# Patient Record
Sex: Male | Born: 1960
Health system: Southern US, Community
[De-identification: ages and names within clinical notes are randomized; demographics above are authoritative.]

## PROBLEM LIST (undated history)

## (undated) DIAGNOSIS — B9562 Methicillin resistant Staphylococcus aureus infection as the cause of diseases classified elsewhere: Secondary | ICD-10-CM

## (undated) DIAGNOSIS — I10 Essential (primary) hypertension: Secondary | ICD-10-CM

## (undated) DIAGNOSIS — L0390 Cellulitis, unspecified: Secondary | ICD-10-CM

## (undated) HISTORY — DX: Essential (primary) hypertension: I10

## (undated) HISTORY — DX: Methicillin resistant Staphylococcus aureus infection as the cause of diseases classified elsewhere: B95.62

## (undated) HISTORY — DX: Methicillin resistant Staphylococcus aureus infection as the cause of diseases classified elsewhere: L03.90

## (undated) HISTORY — PX: ROTATOR CUFF REPAIR: SHX139

---

## 2001-08-10 ENCOUNTER — Ambulatory Visit (HOSPITAL_COMMUNITY): Admission: RE | Admit: 2001-08-10 | Discharge: 2001-08-10 | Payer: Self-pay

## 2002-10-17 ENCOUNTER — Encounter: Admission: RE | Admit: 2002-10-17 | Discharge: 2002-10-30 | Payer: Self-pay | Admitting: Family Medicine

## 2010-05-15 ENCOUNTER — Other Ambulatory Visit: Payer: Self-pay | Admitting: Family Medicine

## 2010-05-15 DIAGNOSIS — R7401 Elevation of levels of liver transaminase levels: Secondary | ICD-10-CM

## 2010-05-26 ENCOUNTER — Ambulatory Visit
Admission: RE | Admit: 2010-05-26 | Discharge: 2010-05-26 | Disposition: A | Discharge status: Home / Self Care | Payer: BC Managed Care – PPO | Source: Ambulatory Visit | Attending: Family Medicine | Admitting: Family Medicine

## 2010-05-26 DIAGNOSIS — R7401 Elevation of levels of liver transaminase levels: Secondary | ICD-10-CM

## 2011-07-22 LAB — LIPID PANEL
CHOLESTEROL: 187 mg/dL (ref 0–200)
HDL: 55 mg/dL (ref 35–70)
LDL Cholesterol: 107 mg/dL
TRIGLYCERIDES: 92 mg/dL (ref 40–160)

## 2011-07-22 LAB — CBC AND DIFFERENTIAL
HEMATOCRIT: 44 % (ref 41–53)
HEMOGLOBIN: 14.8 g/dL (ref 13.5–17.5)
WBC: 5 10^3/mL

## 2011-07-22 LAB — BASIC METABOLIC PANEL
BUN: 18 mg/dL (ref 4–21)
Creatinine: 1 mg/dL (ref 0.6–1.3)
Glucose: 104 mg/dL
Potassium: 4.5 mmol/L (ref 3.4–5.3)
Sodium: 137 mmol/L (ref 137–147)

## 2011-07-22 LAB — TSH: TSH: 3.17 u[IU]/mL (ref 0.41–5.90)

## 2011-07-22 LAB — HEPATIC FUNCTION PANEL: ALT: 100 U/L — AB (ref 10–40)

## 2012-02-29 LAB — HEPATIC FUNCTION PANEL
ALK PHOS: 48 U/L (ref 25–125)
ALT: 100 U/L — AB (ref 10–40)
AST: 65 U/L — AB (ref 14–40)
BILIRUBIN, TOTAL: 0.6 mg/dL

## 2012-02-29 LAB — BASIC METABOLIC PANEL: GLUCOSE: 84 mg/dL

## 2012-11-02 LAB — TSH: TSH: 3.88 u[IU]/mL (ref 0.41–5.90)

## 2012-11-02 LAB — CBC AND DIFFERENTIAL
HCT: 43 % (ref 41–53)
HEMOGLOBIN: 14.9 g/dL (ref 13.5–17.5)
Platelets: 318 10*3/uL (ref 150–399)
WBC: 6.1 10^3/mL

## 2012-11-02 LAB — BASIC METABOLIC PANEL
BUN: 16 mg/dL (ref 4–21)
CREATININE: 1.1 mg/dL (ref 0.6–1.3)
GLUCOSE: 118 mg/dL
POTASSIUM: 4.1 mmol/L (ref 3.4–5.3)
SODIUM: 131 mmol/L — AB (ref 137–147)

## 2012-11-02 LAB — HEPATIC FUNCTION PANEL
ALT: 100 U/L — AB (ref 10–40)
AST: 100 U/L — AB (ref 14–40)
Alkaline Phosphatase: 63 U/L (ref 25–125)
Bilirubin, Total: 1.5 mg/dL

## 2012-11-02 LAB — LIPID PANEL
CHOLESTEROL: 209 mg/dL — AB (ref 0–200)
HDL: 76 mg/dL — AB (ref 35–70)
LDL Cholesterol: 109 mg/dL
Triglycerides: 122 mg/dL (ref 40–160)

## 2014-04-30 LAB — BASIC METABOLIC PANEL
BUN: 17 mg/dL (ref 4–21)
CREATININE: 1.2 mg/dL (ref 0.6–1.3)
GLUCOSE: 106 mg/dL
POTASSIUM: 4.7 mmol/L (ref 3.4–5.3)
SODIUM: 137 mmol/L (ref 137–147)

## 2014-04-30 LAB — LIPID PANEL
CHOLESTEROL: 177 mg/dL (ref 0–200)
HDL: 39 mg/dL (ref 35–70)
LDL Cholesterol: 118 mg/dL
Triglycerides: 98 mg/dL (ref 40–160)

## 2014-04-30 LAB — HEPATIC FUNCTION PANEL
ALT: 100 U/L — AB (ref 10–40)
AST: 80 U/L — AB (ref 14–40)
Alkaline Phosphatase: 81 U/L (ref 25–125)
Bilirubin, Total: 0.6 mg/dL

## 2014-04-30 LAB — HEMOGLOBIN A1C: HEMOGLOBIN A1C: 5.5

## 2015-04-23 DIAGNOSIS — L728 Other follicular cysts of the skin and subcutaneous tissue: Secondary | ICD-10-CM | POA: Diagnosis not present

## 2015-04-23 DIAGNOSIS — L080 Pyoderma: Secondary | ICD-10-CM | POA: Diagnosis not present

## 2015-06-11 DIAGNOSIS — L080 Pyoderma: Secondary | ICD-10-CM | POA: Diagnosis not present

## 2015-07-16 DIAGNOSIS — A4902 Methicillin resistant Staphylococcus aureus infection, unspecified site: Secondary | ICD-10-CM | POA: Diagnosis not present

## 2015-08-26 ENCOUNTER — Encounter: Payer: Self-pay | Admitting: Infectious Diseases

## 2015-08-26 ENCOUNTER — Ambulatory Visit (INDEPENDENT_AMBULATORY_CARE_PROVIDER_SITE_OTHER): Payer: BLUE CROSS/BLUE SHIELD | Admitting: Infectious Diseases

## 2015-08-26 DIAGNOSIS — A4902 Methicillin resistant Staphylococcus aureus infection, unspecified site: Secondary | ICD-10-CM

## 2015-08-26 DIAGNOSIS — I10 Essential (primary) hypertension: Secondary | ICD-10-CM

## 2015-08-26 MED ORDER — MUPIROCIN 2 % EX OINT: TOPICAL_OINTMENT | Freq: Two times a day (BID) | CUTANEOUS | Status: DC

## 2015-08-26 MED ORDER — DOXYCYCLINE HYCLATE 100 MG PO TABS: 100.0000 mg | ORAL_TABLET | Freq: Two times a day (BID) | ORAL | Status: AC

## 2015-08-26 NOTE — Assessment & Plan Note (Signed)
He appears to be doing well.  He has changes consistent with acne on his back- we discussed this.  With regards to his MRSA colonization, he will take mupirocin for the first 5 days of every month for the next 6 months, intranasal.  Will give him 2 weeks of doxy (he is aware of sunburn precautions).  I advised him to use a good antibacterial soap (safegaurd, lever 2000, ect). We could consider hibiclens however it may be a bit too strong. Could reconsider if he does not improve.  We spoke about not sharing towels, linens.  We spoke of good hygeine.  We spoke of being careful re: hand hygeine, sharing linens in the gym.  We spoke of washing his clothes in hot water.  We spoke about possible systemic involvement- he has no evidence of this (no fever or chills, his shoulder is working normally and there is no issues on exam- no crepitance, pain or limitation of motion). I offered BCx but let him know the likelihood of this was extremely low, he defered.  Will see him back as needed.

## 2015-08-26 NOTE — Progress Notes (Signed)
   Subjective:    Patient ID: Keith Bullock, male    DOB: 08/26/1960, 55 y.o.   MRN: [id number]  HPI 55 yo M with hx of L shoulder surgery 2.5 years ago.  Developed acne after this (forehead and back).  He had a MRSA abscess lanced around his beltline march/april 04-25-15. He took doxy for 2 weeks. Afte3r stopping this the acne returned. He then took doxy/rifampin for 2 weeks and had return of acne. He then took clinda/doxy for 2 weeks which gave him further improvement. He has been off this since June 11.  Soap- uses shampoo.  Was given mupirocin, used nightly. Has been off for several weeks.  No recent med changes. . Has 2 cats and a dog for many years. No new lotions, cream, sunscreen.   PMHx- denies (note HTN) Allergies- none Soc- 2-3 drinks/day. No tobacco.  FHx-Father died of colon Ca at 42 yo Apr 25, 1995), Mother died from ovarian caner 04/24/00).  Has had colonoscopy, 5 yrs.   The past medical history, family history and social history were reviewed/updated in EPIC  Review of Systems  Constitutional: Negative for appetite change, chills, fever and unexpected weight change.  Respiratory: Negative for cough and shortness of breath.   Gastrointestinal: Negative for constipation and diarrhea.  Genitourinary: Negative for difficulty urinating.  Musculoskeletal: Negative for joint swelling.   Shoulder is fine, no problems. Normal ROM.     Objective:   Physical Exam  Constitutional: He appears well-developed and well-nourished.  HENT:  Mouth/Throat: No oropharyngeal exudate.  Eyes: EOM are normal. Pupils are equal, round, and reactive to light.  Neck: Neck supple.  Cardiovascular: Normal rate, regular rhythm and normal heart sounds.   Pulmonary/Chest: Effort normal and breath sounds normal.  Abdominal: Soft. Bowel sounds are normal. There is no tenderness. There is no rebound.  Musculoskeletal: He exhibits no edema.  Lymphadenopathy:    He has no cervical adenopathy.  Skin: Skin is  warm and dry.             Assessment & Plan:

## 2015-08-27 ENCOUNTER — Other Ambulatory Visit: Payer: Self-pay | Admitting: *Deleted

## 2015-10-09 DIAGNOSIS — K635 Polyp of colon: Secondary | ICD-10-CM | POA: Diagnosis not present

## 2015-10-09 DIAGNOSIS — K5900 Constipation, unspecified: Secondary | ICD-10-CM | POA: Diagnosis not present

## 2015-10-22 DIAGNOSIS — L080 Pyoderma: Secondary | ICD-10-CM | POA: Diagnosis not present

## 2015-10-22 DIAGNOSIS — L089 Local infection of the skin and subcutaneous tissue, unspecified: Secondary | ICD-10-CM | POA: Diagnosis not present

## 2015-10-22 DIAGNOSIS — L739 Follicular disorder, unspecified: Secondary | ICD-10-CM | POA: Diagnosis not present

## 2015-11-13 DIAGNOSIS — K640 First degree hemorrhoids: Secondary | ICD-10-CM | POA: Diagnosis not present

## 2015-11-13 DIAGNOSIS — R194 Change in bowel habit: Secondary | ICD-10-CM | POA: Diagnosis not present

## 2015-11-13 DIAGNOSIS — Z86010 Personal history of colonic polyps: Secondary | ICD-10-CM | POA: Diagnosis not present

## 2015-11-13 DIAGNOSIS — Z800 Family history of malignant neoplasm of digestive organs: Secondary | ICD-10-CM | POA: Diagnosis not present

## 2015-11-13 LAB — HM COLONOSCOPY

## 2016-01-07 ENCOUNTER — Encounter: Payer: Self-pay | Admitting: Family Medicine

## 2016-01-07 ENCOUNTER — Ambulatory Visit (INDEPENDENT_AMBULATORY_CARE_PROVIDER_SITE_OTHER): Payer: BLUE CROSS/BLUE SHIELD | Admitting: Family Medicine

## 2016-01-07 VITALS — BP 139/88 | HR 95 | Ht 70.0 in | Wt 214.2 lb

## 2016-01-07 DIAGNOSIS — N521 Erectile dysfunction due to diseases classified elsewhere: Secondary | ICD-10-CM

## 2016-01-07 DIAGNOSIS — E785 Hyperlipidemia, unspecified: Secondary | ICD-10-CM | POA: Insufficient documentation

## 2016-01-07 DIAGNOSIS — E669 Obesity, unspecified: Secondary | ICD-10-CM

## 2016-01-07 DIAGNOSIS — R972 Elevated prostate specific antigen [PSA]: Secondary | ICD-10-CM | POA: Diagnosis not present

## 2016-01-07 DIAGNOSIS — E781 Pure hyperglyceridemia: Secondary | ICD-10-CM | POA: Diagnosis not present

## 2016-01-07 DIAGNOSIS — A4902 Methicillin resistant Staphylococcus aureus infection, unspecified site: Secondary | ICD-10-CM

## 2016-01-07 DIAGNOSIS — R6882 Decreased libido: Secondary | ICD-10-CM | POA: Insufficient documentation

## 2016-01-07 DIAGNOSIS — Z8739 Personal history of other diseases of the musculoskeletal system and connective tissue: Secondary | ICD-10-CM

## 2016-01-07 DIAGNOSIS — F1010 Alcohol abuse, uncomplicated: Secondary | ICD-10-CM | POA: Insufficient documentation

## 2016-01-07 DIAGNOSIS — Z87828 Personal history of other (healed) physical injury and trauma: Secondary | ICD-10-CM | POA: Insufficient documentation

## 2016-01-07 DIAGNOSIS — M791 Myalgia, unspecified site: Secondary | ICD-10-CM

## 2016-01-07 DIAGNOSIS — E782 Mixed hyperlipidemia: Secondary | ICD-10-CM | POA: Diagnosis not present

## 2016-01-07 DIAGNOSIS — T466X5A Adverse effect of antihyperlipidemic and antiarteriosclerotic drugs, initial encounter: Secondary | ICD-10-CM | POA: Insufficient documentation

## 2016-01-07 DIAGNOSIS — N529 Male erectile dysfunction, unspecified: Secondary | ICD-10-CM | POA: Insufficient documentation

## 2016-01-07 DIAGNOSIS — I10 Essential (primary) hypertension: Secondary | ICD-10-CM

## 2016-01-07 MED ORDER — TRAMADOL HCL 50 MG PO TABS: 50.0000 mg | ORAL_TABLET | Freq: Four times a day (QID) | ORAL | 0 refills | Status: DC | PRN

## 2016-01-07 MED ORDER — CYCLOBENZAPRINE HCL 10 MG PO TABS: 10.0000 mg | ORAL_TABLET | Freq: Three times a day (TID) | ORAL | 0 refills | Status: DC | PRN

## 2016-01-07 NOTE — Assessment & Plan Note (Signed)
Prostate BX doen earlier this year- was negative.  2-3 times at night he goes--> Urologist txing him.  Didn't think he needed meds for this.

## 2016-01-07 NOTE — Assessment & Plan Note (Addendum)
Played golf yesterday - 2-3 hrs after end of game--> muscles tight in R back -radiating around to front- anterior R chest wall pain.  Worst this am when got out of bed.  Hurts to lift grandson.  No L sided and not worse with walking/ exertion- just use of muscles make it W.  - In past prior PCP gave him tramadol and never used m. Relaxors.  Worked well---> only needed #30 of tramadol lasted him all yr per pt.

## 2016-01-07 NOTE — Assessment & Plan Note (Signed)
Sildenafil by Urology - pt has not gone back for f/up

## 2016-01-07 NOTE — Progress Notes (Signed)
New patient office visit note:  Impression and Recommendations:    1. H/o back strain - M-SK and rad to sides/ front   2. Elevated PSA measurement   3. Hypertriglyceridemia   4. Mixed hyperlipidemia   5. Decreased libido   6. Erectile dysfunction due to diseases classified elsewhere   7. Essential hypertension   8. MRSA infection   9. Myalgia due to statin   10. Excessive drinking of alcohol   11. Obesity, Class I, BMI 30-34.9     Elevated PSA measurement Prostate BX doen earlier this year- was negative.  2-3 times at night he goes--> Urologist txing him.  Didn't think he needed meds for this.   Hypertriglyceridemia Zetia 3 yrs ago-->  Was on that and lipitor in past--> went off lipitor b/c jt pains/ muscles.  Went off zetia about 1-2 yrs ago b/c was told TG were WNL:'s.    ED (erectile dysfunction) Sildenafil by Urology - pt has not gone back for f/up  h/o recent MRSA infection Office Visit (08/26/2015)  - Keith Riches, MD - of Infectious Disease   He appears to be doing well.  He has changes consistent with acne on his back- we discussed this.  With regards to his MRSA colonization, he will take mupirocin for the first 5 days of every month for the next 6 months, intranasal.  Will give him 2 weeks of doxy (he is aware of sunburn precautions).  I advised him to use a good antibacterial soap (safegaurd, lever 2000, ect). We could consider hibiclens however it may be a bit too strong. Could reconsider if he does not improve.  We spoke about not sharing towels, linens.  We spoke of good hygeine.  We spoke of being careful re: hand hygeine, sharing linens in the gym.  We spoke of washing his clothes in hot water.  We spoke about possible systemic involvement- he has no evidence of this (no fever or chills, his shoulder is working normally and there is no issues on exam- no crepitance, pain or limitation of motion). I offered BCx but let him know the likelihood of  this was extremely low, he defered.  Will see him back as needed.       H/o back strain - M-SK and rad to sides/ front Played golf yesterday - 2-3 hrs after end of game--> muscles tight in R back -radiating around to front- anterior R chest wall pain.  Worst this am when got out of bed.  Hurts to lift grandson.  No L sided and not worse with walking/ exertion- just use of muscles make it W.  - In past prior PCP gave him tramadol and never used m. Relaxors.  Worked well---> only needed #30 of tramadol lasted him all yr per pt.   Excessive drinking of alcohol 4-8 drinks per day, maybe more weekends or whatever.  Wife wants him to cut back  Advised pt to cut back   LFT's were elevated in past - thought to be due to his ETOH abuse/ overuse  Obesity, Class I, BMI 30-34.9 Counseling done- advised wt loss.  Discussed with patient importance of weight loss to help achieve health goals and how increasing weight, correlates to increasing risk of disease. (or increasing risk of not controlling existing diseases.)  Essential hypertension Goal BP of 130/80 or less r/w pt.    Lifestyle changes such as dash diet and engaging in a regular exercise program discussed  with patient.  Educational handouts provided  Ambulatory BP monitoring encouraged. Keep log and bring in next OV  Continue current medication(s).   Also, risks and benefits of medications discussed with patient, including alternative treatments.   Encouraged patient to read drug information handouts to further educate self about the medicine prior to starting it.   Contact us prior with any Q's/ concerns.  Myalgia due to statin R/b meds d/c pt and counseling done re: disease process  All questions answered  Handouts given if patient desired them     Orders Placed This Encounter  Procedures  . Lipid Panel w/reflex Direct LDL  . Hemoglobin A1c  . COMPLETE METABOLIC PANEL WITH GFR  . CBC with Differential/Platelet  . TSH    . VITAMIN D 25 Hydroxy (Vit-D Deficiency, Fractures)  . Magnesium  . Phosphorus     New Prescriptions   CYCLOBENZAPRINE (FLEXERIL) 10 MG TABLET    Take 1 tablet (10 mg total) by mouth 3 (three) times daily as needed for muscle spasms.    Modified Medications   Modified Medication Previous Medication   TRAMADOL (ULTRAM) 50 MG TABLET traMADol (ULTRAM) 50 MG tablet      Take 1 tablet (50 mg total) by mouth every 6 (six) hours as needed.    Take 50 mg by mouth every 6 (six) hours as needed.    Discontinued Medications   AMLODIPINE-VALSARTAN-HCTZ 10-320-25 MG TABS    Take 1 tablet by mouth daily.     The patient was counseled, risk factors were discussed, anticipatory guidance given.  Gross side effects, risk and benefits, and alternatives of medications discussed with patient.  Patient is aware that all medications have potential side effects and we are unable to predict every side effect or drug-drug interaction that may occur.  Expresses verbal understanding and consents to current therapy plan and treatment regimen.  Return for RTC depending on bldwrk results may need OV sooner, but plan 29mo f/up .   Pt was not Completely fasting today- had a little cream in his coffee this am only- no sugar.  Please see AVS handed out to patient at the end of our visit for further patient instructions/ counseling done pertaining to today's office visit.    Note: This document was prepared using Dragon voice recognition software and may include unintentional dictation errors.  --------------------------------------------------------------------------------------------------------------------    Subjective:    Chief Complaint  Patient presents with  . Establish Care    HPI: Keith Bullock is a pleasant 55 y.o. male who presents to [facility] today to review their medical history with me and establish care.   I asked the patient to review their chronic  problem list with me to ensure everything was updated and accurate.    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 -It was a "life changing event" when he lost Dad to colon ca 1997 and Mom- ovarian Ca 2002- was difficult and depressed for many yrs.  Couple wks ago- saw a counselor/ life coach which helped him gain perspective.   5 grandsons- all in [place]  Enjoys golf.  Enjoys granchildren  Please see problem list for specifics about each condition and history of those disease processes   Wt Readings from Last 3 Encounters:  01/07/16 214 lb 3.2 oz (97.2 kg)  08/26/15 206 lb 8 oz (93.7 kg)   BP Readings from Last 3 Encounters:  01/07/16 139/88  08/26/15 122/79   Pulse Readings from Last 3 Encounters:  01/07/16 95  08/26/15 91   BMI Readings from Last 3 Encounters:  01/07/16 30.73 kg/m  08/26/15 29.63 kg/m    Patient Care Team    Relationship Specialty Notifications Start End  Mellody Dance, DO PCP - General Family Medicine  01/07/16   Wilford Corner, MD Consulting Physician Gastroenterology  01/07/16   Warren Danes, PA-C Physician Assistant Dermatology  01/07/16   Carolan Clines, MD Consulting Physician Urology  01/07/16   Keith Riches, MD Consulting Physician Infectious Diseases  01/07/16     Patient Active Problem List   Diagnosis Date Noted  . Hypertriglyceridemia 01/07/2016    Priority: High  . HLD (hyperlipidemia) 01/07/2016    Priority: High  . Essential hypertension 08/26/2015    Priority: High  . H/o back strain - M-SK and rad to sides/ front 01/07/2016    Priority: Medium  . Excessive drinking of alcohol 01/07/2016    Priority: Medium  . Obesity, Class I, BMI 30-34.9 01/07/2016    Priority: Medium  . h/o recent MRSA infection 08/26/2015    Priority: Medium  . Elevated PSA measurement 01/07/2016    Priority: Low  . Decreased libido 01/07/2016    Priority: Low  . ED (erectile dysfunction) 01/07/2016     Priority: Low  . Myalgia due to statin 01/07/2016     Past Medical History:  Diagnosis Date  . Hypertension   . MRSA cellulitis      Past Medical History:  Diagnosis Date  . Hypertension   . MRSA cellulitis      Past Surgical History:  Procedure Laterality Date  . ROTATOR CUFF REPAIR Left      Family History  Problem Relation Age of Onset  . Cancer Mother     Ovarian  . Depression Mother   . Hypertension Mother   . Cancer Father     Colon  . Diabetes Paternal Grandfather      History  Drug Use No    History  Alcohol Use  . Yes    Comment: 2-3 a day    History  Smoking Status  . Never Smoker  Smokeless Tobacco  . Never Used     Patient's Medications  New Prescriptions   CYCLOBENZAPRINE (FLEXERIL) 10 MG TABLET    Take 1 tablet (10 mg total) by mouth 3 (three) times daily as needed for muscle spasms.  Previous Medications   AMLODIPINE (NORVASC) 10 MG TABLET    Take 1 tablet by mouth daily.   DOXYCYCLINE (VIBRAMYCIN) 50 MG CAPSULE    Take 50 mg by mouth 2 (two) times daily.   NAPROXEN SODIUM (ANAPROX) 220 MG TABLET    Take 220 mg by mouth as needed.   VALSARTAN-HYDROCHLOROTHIAZIDE (DIOVAN-HCT) 320-25 MG TABLET    Take 1 tablet by mouth daily.  Modified Medications   Modified Medication Previous Medication   TRAMADOL (ULTRAM) 50 MG TABLET traMADol (ULTRAM) 50 MG tablet      Take 1 tablet (50 mg total) by mouth every 6 (six) hours as needed.    Take 50 mg by mouth every 6 (six) hours as needed.  Discontinued Medications   AMLODIPINE-VALSARTAN-HCTZ 10-320-25 MG TABS    Take 1 tablet by mouth daily.    Allergies: Patient has no known allergies.  Review of Systems  Constitutional: Negative for chills and fever.  Respiratory: Negative for shortness of breath.   Cardiovascular: Negative for chest pain and palpitations.  Gastrointestinal: Negative for nausea and vomiting.  Genitourinary: Positive for frequency. Negative for dysuria.  Neurological:  Negative for dizziness and headaches.     Objective:    Blood pressure 139/88, pulse 95, height 5\' 10"  (1.778 m), weight 214 lb 3.2 oz (97.2 kg), SpO2 97 %. Body mass index is 30.73 kg/m. General: Well Developed, well nourished, and in no acute distress.  Neuro: Alert and oriented x3, extra-ocular muscles intact, sensation grossly intact.  HEENT: Normocephalic, atraumatic, pupils equal round reactive to light, neck supple. No Bruits Skin: no gross rashes  Cardiac: Regular rate and rhythm Respiratory: Essentially clear to auscultation bilaterally. Not using accessory muscles, speaking in full sentences.  Abdominal: not grossly distended Musculoskeletal: Ambulates w/o diff, FROM * 4 ext. No palpable muscle contusion/ spasms apprec R chest wall- b/l t4-8 paravert muscle tightness b/l back Vasc: less 2 sec cap RF, warm and pink  Psych:  No HI/SI, judgement and insight good, Euthymic mood. Full Affect.

## 2016-01-07 NOTE — Assessment & Plan Note (Signed)
Counseling done- advised wt loss.  Discussed with patient importance of weight loss to help achieve health goals and how increasing weight, correlates to increasing risk of disease. (or increasing risk of not controlling existing diseases.) 

## 2016-01-07 NOTE — Assessment & Plan Note (Addendum)
Office Visit (08/26/2015)  - [name], MD - of Infectious Disease   He appears to be doing well.  He has changes consistent with acne on his back- we discussed this.  With regards to his MRSA colonization, he will take mupirocin for the first 5 days of every month for the next 6 months, intranasal.  Will give him 2 weeks of doxy (he is aware of sunburn precautions).  I advised him to use a good antibacterial soap (safegaurd, lever 2000, ect). We could consider hibiclens however it may be a bit too strong. Could reconsider if he does not improve.  We spoke about not sharing towels, linens.  We spoke of good hygeine.  We spoke of being careful re: hand hygeine, sharing linens in the gym.  We spoke of washing his clothes in hot water.  We spoke about possible systemic involvement- he has no evidence of this (no fever or chills, his shoulder is working normally and there is no issues on exam- no crepitance, pain or limitation of motion). I offered BCx but let him know the likelihood of this was extremely low, he defered.  Will see him back as needed.

## 2016-01-07 NOTE — Assessment & Plan Note (Signed)
R/b meds d/c pt and counseling done re: disease process  All questions answered  Handouts given if patient desired them

## 2016-01-07 NOTE — Assessment & Plan Note (Addendum)
4-8 drinks per day, maybe more weekends or whatever.  Wife wants him to cut back  Advised pt to cut back   LFT's were elevated in past - thought to be due to his ETOH abuse/ overuse

## 2016-01-07 NOTE — Assessment & Plan Note (Signed)
Goal BP of 130/80 or less r/w pt.    Lifestyle changes such as dash diet and engaging in a regular exercise program discussed with patient.  Educational handouts provided  Ambulatory BP monitoring encouraged. Keep log and bring in next OV  Continue current medication(s).   Also, risks and benefits of medications discussed with patient, including alternative treatments.   Encouraged patient to read drug information handouts to further educate self about the medicine prior to starting it.   Contact us prior with any Q's/ concerns.

## 2016-01-07 NOTE — Patient Instructions (Addendum)
Lifestyle changes such as dash diet-for HTN and engaging in a regular exercise program discussed with patient.     Educational handouts provided  Ambulatory BP monitoring encouraged. Keep log and bring in next OV!! Goal is Bp less than 130/80 each time. If remains above that, then will need  Continue current medication(s).     Contact us prior with any Q's/ concerns.   Muscle Pain, Adult Muscle pain (myalgia) may be mild or severe. In most cases, the pain lasts only a short time and it goes away without treatment. It is normal to feel some muscle pain after starting a workout program. Muscles that have not been used often will be sore at first. Muscle pain may also be caused by many other things, including:  Overuse or muscle strain, especially if you are not in shape. This is the most common cause of muscle pain.  Injury.  Bruises.  Viruses, such as the flu.  Infectious diseases.  A chronic condition that causes muscle tenderness, fatigue, and headache (fibromyalgia).  A condition, such as lupus, in which the body's disease-fighting system attacks other organs in the body (autoimmune or rheumatologic diseases).  Certain drugs, including ACE inhibitors and statins. To diagnose the cause of your muscle pain, your health care provider will do a physical exam and ask questions about the pain and when it began. If you have not had muscle pain for very long, your health care provider may want to wait before doing much testing. If your muscle pain has lasted a long time, your health care provider may want to run tests right away. In some cases, this may include tests to rule out certain conditions or illnesses. Treatment for muscle pain depends on the cause. Home care is often enough to relieve muscle pain. Your health care provider may also prescribe anti-inflammatory medicine. Follow these instructions at home: Activity  If overuse is causing your muscle pain:  Slow down your  activities until the pain goes away.  Do regular, gentle exercises if you are not usually active.  Warm up before exercising. Stretch before and after exercising. This can help lower the risk of muscle pain.  Do not continue working out if the pain is very bad. Bad pain could mean that you have injured a muscle. Managing pain and discomfort  If directed, apply ice to the sore muscle:  Put ice in a plastic bag.  Place a towel between your skin and the bag.  Leave the ice on for 20 minutes, 2-3 times a day.  You may also alternate between applying ice and applying heat as told by your health care provider. To apply heat, use the heat source that your health care provider recommends, such as a moist heat pack or a heating pad.  Place a towel between your skin and the heat source.  Leave the heat on for 20-30 minutes.  Remove the heat if your skin turns bright red. This is especially important if you are unable to feel pain, heat, or cold. You may have a greater risk of getting burned. Medicines  Take over-the-counter and prescription medicines only as told by your health care provider.  Do not drive or use heavy machinery while taking prescription pain medicine. Contact a health care provider if:  Your muscle pain gets worse and medicines do not help.  You have muscle pain that lasts longer than 3 days.  You have a rash or fever along with muscle pain.  You have muscle pain  after a tick bite.  You have muscle pain while working out, even though you are in good physical condition.  You have redness, soreness, or swelling along with muscle pain.  You have muscle pain after starting a new medicine or changing the dose of a medicine. Get help right away if:  You have trouble breathing.  You have trouble swallowing.  You have muscle pain along with a stiff neck, fever, and vomiting.  You have severe muscle weakness or cannot move part of your body. This information is not  intended to replace advice given to you by your health care provider. Make sure you discuss any questions you have with your health care provider. Document Released: 11/27/2005 Document Revised: 07/26/2015 Document Reviewed: 05/28/2015 Elsevier Interactive Patient Education  2017 [employer].

## 2016-01-07 NOTE — Assessment & Plan Note (Signed)
Zetia 3 yrs ago-->  Was on that and lipitor in past--> went off lipitor b/c jt pains/ muscles.  Went off zetia about 1-2 yrs ago b/c was told TG were WNL:'s.

## 2016-01-08 LAB — LIPID PANEL W/REFLEX DIRECT LDL
Cholesterol: 207 mg/dL — ABNORMAL HIGH (ref <200)
HDL: 53 mg/dL (ref >40)
LDL-Cholesterol: 123 mg/dL — ABNORMAL HIGH
Non-HDL Cholesterol (Calc): 154 mg/dL — ABNORMAL HIGH (ref <130)
Total CHOL/HDL Ratio: 3.9 Ratio (ref <5.0)
Triglycerides: 191 mg/dL — ABNORMAL HIGH (ref <150)

## 2016-01-08 LAB — CBC WITH DIFFERENTIAL/PLATELET
BASOS ABS: 98 {cells}/uL (ref 0–200)
Basophils Relative: 2 %
EOS PCT: 2 %
Eosinophils Absolute: 98 cells/uL (ref 15–500)
HEMATOCRIT: 44.5 % (ref 38.5–50.0)
HEMOGLOBIN: 15.1 g/dL (ref 13.2–17.1)
LYMPHS ABS: 1813 {cells}/uL (ref 850–3900)
LYMPHS PCT: 37 %
MCH: 32.3 pg (ref 27.0–33.0)
MCHC: 33.9 g/dL (ref 32.0–36.0)
MCV: 95.1 fL (ref 80.0–100.0)
MPV: 9.3 fL (ref 7.5–12.5)
Monocytes Absolute: 588 cells/uL (ref 200–950)
Monocytes Relative: 12 %
NEUTROS PCT: 47 %
Neutro Abs: 2303 cells/uL (ref 1500–7800)
Platelets: 263 10*3/uL (ref 140–400)
RBC: 4.68 MIL/uL (ref 4.20–5.80)
RDW: 13.5 % (ref 11.0–15.0)
WBC: 4.9 10*3/uL (ref 3.8–10.8)

## 2016-01-08 LAB — COMPLETE METABOLIC PANEL WITH GFR
ALK PHOS: 64 U/L (ref 40–115)
ALT: 82 U/L — AB (ref 9–46)
AST: 70 U/L — ABNORMAL HIGH (ref 10–35)
Albumin: 4.3 g/dL (ref 3.6–5.1)
BILIRUBIN TOTAL: 0.8 mg/dL (ref 0.2–1.2)
BUN: 17 mg/dL (ref 7–25)
CALCIUM: 9.1 mg/dL (ref 8.6–10.3)
CO2: 24 mmol/L (ref 20–31)
CREATININE: 1.05 mg/dL (ref 0.70–1.33)
Chloride: 102 mmol/L (ref 98–110)
GFR, EST NON AFRICAN AMERICAN: 80 mL/min (ref >=60)
Glucose, Bld: 90 mg/dL (ref 65–99)
Potassium: 4.4 mmol/L (ref 3.5–5.3)
Sodium: 137 mmol/L (ref 135–146)
TOTAL PROTEIN: 7.2 g/dL (ref 6.1–8.1)

## 2016-01-08 LAB — HEMOGLOBIN A1C
Hgb A1c MFr Bld: 5.6 % (ref <5.7)
Mean Plasma Glucose: 114 mg/dL

## 2016-01-08 LAB — VITAMIN D 25 HYDROXY (VIT D DEFICIENCY, FRACTURES): Vit D, 25-Hydroxy: 39 ng/mL (ref 30–100)

## 2016-01-08 LAB — PHOSPHORUS: Phosphorus: 2.7 mg/dL (ref 2.5–4.5)

## 2016-01-08 LAB — TSH: TSH: 4.73 mIU/L — ABNORMAL HIGH (ref 0.40–4.50)

## 2016-01-08 LAB — MAGNESIUM: Magnesium: 1.9 mg/dL (ref 1.5–2.5)

## 2016-02-14 ENCOUNTER — Telehealth: Payer: Self-pay

## 2016-02-14 MED ORDER — ATORVASTATIN CALCIUM 40 MG PO TABS: ORAL_TABLET | ORAL | 0 refills | Status: DC

## 2016-02-14 NOTE — Telephone Encounter (Signed)
Pt called stating that he is back in town and would now like to start Lipitor.  RX sent to pharmacy.  Advised pt that he needs blood work next work for repeat TSH.  Pt expressed understanding and is agreeable.  Charyl Bigger, CMA

## 2016-02-19 ENCOUNTER — Other Ambulatory Visit: Payer: Self-pay | Admitting: Family Medicine

## 2016-02-20 ENCOUNTER — Other Ambulatory Visit (INDEPENDENT_AMBULATORY_CARE_PROVIDER_SITE_OTHER): Payer: BLUE CROSS/BLUE SHIELD

## 2016-02-20 DIAGNOSIS — R7989 Other specified abnormal findings of blood chemistry: Secondary | ICD-10-CM

## 2016-02-20 DIAGNOSIS — Z1159 Encounter for screening for other viral diseases: Secondary | ICD-10-CM

## 2016-02-20 DIAGNOSIS — I10 Essential (primary) hypertension: Secondary | ICD-10-CM | POA: Diagnosis not present

## 2016-02-20 DIAGNOSIS — R748 Abnormal levels of other serum enzymes: Secondary | ICD-10-CM

## 2016-02-21 LAB — COMPREHENSIVE METABOLIC PANEL
ALBUMIN: 4.3 g/dL (ref 3.5–5.5)
ALT: 155 IU/L — ABNORMAL HIGH (ref 0–44)
AST: 131 IU/L — ABNORMAL HIGH (ref 0–40)
Albumin/Globulin Ratio: 1.6 (ref 1.2–2.2)
Alkaline Phosphatase: 78 IU/L (ref 39–117)
BUN/Creatinine Ratio: 15 (ref 9–20)
BUN: 17 mg/dL (ref 6–24)
Bilirubin Total: 0.9 mg/dL (ref 0.0–1.2)
CO2: 27 mmol/L (ref 18–29)
CREATININE: 1.13 mg/dL (ref 0.76–1.27)
Calcium: 9.2 mg/dL (ref 8.7–10.2)
Chloride: 95 mmol/L — ABNORMAL LOW (ref 96–106)
GFR calc Af Amer: 84 mL/min/{1.73_m2} (ref >59)
GFR calc non Af Amer: 73 mL/min/{1.73_m2} (ref >59)
GLOBULIN, TOTAL: 2.7 g/dL (ref 1.5–4.5)
Glucose: 99 mg/dL (ref 65–99)
Potassium: 4.8 mmol/L (ref 3.5–5.2)
SODIUM: 135 mmol/L (ref 134–144)
TOTAL PROTEIN: 7 g/dL (ref 6.0–8.5)

## 2016-02-21 LAB — HEPATITIS C ANTIBODY: Hep C Virus Ab: <0.1 s/co ratio (ref 0.0–0.9)

## 2016-02-21 LAB — T4, FREE: FREE T4: 1.26 ng/dL (ref 0.82–1.77)

## 2016-02-21 LAB — TSH: TSH: 3.1 u[IU]/mL (ref 0.450–4.500)

## 2016-02-26 ENCOUNTER — Other Ambulatory Visit: Payer: Self-pay

## 2016-02-26 ENCOUNTER — Other Ambulatory Visit: Payer: Self-pay | Admitting: Adult Health

## 2016-02-26 DIAGNOSIS — E782 Mixed hyperlipidemia: Secondary | ICD-10-CM

## 2016-02-26 DIAGNOSIS — R748 Abnormal levels of other serum enzymes: Secondary | ICD-10-CM

## 2016-02-26 DIAGNOSIS — E7800 Pure hypercholesterolemia, unspecified: Secondary | ICD-10-CM

## 2016-02-26 MED ORDER — ATORVASTATIN CALCIUM 10 MG PO TABS: 10.0000 mg | ORAL_TABLET | Freq: Every day | ORAL | 0 refills | Status: DC

## 2016-03-04 ENCOUNTER — Other Ambulatory Visit: Payer: Self-pay

## 2016-03-04 MED ORDER — VALSARTAN-HYDROCHLOROTHIAZIDE 320-25 MG PO TABS: 1.0000 | ORAL_TABLET | Freq: Every day | ORAL | 0 refills | Status: DC

## 2016-03-04 MED ORDER — AMLODIPINE BESYLATE 10 MG PO TABS: 10.0000 mg | ORAL_TABLET | Freq: Every day | ORAL | 0 refills | Status: DC

## 2016-03-26 ENCOUNTER — Telehealth: Payer: Self-pay

## 2016-03-26 ENCOUNTER — Other Ambulatory Visit (INDEPENDENT_AMBULATORY_CARE_PROVIDER_SITE_OTHER): Payer: BLUE CROSS/BLUE SHIELD

## 2016-03-26 DIAGNOSIS — E7800 Pure hypercholesterolemia, unspecified: Secondary | ICD-10-CM

## 2016-03-26 DIAGNOSIS — R748 Abnormal levels of other serum enzymes: Secondary | ICD-10-CM | POA: Diagnosis not present

## 2016-03-26 NOTE — Telephone Encounter (Signed)
Pt stated that the atorvastatin caused muscle pains so he stopped it after 20 days of therapy.  Pt stated that he pains resolved within 4 days of [place] med.  Charyl Bigger, CMA

## 2016-03-26 NOTE — Telephone Encounter (Signed)
Noted.  Pt had labs drawn this morning.  Will await results and response from Lillard Anes, NP.  Charyl Bigger, CMA

## 2016-03-26 NOTE — Telephone Encounter (Signed)
Ok to d/c med. We will see what his lipid looks like and move forward from there. Thanks! Valetta Fuller

## 2016-03-27 LAB — LIPID PANEL
CHOLESTEROL TOTAL: 199 mg/dL (ref 100–199)
Chol/HDL Ratio: 4.5 ratio units (ref 0.0–5.0)
HDL: 44 mg/dL (ref >39)
LDL CALC: 127 mg/dL — AB (ref 0–99)
Triglycerides: 142 mg/dL (ref 0–149)
VLDL Cholesterol Cal: 28 mg/dL (ref 5–40)

## 2016-03-27 LAB — HEPATIC FUNCTION PANEL
ALK PHOS: 76 IU/L (ref 39–117)
ALT: 73 IU/L — ABNORMAL HIGH (ref 0–44)
AST: 60 IU/L — AB (ref 0–40)
Albumin: 4.2 g/dL (ref 3.5–5.5)
Bilirubin Total: 0.5 mg/dL (ref 0.0–1.2)
Bilirubin, Direct: 0.18 mg/dL (ref 0.00–0.40)
TOTAL PROTEIN: 6.4 g/dL (ref 6.0–8.5)

## 2016-03-27 LAB — PLEASE NOTE

## 2016-06-20 ENCOUNTER — Other Ambulatory Visit: Payer: Self-pay | Admitting: Adult Health

## 2016-07-30 DIAGNOSIS — L709 Acne, unspecified: Secondary | ICD-10-CM | POA: Diagnosis not present

## 2016-07-30 DIAGNOSIS — Z79899 Other long term (current) drug therapy: Secondary | ICD-10-CM | POA: Diagnosis not present

## 2016-08-10 ENCOUNTER — Other Ambulatory Visit: Payer: Self-pay | Admitting: Adult Health

## 2016-08-12 ENCOUNTER — Encounter: Payer: Self-pay | Admitting: Family Medicine

## 2016-08-12 ENCOUNTER — Ambulatory Visit: Payer: BLUE CROSS/BLUE SHIELD | Admitting: Family Medicine

## 2016-08-12 VITALS — BP 139/91 | HR 73 | Ht 70.0 in | Wt 211.8 lb

## 2016-08-12 DIAGNOSIS — E782 Mixed hyperlipidemia: Secondary | ICD-10-CM

## 2016-08-12 DIAGNOSIS — N521 Erectile dysfunction due to diseases classified elsewhere: Secondary | ICD-10-CM

## 2016-08-12 DIAGNOSIS — R7989 Other specified abnormal findings of blood chemistry: Secondary | ICD-10-CM

## 2016-08-12 DIAGNOSIS — T466X5A Adverse effect of antihyperlipidemic and antiarteriosclerotic drugs, initial encounter: Secondary | ICD-10-CM

## 2016-08-12 DIAGNOSIS — Z8739 Personal history of other diseases of the musculoskeletal system and connective tissue: Secondary | ICD-10-CM

## 2016-08-12 DIAGNOSIS — M791 Myalgia, unspecified site: Secondary | ICD-10-CM

## 2016-08-12 DIAGNOSIS — E669 Obesity, unspecified: Secondary | ICD-10-CM

## 2016-08-12 DIAGNOSIS — I10 Essential (primary) hypertension: Secondary | ICD-10-CM

## 2016-08-12 DIAGNOSIS — R945 Abnormal results of liver function studies: Secondary | ICD-10-CM

## 2016-08-12 DIAGNOSIS — E781 Pure hyperglyceridemia: Secondary | ICD-10-CM

## 2016-08-12 DIAGNOSIS — R972 Elevated prostate specific antigen [PSA]: Secondary | ICD-10-CM

## 2016-08-12 DIAGNOSIS — E66811 Obesity, class 1: Secondary | ICD-10-CM

## 2016-08-12 DIAGNOSIS — Z87828 Personal history of other (healed) physical injury and trauma: Secondary | ICD-10-CM

## 2016-08-12 DIAGNOSIS — F1010 Alcohol abuse, uncomplicated: Secondary | ICD-10-CM

## 2016-08-12 MED ORDER — CYCLOBENZAPRINE HCL 10 MG PO TABS: 10.0000 mg | ORAL_TABLET | Freq: Three times a day (TID) | ORAL | 0 refills | Status: DC | PRN

## 2016-08-12 MED ORDER — VALSARTAN-HYDROCHLOROTHIAZIDE 320-25 MG PO TABS: 1.0000 | ORAL_TABLET | Freq: Every day | ORAL | 0 refills | Status: DC

## 2016-08-12 MED ORDER — AMLODIPINE BESYLATE 10 MG PO TABS: 10.0000 mg | ORAL_TABLET | Freq: Every day | ORAL | 0 refills | Status: DC

## 2016-08-12 NOTE — Patient Instructions (Addendum)
Please talk to [facility] about obtaining a fasting lipid profile and a CMP in the near future when she obtains labs.  Once I can see those results and you bring them to me we can then discuss putting you on the Zetia as long as her liver functions are okay.  They should be much improved since you  quit drinking..   Please call Dr. Era Bumpers your urologist office about getting in with a new provider.  Dr. Alinda Money is a younger physician there and may have openings or anyone that will take you.  They can follow your PSA and biopsies etc as well as give sildenafil etc.   Please realize, EXERCISE IS MEDICINE!  -  American Heart Association [facility]) guidelines for exercise : If you are in good health, without any medical conditions, you should engage in 150 minutes of moderate intensity aerobic activity per week.  This means you should be huffing and puffing throughout your workout.   Engaging in regular exercise will improve brain function and memory, as well as improve mood, boost immune system and help with weight management.  As well as the other, more well-known effects of exercise such as decreasing blood sugar levels, decreasing blood pressure,  and decreasing bad cholesterol levels/ increasing good cholesterol levels.     -  The AHA strongly endorses consumption of a diet that contains a variety of foods from all the food categories with an emphasis on fruits and vegetables; fat-free and low-fat dairy products; cereal and grain products; legumes and nuts; and fish, poultry, and/or extra lean meats.    Excessive food intake, especially of foods high in saturated and trans fats, sugar, and salt, should be avoided.    Adequate water intake of roughly 1/2 of your weight in pounds, should equal the ounces of water per day you should drink.  So for instance, if you're 200 pounds, that would be 100 ounces of water per day.         Mediterranean Diet  Why follow it? Research shows. . Those who follow  the Mediterranean diet have a reduced risk of heart disease  . The diet is associated with a reduced incidence of Parkinson's and Alzheimer's diseases . People following the diet may have longer life expectancies and lower rates of chronic diseases  . The Dietary Guidelines for Americans recommends the Mediterranean diet as an eating plan to promote health and prevent disease  What Is the Mediterranean Diet?  . Healthy eating plan based on typical foods and recipes of Mediterranean-style cooking . The diet is primarily a plant based diet; these foods should make up a majority of meals   Starches - Plant based foods should make up a majority of meals - They are an important sources of vitamins, minerals, energy, antioxidants, and fiber - Choose whole grains, foods high in fiber and minimally processed items  - Typical grain sources include wheat, oats, barley, corn, brown rice, bulgar, farro, millet, polenta, couscous  - Various types of beans include chickpeas, lentils, fava beans, black beans, white beans   Fruits  Veggies - Large quantities of antioxidant rich fruits & veggies; 6 or more servings  - Vegetables can be eaten raw or lightly drizzled with oil and cooked  - Vegetables common to the traditional Mediterranean Diet include: artichokes, arugula, beets, broccoli, brussel sprouts, cabbage, carrots, celery, collard greens, cucumbers, eggplant, kale, leeks, lemons, lettuce, mushrooms, okra, onions, peas, peppers, potatoes, pumpkin, radishes, rutabaga, shallots, spinach, sweet potatoes, turnips,  zucchini - Fruits common to the Mediterranean Diet include: apples, apricots, avocados, cherries, clementines, dates, figs, grapefruits, grapes, melons, nectarines, oranges, peaches, pears, pomegranates, strawberries, tangerines  Fats - Replace butter and margarine with healthy oils, such as olive oil, canola oil, and tahini  - Limit nuts to no more than a handful a day  - Nuts include walnuts,  almonds, pecans, pistachios, pine nuts  - Limit or avoid candied, honey roasted or heavily salted nuts - Olives are central to the [employer] - can be eaten whole or used in a variety of dishes   Meats Protein - Limiting red meat: no more than a few times a month - When eating red meat: choose lean cuts and keep the portion to the size of deck of cards - Eggs: approx. 0 to 4 times a week  - Fish and lean poultry: at least 2 a week  - Healthy protein sources include, chicken, [place], lean beef, lamb - Increase intake of seafood such as tuna, salmon, trout, mackerel, shrimp, scallops - Avoid or limit high fat processed meats such as sausage and bacon  Dairy - Include moderate amounts of low fat dairy products  - Focus on healthy dairy such as fat free yogurt, skim milk, low or reduced fat cheese - Limit dairy products higher in fat such as whole or 2% milk, cheese, ice cream  Alcohol - Moderate amounts of red wine is ok  - No more than 5 oz daily for women (all ages) and men older than age 65  - No more than 10 oz of wine daily for men younger than 23  Other - Limit sweets and other desserts  - Use herbs and spices instead of salt to flavor foods  - Herbs and spices common to the traditional Mediterranean Diet include: basil, bay leaves, chives, cloves, cumin, fennel, garlic, lavender, marjoram, mint, oregano, parsley, pepper, rosemary, sage, savory, sumac, tarragon, thyme   It's not just a diet, it's a lifestyle:  . The Mediterranean diet includes lifestyle factors typical of those in the region  . Foods, drinks and meals are best eaten with others and savored . Daily physical activity is important for overall good health . This could be strenuous exercise like running and aerobics . This could also be more leisurely activities such as walking, housework, yard-work, or taking the stairs . Moderation is the key; a balanced and healthy diet accommodates most foods and  drinks . Consider portion sizes and frequency of consumption of certain foods   Meal Ideas & Options:  . Breakfast:  o Whole wheat toast or whole wheat English muffins with peanut butter & hard boiled egg o Steel cut oats topped with apples & cinnamon and skim milk  o Fresh fruit: banana, strawberries, melon, berries, peaches  o Smoothies: strawberries, bananas, greek yogurt, peanut butter o Low fat greek yogurt with blueberries and granola  o Egg white omelet with spinach and mushrooms o Breakfast couscous: whole wheat couscous, apricots, skim milk, cranberries  . Sandwiches:  o Hummus and grilled vegetables (peppers, zucchini, squash) on whole wheat bread   o Grilled chicken on whole wheat pita with lettuce, tomatoes, cucumbers or tzatziki  o Tuna salad on whole wheat bread: tuna salad made with greek yogurt, olives, red peppers, capers, green onions o Garlic rosemary lamb pita: lamb sauted with garlic, rosemary, salt & pepper; add lettuce, cucumber, greek yogurt to pita - flavor with lemon juice and black pepper  . Seafood:  o  Mediterranean grilled salmon, seasoned with garlic, basil, parsley, lemon juice and black pepper o Shrimp, lemon, and spinach whole-grain pasta salad made with low fat greek yogurt  o Seared scallops with lemon orzo  o Seared tuna steaks seasoned salt, pepper, coriander topped with tomato mixture of olives, tomatoes, olive oil, minced garlic, parsley, green onions and cappers  . Meats:  o Herbed greek chicken salad with kalamata olives, cucumber, feta  o Red bell peppers stuffed with spinach, bulgur, lean ground beef (or lentils) & topped with feta   o Kebabs: skewers of chicken, tomatoes, onions, zucchini, squash  o [place] burgers: made with red onions, mint, dill, lemon juice, feta cheese topped with roasted red peppers . Vegetarian o Cucumber salad: cucumbers, artichoke hearts, celery, red onion, feta cheese, tossed in olive oil & lemon juice  o Hummus and  whole grain pita points with a greek salad (lettuce, tomato, feta, olives, cucumbers, red onion) o Lentil soup with celery, carrots made with vegetable broth, garlic, salt and pepper  o Tabouli salad: parsley, bulgur, mint, scallions, cucumbers, tomato, radishes, lemon juice, olive oil, salt and pepper.

## 2016-08-12 NOTE — Progress Notes (Signed)
Impression and Recommendations:    1. Essential hypertension   2. H/o back strain - M-SK and rad to sides/ front   3. Myalgia due to statin   4. Mixed hyperlipidemia   5. Hypertriglyceridemia   6. Excessive drinking of alcohol   7. Erectile dysfunction due to diseases classified elsewhere   8. Elevated PSA measurement   9. Obesity, Class I, BMI 30-34.9   10. Elevated LFTs      No problem-specific Assessment & Plan notes found for this encounter.   The patient was counseled, risk factors were discussed, anticipatory guidance given.   New Prescriptions   No medications on file     Discontinued Medications   DOXYCYCLINE (VIBRAMYCIN) 50 MG CAPSULE    Take 50 mg by mouth 2 (two) times daily.      No orders of the defined types were placed in this encounter.    Gross side effects, risk and benefits, and alternatives of medications and treatment plan in general discussed with patient.  Patient is aware that all medications have potential side effects and we are unable to predict every side effect or drug-drug interaction that may occur.   Patient will call with any questions prior to using medication if they have concerns.  Expresses verbal understanding and consents to current therapy and treatment regimen.  No barriers to understanding were identified.  Red flag symptoms and signs discussed in detail.  Patient expressed understanding regarding what to do in case of emergency\urgent symptoms  Please see AVS handed out to patient at the end of our visit for further patient instructions/ counseling done pertaining to today's office visit.   Return in about 4 months (around 12/13/2016) for [facility] to come in for complete yearly physical as well in near future.     Note: This document was prepared using Dragon voice recognition software and may include unintentional dictation errors.  Datron Brakebill 11:10  AM --------------------------------------------------------------------------------------------------------------------------------------------------------------------------------------------------------------------------------------------    Subjective:    CC:  Chief Complaint  Patient presents with  . Hypertension    HPI: Keith Bullock is a 56 y.o. male who presents to [place] at [facility] today for issues as discussed below.  HTN;  ran out of medicine 2-3 d ago.  BP up today and   ETOH overuse:  2-4 beers per day now for about 4-6wks now.  --> Before was drinking 12 drinks per day which included occasionally vodka and/or beer  Working out 5-6 d/wk for the past 8 wks or so.   Pulled a muscle in his right buttocks region (through personal trainer) and lower back.  He'd use muscle relaxants in the past which worked well.  Requesting refill.  Feels a lot better now- wants to be around for the grandchildren.   Seeing [facility] for acne txmnt.  Accutane daily now.  Getting bldwrk monthly tr[facility] D[facility]  Was on zetia in past for chol--> couldn't tol statins due to liver enzymes and and caused terrible muscle aches.  Patient will not take it.  Wondering if we can change him to that   Wt Readings from Last 3 Encounters:  08/12/16 211 lb 12.8 oz (96.1 kg)  01/07/16 214 lb 3.2 oz (97.2 kg)  08/26/15 206 lb 8 oz (93.7 kg)   BP Readings from Last 3 Encounters:  08/12/16 (!) 149/90  01/07/16 139/88  08/26/15 122/79   Pulse Readings from Last 3 Encounters:  08/12/16 78  01/07/16  95  08/26/15 91   BMI Readings from Last 3 Encounters:  08/12/16 30.39 kg/m  01/07/16 30.73 kg/m  08/26/15 29.63 kg/m     Patient Care Team    Relationship Specialty Notifications Start End  Mellody Dance, DO PCP - General Family Medicine  01/07/16   Wilford Corner, MD Consulting Physician Gastroenterology  01/07/16   Warren Danes, PA-C Physician Assistant  Dermatology  01/07/16   Carolan Clines, MD Consulting Physician Urology  01/07/16   [name], MD Consulting Physician Infectious Diseases  01/07/16      Patient Active Problem List   Diagnosis Date Noted  . Hypertriglyceridemia 01/07/2016    Priority: High  . HLD (hyperlipidemia) 01/07/2016    Priority: High  . Essential hypertension 08/26/2015    Priority: High  . H/o back strain - M-SK and rad to sides/ front 01/07/2016    Priority: Medium  . Excessive drinking of alcohol 01/07/2016    Priority: Medium  . Obesity, Class I, BMI 30-34.9 01/07/2016    Priority: Medium  . h/o recent MRSA infection 08/26/2015    Priority: Medium  . Elevated PSA measurement 01/07/2016    Priority: Low  . Decreased libido 01/07/2016    Priority: Low  . ED (erectile dysfunction) 01/07/2016    Priority: Low  . Elevated LFTs 08/12/2016  . Myalgia due to statin 01/07/2016    Past Medical history, Surgical history, Family history, Social history, Allergies and Medications have been entered into the medical record, reviewed and changed as needed.    Current Meds  Medication Sig  . amLODipine (NORVASC) 10 MG tablet Take 1 tablet (10 mg total) by mouth daily.  . AMNESTEEM 40 MG capsule Take 1 tablet by mouth daily.  . cyclobenzaprine (FLEXERIL) 10 MG tablet Take 1 tablet (10 mg total) by mouth 3 (three) times daily as needed for muscle spasms.  Marland Kitchen FLUARIX QUADRIVALENT 0.5 ML injection   . naproxen sodium (ANAPROX) 220 MG tablet Take 220 mg by mouth as needed.  . traMADol (ULTRAM) 50 MG tablet Take 1 tablet (50 mg total) by mouth every 6 (six) hours as needed.  . valsartan-hydrochlorothiazide (DIOVAN-HCT) 320-25 MG tablet Take 1 tablet by mouth daily.  . [DISCONTINUED] amLODipine (NORVASC) 10 MG tablet TAKE 1 TABLET BY MOUTH DAILY.  . [DISCONTINUED] cyclobenzaprine (FLEXERIL) 10 MG tablet TAKE 1 TABLET BY MOUTH 3 TIMES DAILY AS NEEDED FOR MUSCLE SPASMS.  . [DISCONTINUED] doxycycline  (VIBRAMYCIN) 50 MG capsule Take 50 mg by mouth 2 (two) times daily.  . [DISCONTINUED] valsartan-hydrochlorothiazide (DIOVAN-HCT) 320-25 MG tablet TAKE 1 TABLET BY MOUTH DAILY.    Allergies:  No Known Allergies   Review of Systems: General:   Denies fever, chills, unexplained weight loss.  Optho/Auditory:   Denies visual changes, blurred vision/LOV Respiratory:   Denies wheeze, DOE more than baseline levels.  Cardiovascular:   Denies chest pain, palpitations, new onset peripheral edema  Gastrointestinal:   Denies nausea, vomiting, diarrhea, abd pain.  Genitourinary: Denies dysuria, freq/ urgency, flank pain or discharge from genitals.  Endocrine:     Denies hot or cold intolerance, polyuria, polydipsia. Musculoskeletal:   Denies unexplained myalgias, joint swelling, unexplained arthralgias, gait problems.  Skin:  Denies new onset rash, suspicious lesions Neurological:     Denies dizziness, unexplained weakness, numbness  Psychiatric/Behavioral:   Denies mood changes, suicidal or homicidal ideations, hallucinations    Objective:   Blood pressure (!) 149/90, pulse 78, height 5\' 10"  (1.778 m), weight 211 lb 12.8 oz (  96.1 kg). Body mass index is 30.39 kg/m. General:  Well Developed, well nourished, appropriate for stated age.  Neuro:  Alert and oriented,  extra-ocular muscles intact  HEENT:  Normocephalic, atraumatic, neck supple, no carotid bruits appreciated  Skin:  no gross rash, warm, pink. Cardiac:  RRR, S1 S2 Respiratory:  ECTA B/L and A/P, Not using accessory muscles, speaking in full sentences- unlabored. Vascular:  Ext warm, no cyanosis apprec.; cap RF less 2 sec. Psych:  No HI/SI, judgement and insight good, Euthymic mood. Full Affect.

## 2016-08-18 DIAGNOSIS — L700 Acne vulgaris: Secondary | ICD-10-CM | POA: Diagnosis not present

## 2016-08-18 DIAGNOSIS — L089 Local infection of the skin and subcutaneous tissue, unspecified: Secondary | ICD-10-CM | POA: Diagnosis not present

## 2016-08-18 DIAGNOSIS — L080 Pyoderma: Secondary | ICD-10-CM | POA: Diagnosis not present

## 2016-09-01 DIAGNOSIS — L709 Acne, unspecified: Secondary | ICD-10-CM | POA: Diagnosis not present

## 2016-09-01 DIAGNOSIS — Z79899 Other long term (current) drug therapy: Secondary | ICD-10-CM | POA: Diagnosis not present

## 2016-09-01 LAB — LIPID PANEL
Cholesterol: 228 — AB (ref 0–200)
HDL: 79 — AB (ref 35–70)
LDL Cholesterol: 29
Triglycerides: 147 (ref 40–160)

## 2016-09-01 LAB — BASIC METABOLIC PANEL
BUN: 21 (ref 4–21)
Creatinine: 1 (ref 0.6–1.3)
GLUCOSE: 113
POTASSIUM: 4.2 (ref 3.4–5.3)
Sodium: 133 — AB (ref 137–147)

## 2016-09-01 LAB — HEPATIC FUNCTION PANEL
ALT: 76 — AB (ref 10–40)
AST: 87 — AB (ref 14–40)
Alkaline Phosphatase: 63 (ref 25–125)
Bilirubin, Total: 0.8

## 2016-09-01 LAB — CBC AND DIFFERENTIAL
HCT: 45 (ref 41–53)
HEMOGLOBIN: 15 (ref 13.5–17.5)
Platelets: 238 (ref 150–399)
WBC: 4.7

## 2016-10-02 DIAGNOSIS — Z79899 Other long term (current) drug therapy: Secondary | ICD-10-CM | POA: Diagnosis not present

## 2016-10-02 DIAGNOSIS — L700 Acne vulgaris: Secondary | ICD-10-CM | POA: Diagnosis not present

## 2016-10-06 DIAGNOSIS — N400 Enlarged prostate without lower urinary tract symptoms: Secondary | ICD-10-CM | POA: Diagnosis not present

## 2016-10-06 DIAGNOSIS — N5201 Erectile dysfunction due to arterial insufficiency: Secondary | ICD-10-CM | POA: Diagnosis not present

## 2016-10-06 DIAGNOSIS — R972 Elevated prostate specific antigen [PSA]: Secondary | ICD-10-CM | POA: Diagnosis not present

## 2016-11-03 DIAGNOSIS — Z79899 Other long term (current) drug therapy: Secondary | ICD-10-CM | POA: Diagnosis not present

## 2016-11-03 DIAGNOSIS — Z5181 Encounter for therapeutic drug level monitoring: Secondary | ICD-10-CM | POA: Diagnosis not present

## 2016-11-03 DIAGNOSIS — L700 Acne vulgaris: Secondary | ICD-10-CM | POA: Diagnosis not present

## 2016-12-08 DIAGNOSIS — L700 Acne vulgaris: Secondary | ICD-10-CM | POA: Diagnosis not present

## 2016-12-08 DIAGNOSIS — L0293 Carbuncle, unspecified: Secondary | ICD-10-CM | POA: Diagnosis not present

## 2016-12-08 DIAGNOSIS — L089 Local infection of the skin and subcutaneous tissue, unspecified: Secondary | ICD-10-CM | POA: Diagnosis not present

## 2016-12-14 ENCOUNTER — Ambulatory Visit (INDEPENDENT_AMBULATORY_CARE_PROVIDER_SITE_OTHER): Payer: BLUE CROSS/BLUE SHIELD | Admitting: Family Medicine

## 2016-12-14 ENCOUNTER — Encounter: Payer: Self-pay | Admitting: Family Medicine

## 2016-12-14 VITALS — BP 135/88 | HR 87 | Ht 70.0 in | Wt 220.2 lb

## 2016-12-14 DIAGNOSIS — R945 Abnormal results of liver function studies: Secondary | ICD-10-CM

## 2016-12-14 DIAGNOSIS — Z87828 Personal history of other (healed) physical injury and trauma: Secondary | ICD-10-CM

## 2016-12-14 DIAGNOSIS — R7989 Other specified abnormal findings of blood chemistry: Secondary | ICD-10-CM

## 2016-12-14 DIAGNOSIS — E781 Pure hyperglyceridemia: Secondary | ICD-10-CM

## 2016-12-14 DIAGNOSIS — I10 Essential (primary) hypertension: Secondary | ICD-10-CM

## 2016-12-14 DIAGNOSIS — R1033 Periumbilical pain: Secondary | ICD-10-CM

## 2016-12-14 DIAGNOSIS — A4902 Methicillin resistant Staphylococcus aureus infection, unspecified site: Secondary | ICD-10-CM | POA: Diagnosis not present

## 2016-12-14 DIAGNOSIS — L84 Corns and callosities: Secondary | ICD-10-CM

## 2016-12-14 DIAGNOSIS — R7301 Impaired fasting glucose: Secondary | ICD-10-CM

## 2016-12-14 DIAGNOSIS — F1010 Alcohol abuse, uncomplicated: Secondary | ICD-10-CM

## 2016-12-14 DIAGNOSIS — Z8739 Personal history of other diseases of the musculoskeletal system and connective tissue: Secondary | ICD-10-CM

## 2016-12-14 DIAGNOSIS — E782 Mixed hyperlipidemia: Secondary | ICD-10-CM

## 2016-12-14 DIAGNOSIS — E669 Obesity, unspecified: Secondary | ICD-10-CM

## 2016-12-14 DIAGNOSIS — E66811 Obesity, class 1: Secondary | ICD-10-CM

## 2016-12-14 DIAGNOSIS — G8929 Other chronic pain: Secondary | ICD-10-CM

## 2016-12-14 DIAGNOSIS — K420 Umbilical hernia with obstruction, without gangrene: Secondary | ICD-10-CM | POA: Diagnosis not present

## 2016-12-14 MED ORDER — TRAMADOL HCL 50 MG PO TABS: 50.0000 mg | ORAL_TABLET | Freq: Four times a day (QID) | ORAL | 0 refills | Status: DC | PRN

## 2016-12-14 MED ORDER — EZETIMIBE 10 MG PO TABS: 10.0000 mg | ORAL_TABLET | Freq: Every day | ORAL | 1 refills | Status: DC

## 2016-12-14 MED ORDER — VALSARTAN-HYDROCHLOROTHIAZIDE 320-25 MG PO TABS: 1.0000 | ORAL_TABLET | Freq: Every day | ORAL | 1 refills | Status: DC

## 2016-12-14 MED ORDER — AMLODIPINE BESYLATE 10 MG PO TABS: 10.0000 mg | ORAL_TABLET | Freq: Every day | ORAL | 1 refills | Status: DC

## 2016-12-14 NOTE — Assessment & Plan Note (Signed)
-  Start Zetia  -Review of recent LFTs from end of August showed still elevated.  -Importance of cutting back on alcohol usage and rechecking LFTs in 6 weeks discussed with patient.  Asked patient to make a lab only visit prior to leaving office today.

## 2016-12-14 NOTE — Assessment & Plan Note (Signed)
Lifestyle changes such as dash diet and engaging in a regular exercise program discussed with patient.  Educational handouts provided  Ambulatory BP monitoring encouraged. Keep log and bring in next OV  Continue current medication(s).   Also, risks and benefits of medications discussed with patient, including alternative treatments.   Encouraged patient to read drug information handouts to further educate self about the medicine prior to starting it.   Contact us prior with any Q's/ concerns.

## 2016-12-14 NOTE — Assessment & Plan Note (Signed)
-  We will check A1c in the near future when he comes in for his complete physical in a couple weeks

## 2016-12-14 NOTE — Assessment & Plan Note (Signed)
-  Highly encouraged to cut back on alcohol intake

## 2016-12-14 NOTE — Assessment & Plan Note (Signed)
Surgical consult placed.

## 2016-12-14 NOTE — Assessment & Plan Note (Signed)

## 2016-12-14 NOTE — Assessment & Plan Note (Signed)
Referral to podiatry  

## 2016-12-14 NOTE — Assessment & Plan Note (Signed)
Refill provided to patient.  Number 30 pills. -Patient agrees not to get these pain meds from anybody else except for me. -He understands if he increase his usage he will need to obtain a pain management doctor

## 2016-12-14 NOTE — Assessment & Plan Note (Signed)
-  Cut back on alcohol intake. -Importance of this in addition to him starting his cholesterol meds discussed.

## 2016-12-14 NOTE — Patient Instructions (Addendum)
Please come in in approximately 6 weeks for a lab only visit to check your ALT and AST.    (Keep your appointment for your physical on December 11 and please just make an additional appointment for lab only visit)  this is important since we started you on Zetia and you do have a history of elevated liver enzymes.   -Please try to cut back on alcohol as well.      Guidelines for a Low Cholesterol, Low Saturated Fat Diet   Fats - Limit total intake of fats and oils. - Avoid butter, stick margarine, shortening, lard, palm and coconut oils. - Limit mayonnaise, salad dressings, gravies and sauces, unless they are homemade with low-fat ingredients. - Limit chocolate. - Choose low-fat and nonfat products, such as low-fat mayonnaise, low-fat or non-hydrogenated peanut butter, low-fat or fat-free salad dressings and nonfat gravy. - Use vegetable oil, such as canola or olive oil. - Look for margarine that does not contain trans fatty acids. - Use nuts in moderate amounts. - Read ingredient labels carefully to determine both amount and type of fat present in foods. Limit saturated and trans fats! - Avoid high-fat processed and convenience foods.  Meats and Meat Alternatives - Choose fish, chicken, [place] and lean meats. - Use dried beans, peas, lentils and tofu. - Limit egg yolks to three to four per week. - If you eat red meat, limit to no more than three servings per week and choose loin or round cuts. - Avoid fatty meats, such as bacon, sausage, franks, luncheon meats and ribs. - Avoid all organ meats, including liver.  Dairy - Choose nonfat or low-fat milk, yogurt and cottage cheese. - Most cheeses are high in fat. Choose cheeses made from non-fat milk, such as mozzarella and ricotta cheese. - Choose light or fat-free cream cheese and sour cream. - Avoid cream and sauces made with cream.  Fruits and Vegetables - Eat a wide variety of fruits and vegetables. - Use lemon juice, vinegar  or "mist" olive oil on vegetables. - Avoid adding sauces, fat or oil to vegetables.  Breads, Cereals and Grains - Choose whole-grain breads, cereals, pastas and rice. - Avoid high-fat snack foods, such as granola, cookies, pies, pastries, doughnuts and croissants.  Cooking Tips - Avoid deep fried foods. - Trim visible fat off meats and remove skin from poultry before cooking. - Bake, broil, boil, poach or roast poultry, fish and lean meats. - Drain and discard fat that drains out of meat as you cook it. - Add little or no fat to foods. - Use vegetable oil sprays to grease pans for cooking or baking. - Steam vegetables. - Use herbs or no-oil marinades to flavor foods.

## 2016-12-14 NOTE — Assessment & Plan Note (Addendum)
-  He understands how this affects his liver enzymes as well and will attempt to cut back.

## 2016-12-14 NOTE — Progress Notes (Signed)
Impression and Recommendations:    1. Irreducible umbilical hernia   2. Chronic periumbilical pain with hernia   3. Elevated LFTs   4. Obesity, Class I, BMI 30-34.9   5. Mixed hyperlipidemia   6. Hypertriglyceridemia   7. Essential hypertension   8. h/o recent MRSA infection   9. Excessive drinking of alcohol   10. H/o back strain - M-SK and rad to sides/ front   11. Callus of foot   12. Elevated fasting blood sugar- 08/2016 w derm labs     Excessive drinking of alcohol -He understands how this affects his liver enzymes as well and will attempt to cut back.  H/o back strain - M-SK and rad to sides/ front Refill provided to patient.  Number 30 pills. -Patient agrees not to get these pain meds from anybody else except for me. -He understands if he increase his usage he will need to obtain a pain management doctor  Callus of foot Referral to podiatry  HLD (hyperlipidemia) -Start Zetia  -Review of recent LFTs from end of August showed still elevated.  -Importance of cutting back on alcohol usage and rechecking LFTs in 6 weeks discussed with patient.  Asked patient to make a lab only visit prior to leaving office today.    Hypertriglyceridemia -Cut back on alcohol intake. -Importance of this in addition to him starting his cholesterol meds discussed.  Essential hypertension Lifestyle changes such as dash diet and engaging in a regular exercise program discussed with patient.  Educational handouts provided  Ambulatory BP monitoring encouraged. Keep log and bring in next OV  Continue current medication(s).   Also, risks and benefits of medications discussed with patient, including alternative treatments.   Encouraged patient to read drug information handouts to further educate self about the medicine prior to starting it.   Contact us prior with any Q's/ concerns.  Obesity, Class I, BMI 30-34.9 Explained to patient what BMI refers to, and what it means medically.     Told patient to think about it as a "medical risk stratification measurement" and how increasing BMI is associated with increasing risk/ or worsening state of various diseases such as hypertension, hyperlipidemia, diabetes, premature OA, depression etc.  American Heart Association guidelines for healthy diet, basically Mediterranean diet, and exercise guidelines of 30 minutes 5 days per week or more discussed in detail.  Health counseling performed.  All questions answered.  Elevated fasting blood sugar- 08/2016 w derm labs -We will check A1c in the near future when he comes in for his complete physical in a couple weeks  Elevated LFTs -Highly encouraged to cut back on alcohol intake  Chronic periumbilical pain with hernia Surgical consult placed.    Education and routine counseling performed. Handouts provided.  Orders Placed This Encounter  Procedures  . Ambulatory referral to General Surgery  . Ambulatory referral to Podiatry     Return in about 4 months (around 04/13/2017).  The patient was counseled, risk factors were discussed, anticipatory guidance given.  Gross side effects, risk and benefits, and alternatives of medications discussed with patient.  Patient is aware that all medications have potential side effects and we are unable to predict every side effect or drug-drug interaction that may occur.  Expresses verbal understanding and consents to current therapy plan and treatment regimen.  Please see AVS handed out to patient at the end of our visit for further patient instructions/ counseling done pertaining to today's office visit.    Note: This  document was prepared using Systems analyst and may include unintentional dictation errors.     Subjective:    Chief Complaint  Patient presents with  . Follow-up    HPI: Keith Bullock is a 56 y.o. male who presents to [place] [facility] today for follow up for HTN.      Problem  Hypertriglyceridemia  Hld (Hyperlipidemia)   Recently had labs at his dermatologist to include LFTs and lipids in August 2018.  Extracted labs-see lab tab. Total cholesterol 228 triglyceride 147 HDL 49 LDL 150.  Patient's 10-year risk is over 9% but he is not a candidate for lipids treatment   -  Was on lipitor in past---> effected liver enzymes and jt pains.   -   however he was tried on Zetia in the past which worked well and did not affect his liver enzymes or joint and muscle pains.  His 10-year calculated risk with the labs in August 2018 shows a 9.2% 10-year atherosclerotic cardiovascular risk    Essential Hypertension  H/o back strain - M-SK and rad to sides/ front   Occ back pain after workingout too hard in gym or working out in yrad to much.   Last prescription was given from me to him for 30 tablets on 01/07/2016.  He only occasionally has to take them when his pain really acts up in his back. -His bilateral lower back and into his butt cheeks a little and upper thighs.  Tramadol works very well to stop the pain cycle and take him out of it.  -Patient is not getting any other pain medicines from anybody else except for me.    Excessive Drinking of Alcohol   Patient still drinking about 5 drinks per day or so may be a little more on the weekends.    Per pt- in his pours- probably 10-12 shots of etoh /day.    Obesity, Class I, Bmi 30-34.9   Still exercising-  Intense cardio- 80 min/wk with trainer but active lifestyle.    Chronic periumbilical pain with hernia   Patient states hernia present for over about a year.  Used to be able to push it back in and now he cannot.  Occasionally has some pain in the area-sharp and stabbing at times.  But it is fleeing and goes away precipitously.  No GI sx. Would like Gen Sx c/s placed to discuss possible repair.    Irreducible Umbilical Hernia  Callus of Foot   Patient has an enlargement of the skin just proximal to the  left first digit of his toe.  He has noticed this overgrowth for approximately 2 months now.  When he first noticed it was 2 months ago has not noticed it getting bigger.  There is no pain.  He just would like to know how to get rid of it.   Elevated fasting blood sugar- 08/2016 w derm labs  Elevated Lfts   Abd [place]- 2012- has shown fatty liver infilitrate. LFT's been elevated many, many yrs now.       HTN:  -  His blood pressure has been controlled at home.  130's/80's- checks q 3 days or so.   - Patient reports good compliance with blood pressure medications  - Denies medication S-E   - Smoking Status noted   - He denies new onset of: chest pain, exercise intolerance, shortness of breath, dizziness, visual changes, headache, lower extremity swelling or claudication.   Today  their BP is BP: 135/88   Last 3 blood pressure readings in our office are as follows: BP Readings from Last 3 Encounters:  12/14/16 135/88  08/12/16 (!) 139/91  01/07/16 139/88    Pulse Readings from Last 3 Encounters:  12/14/16 87  08/12/16 73  01/07/16 95    Filed Weights   12/14/16 0810  Weight: 220 lb 3.2 oz (99.9 kg)      Patient Care Team    Relationship Specialty Notifications Start End  Mellody Dance, DO PCP - General Family Medicine  01/07/16   Wilford Corner, MD Consulting Physician Gastroenterology  01/07/16   Warren Danes, PA-C Physician Assistant Dermatology  01/07/16   Carolan Clines, MD Consulting Physician Urology  01/07/16   [name], MD Consulting Physician Infectious Diseases  01/07/16      Lab Results  Component Value Date   CREATININE 1.0 09/01/2016   BUN 21 09/01/2016   NA 133 (A) 09/01/2016   K 4.2 09/01/2016   CL 95 (L) 02/20/2016   CO2 27 02/20/2016    Lab Results  Component Value Date   CHOL 228 (A) 09/01/2016   CHOL 199 03/26/2016   CHOL 207 (H) 01/07/2016    Lab Results  Component Value Date   HDL 79 (A) 09/01/2016   HDL  44 03/26/2016   HDL 53 01/07/2016    Lab Results  Component Value Date   LDLCALC 29 09/01/2016   LDLCALC 127 (H) 03/26/2016   LDLCALC 118 04/30/2014    Lab Results  Component Value Date   TRIG 147 09/01/2016   TRIG 142 03/26/2016   TRIG 191 (H) 01/07/2016    Lab Results  Component Value Date   CHOLHDL 4.5 03/26/2016   CHOLHDL 3.9 01/07/2016    No results found for: LDLDIRECT ===================================================================   Patient Active Problem List   Diagnosis Date Noted  . Hypertriglyceridemia 01/07/2016    Priority: High  . HLD (hyperlipidemia) 01/07/2016    Priority: High  . Essential hypertension 08/26/2015    Priority: High  . H/o back strain - M-SK and rad to sides/ front 01/07/2016    Priority: Medium  . Excessive drinking of alcohol 01/07/2016    Priority: Medium  . Obesity, Class I, BMI 30-34.9 01/07/2016    Priority: Medium  . h/o recent MRSA infection 08/26/2015    Priority: Medium  . Elevated PSA measurement 01/07/2016    Priority: Low  . Decreased libido 01/07/2016    Priority: Low  . ED (erectile dysfunction) 01/07/2016    Priority: Low  . Chronic periumbilical pain with hernia 12/14/2016  . Irreducible umbilical hernia 67/67/2094  . Callus of foot 12/14/2016  . Elevated fasting blood sugar- 08/2016 w derm labs 12/14/2016  . Elevated LFTs 08/12/2016  . Myalgia due to statin 01/07/2016     Past Medical History:  Diagnosis Date  . Hypertension   . MRSA cellulitis      Past Surgical History:  Procedure Laterality Date  . ROTATOR CUFF REPAIR Left      Family History  Problem Relation Age of Onset  . Cancer Mother        Ovarian  . Depression Mother   . Hypertension Mother   . Cancer Father        Colon  . Diabetes Paternal Grandfather      Social History   Substance and Sexual Activity  Drug Use No  ,  Social History   Substance and Sexual Activity  Alcohol  Use Yes   Comment: 2-3 a day  ,   Social History   Tobacco Use  Smoking Status Never Smoker  Smokeless Tobacco Never Used  ,    Current Outpatient Medications on File Prior to Visit  Medication Sig Dispense Refill  . AMNESTEEM 40 MG capsule Take 1 tablet by mouth daily.  0  . cyclobenzaprine (FLEXERIL) 10 MG tablet Take 1 tablet (10 mg total) by mouth 3 (three) times daily as needed for muscle spasms. 90 tablet 0  . naproxen sodium (ANAPROX) 220 MG tablet Take 220 mg by mouth as needed.     No current facility-administered medications on file prior to visit.      No Known Allergies   Review of Systems:   General:  Denies fever, chills Optho/Auditory:   Denies visual changes, blurred vision Respiratory:   Denies SOB, cough, wheeze, DIB  Cardiovascular:   Denies chest pain, palpitations, painful respirations Gastrointestinal:   Denies nausea, vomiting, diarrhea.  Endocrine:     Denies new hot or cold intolerance Musculoskeletal:  Denies joint swelling, gait issues, or new unexplained myalgias/ arthralgias Skin:  Denies rash, suspicious lesions  Neurological:    Denies dizziness, unexplained weakness, numbness  Psychiatric/Behavioral:   Denies mood changes    Objective:    Blood pressure 135/88, pulse 87, height 5\' 10"  (1.778 m), weight 220 lb 3.2 oz (99.9 kg).  Body mass index is 31.6 kg/m.  General: Well Developed, well nourished, and in no acute distress.  HEENT: Normocephalic, atraumatic, pupils equal round reactive to light, neck supple, No carotid bruits, no JVD Skin: Warm and dry, cap RF less 2 sec Cardiac: Regular rate and rhythm, S1, S2 WNL's, no murmurs rubs or gallops Respiratory: ECTA B/L, Not using accessory muscles, speaking in full sentences. NeuroM-Sk: Ambulates w/o assistance, moves ext * 4 w/o difficulty, sensation grossly intact.  Ext: scant edema b/l lower ext Psych: No HI/SI, judgement and insight good, Euthymic mood. Full Affect.

## 2016-12-24 ENCOUNTER — Ambulatory Visit (INDEPENDENT_AMBULATORY_CARE_PROVIDER_SITE_OTHER): Payer: BLUE CROSS/BLUE SHIELD | Admitting: Podiatry

## 2016-12-24 ENCOUNTER — Ambulatory Visit (INDEPENDENT_AMBULATORY_CARE_PROVIDER_SITE_OTHER): Payer: BLUE CROSS/BLUE SHIELD

## 2016-12-24 ENCOUNTER — Encounter: Payer: Self-pay | Admitting: Podiatry

## 2016-12-24 VITALS — BP 143/96 | HR 75 | Ht 70.0 in | Wt 220.0 lb

## 2016-12-24 DIAGNOSIS — D2122 Benign neoplasm of connective and other soft tissue of left lower limb, including hip: Secondary | ICD-10-CM

## 2016-12-24 DIAGNOSIS — M79672 Pain in left foot: Secondary | ICD-10-CM

## 2016-12-24 DIAGNOSIS — M779 Enthesopathy, unspecified: Secondary | ICD-10-CM | POA: Diagnosis not present

## 2016-12-24 DIAGNOSIS — R2242 Localized swelling, mass and lump, left lower limb: Secondary | ICD-10-CM

## 2016-12-24 NOTE — Progress Notes (Signed)
  Subjective:  Patient ID: Keith Bullock, male    DOB: Mar 06, 1960,  MRN: [id number]  Chief Complaint  Patient presents with  . Callouses    Left foot, bottom of foot under great toe. Swelling, pocket of fluid "looks like anotther toe growing"   56 y.o. male presents with the above complaint.  Reports mass in the ball of the left foot.  Endorses swelling states that he looks like there is another toe growing underneath his toe. Past Medical History:  Diagnosis Date  . Hypertension   . MRSA cellulitis    Past Surgical History:  Procedure Laterality Date  . ROTATOR CUFF REPAIR Left     Current Outpatient Medications:  .  amLODipine (NORVASC) 10 MG tablet, Take 1 tablet (10 mg total) by mouth daily., Disp: 90 tablet, Rfl: 1 .  AMNESTEEM 40 MG capsule, Take 1 tablet by mouth daily., Disp: , Rfl: 0 .  cyclobenzaprine (FLEXERIL) 10 MG tablet, Take 1 tablet (10 mg total) by mouth 3 (three) times daily as needed for muscle spasms. (Patient not taking: Reported on 12/24/2016), Disp: 90 tablet, Rfl: 0 .  ezetimibe (ZETIA) 10 MG tablet, Take 1 tablet (10 mg total) by mouth at bedtime., Disp: 90 tablet, Rfl: 1 .  naproxen sodium (ANAPROX) 220 MG tablet, Take 220 mg by mouth as needed., Disp: , Rfl:  .  traMADol (ULTRAM) 50 MG tablet, Take 1 tablet (50 mg total) by mouth every 6 (six) hours as needed. (Patient not taking: Reported on 12/24/2016), Disp: 30 tablet, Rfl: 0 .  valsartan-hydrochlorothiazide (DIOVAN-HCT) 320-25 MG tablet, Take 1 tablet by mouth daily., Disp: 90 tablet, Rfl: 1  No Known Allergies Review of Systems Objective:   Vitals:   12/24/16 0906  BP: (!) 143/96  Pulse: 75   General AA&O x3. Normal mood and affect.  Vascular Dorsalis pedis and posterior tibial pulses  present 2+ bilaterally  Capillary refill normal to all digits. Pedal hair growth normal.  Neurologic Epicritic sensation grossly present.  Dermatologic No open lesions. Interspaces clear of maceration. Nails  well groomed and normal in appearance.  Firm mass to the plantar aspect of the first MPJ immobile  Orthopedic: MMT 5/5 in dorsiflexion, plantarflexion, inversion, and eversion. Normal joint ROM without pain or crepitus.    Assessment & Plan:  Patient was evaluated and treated and all questions answered.  Lipoma/fibroma first MPJ -Radiographs taken and reviewed without any underlying osseous abnormality -MRI ordered for further eval -Pending MRI patient may benefit from removal  Follow-up after MRI  No Follow-up on file.

## 2016-12-24 NOTE — Progress Notes (Signed)
   Subjective:    Patient ID: Keith Bullock, male    DOB: 29-Feb-1960, 56 y.o.   MRN: [id number]  HPI  Chief Complaint  Patient presents with  . Callouses    Left foot, bottom of foot under great toe. Swelling, pocket of fluid "looks like anotther toe growing"       Review of Systems  All other systems reviewed and are negative.      Objective:   Physical Exam        Assessment & Plan:

## 2016-12-29 ENCOUNTER — Encounter: Payer: BLUE CROSS/BLUE SHIELD | Admitting: Family Medicine

## 2017-01-05 ENCOUNTER — Telehealth: Payer: Self-pay | Admitting: Podiatry

## 2017-01-05 NOTE — Telephone Encounter (Signed)
I'm calling for my husband. He was seen about 10 days ago and they were supposed to set up an MRI on his foot. We have not heard anything and we wanted to call and make sure it didn't fall through the cracks with the snow and bad weather. If you could give [place] a call back. Our phone number is [phone]. Thank you.

## 2017-01-07 ENCOUNTER — Telehealth: Payer: Self-pay | Admitting: Podiatry

## 2017-01-07 ENCOUNTER — Ambulatory Visit: Payer: Self-pay | Admitting: General Surgery

## 2017-01-07 DIAGNOSIS — K429 Umbilical hernia without obstruction or gangrene: Secondary | ICD-10-CM | POA: Diagnosis not present

## 2017-01-07 DIAGNOSIS — M720 Palmar fascial fibromatosis [Dupuytren]: Secondary | ICD-10-CM

## 2017-01-07 NOTE — Telephone Encounter (Signed)
Dr. March Rummage ordered MRI left foot without contrast for plantar fibroma. Ordered MRI and informed pt, faxed to [place] then given to Gretta Arab, RN.

## 2017-01-07 NOTE — Telephone Encounter (Signed)
Entered by error

## 2017-01-07 NOTE — Addendum Note (Signed)
Addended by: Harriett Sine D on: 01/07/2017 03:59 PM   Modules accepted: Orders

## 2017-01-07 NOTE — Telephone Encounter (Signed)
Pts wife called in on the 18th of December and was following up today to see when Pt needed to be scheduled for his MRI. If you can call them back at [phone].   Thank You! Domenick Gong

## 2017-01-07 NOTE — H&P (Signed)
History of Present Illness Keith Ok MD; 01/07/2017 10:11 AM) The patient is a 56 year old male who presents with an umbilical hernia. Referred by: Dr. Dannial Monarch Chief Complaint: Umbilical hernia  Patient is a 56 year old male, with a history of hypercholesterolemia, hypertension, with a 5-6 month history of an umbilical hernia. He states got bigger. He does state his pain in that area. He does do some weightlifting recreationally. He states this appears had made it larger. He's had no signs or symptoms of incarceration or strangulation. He's had no previous abdominal surgeries. There are no other modifying factors.    Past Surgical History [name], [name]; 27/25/3664 40:34 AM) Colon Polyp Removal - Colonoscopy  Shoulder Surgery  Left.  Diagnostic Studies History [name], [name]; 74/25/9563 87:56 AM) Colonoscopy  1-5 years ago  Allergies [name], [name]; 43/32/9518 84:16 AM) No Known Drug Allergies [01/07/2017]: Allergies Reconciled   Medication History [name], [name]; 60/63/0160 10:93 AM) TraMADol HCl (50MG  Tablet, Oral) Active. AmLODIPine Besylate (10MG  Tablet, Oral) Active. Amnesteem (40MG  Capsule, Oral) Active. Cyclobenzaprine HCl (10MG  Tablet, Oral) Active. Ezetimibe (10MG  Tablet, Oral) Active. Valsartan-Hydrochlorothiazide (320-25MG  Tablet, Oral) Active. Cialis (2.5MG  Tablet, Oral as needed) Active. Multivitamins (Oral) Active. Medications Reconciled  Social History [name], [name]; 23/55/7322 02:54 AM) Alcohol use  Heavy alcohol use. Caffeine use  Carbonated beverages, Coffee, Tea. No drug use  Tobacco use  Never smoker.  Family History [name], [name]; 27/06/2374 28:31 AM) Colon Cancer  Father. Hypertension  Mother. Ovarian Cancer  Mother.  Other Problems [name], [name]; 51/76/1607 37:10 AM) Back Pain  Enlarged Prostate  High blood pressure  Hypercholesterolemia  Umbilical Hernia Repair      Review of Systems Keith Ok MD; 01/07/2017 10:08 AM) General Not Present- Appetite Loss, Chills, Fatigue, Fever, Night Sweats, Weight Gain and Weight Loss. Skin Not Present- Change in Wart/Mole, Dryness, Hives, Jaundice, New Lesions, Non-Healing Wounds, Rash and Ulcer. HEENT Not Present- Earache, Hearing Loss, Hoarseness, Nose Bleed, Oral Ulcers, Ringing in the Ears, Seasonal Allergies, Sinus Pain, Sore Throat, Visual Disturbances, Wears glasses/contact lenses and Yellow Eyes. Respiratory Not Present- Bloody sputum, Chronic Cough, Difficulty Breathing, Snoring and Wheezing. Breast Not Present- Breast Mass, Breast Pain, Nipple Discharge and Skin Changes. Cardiovascular Not Present- Chest Pain, Difficulty Breathing Lying Down, Leg Cramps, Palpitations, Rapid Heart Rate, Shortness of Breath and Swelling of Extremities. Gastrointestinal Present- Abdominal Pain. Not Present- Bloating, Bloody Stool, Change in Bowel Habits, Chronic diarrhea, Constipation, Difficulty Swallowing, Excessive gas, Gets full quickly at meals, Hemorrhoids, Indigestion, Nausea, Rectal Pain and Vomiting. Male Genitourinary Present- Frequency. Not Present- Blood in Urine, Change in Urinary Stream, Impotence, Nocturia, Painful Urination, Urgency and Urine Leakage. Musculoskeletal Not Present- Back Pain, Joint Pain, Joint Stiffness, Muscle Pain, Muscle Weakness and Swelling of Extremities. Neurological Not Present- Decreased Memory, Fainting, Headaches, Numbness, Seizures, Tingling, Tremor, Trouble walking and Weakness. Psychiatric Not Present- Anxiety, Bipolar, Change in Sleep Pattern, Depression, Fearful and Frequent crying. Endocrine Not Present- Cold Intolerance, Excessive Hunger, Hair Changes, Heat Intolerance, Hot flashes and New Diabetes. Hematology Not Present- Blood Thinners, Easy Bruising, Excessive bleeding, Gland problems, HIV and Persistent Infections. All other systems negative  Vitals [name] [facility] [name];  62/69/4854 62:70 AM) 01/07/2017 10:03 AM Weight: 219.5 lb Height: 70in Body Surface Area: 2.17 m Body Mass Index: 31.49 kg/m  Temp.: 98.24F(Temporal)  Pulse: 84 (Regular)  BP: 128/84 (Sitting, Left Arm, Standard)       Physical Exam Keith Ok MD; 01/07/2017 10:10 AM) The physical exam findings are as follows:  Note:Constitutional: No acute distress, conversant, appears stated age  Eyes: Anicteric sclerae, moist conjunctiva, no lid lag  Neck: No thyromegaly, trachea midline, no cervical lymphadenopathy  Lungs: Clear to auscultation biilaterally, normal respiratory effot  Cardiovascular: regular rate & rhythm, no murmurs, no peripheal edema, pedal pulses 2+  GI: Soft, no masses or hepatosplenomegaly, non-tender to palpation  MSK: Normal gait, no clubbing cyanosis, edema  Skin: No rashes, palpation reveals normal skin turgor  Psychiatric: Appropriate judgment and insight, oriented to person, place, and time  Abdomen Inspection Hernias - Umbilical hernia - Incarcerated(Chronically incarcerated umbilical hernia).    Assessment & Plan Keith Ok MD; 62/94/7654 65:03 AM) UMBILICAL HERNIA WITHOUT OBSTRUCTION AND WITHOUT GANGRENE (K42.9) Impression: 56 year old male with a chronically incarcerated umbilical hernia, history of hypertension, hypercholesterolemia. 1. The patient will like to proceed to the operating room for laparoscopic umbilical hernia repair with mesh.  2. I discussed with the patient the signs and symptoms of incarceration and strangulation and the need to proceed to the ER should they occur.  3. I discussed with the patient the risks and benefits of the procedure to include but not limited to: Infection, bleeding, damage to surrounding structures, possible need for further surgery, possible nerve pain, and possible recurrence. The patient was understanding and wishes to proceed.

## 2017-01-08 DIAGNOSIS — Z5181 Encounter for therapeutic drug level monitoring: Secondary | ICD-10-CM | POA: Diagnosis not present

## 2017-01-08 DIAGNOSIS — L739 Follicular disorder, unspecified: Secondary | ICD-10-CM | POA: Diagnosis not present

## 2017-01-08 DIAGNOSIS — L709 Acne, unspecified: Secondary | ICD-10-CM | POA: Diagnosis not present

## 2017-01-22 ENCOUNTER — Ambulatory Visit
Admission: RE | Admit: 2017-01-22 | Discharge: 2017-01-22 | Disposition: A | Discharge status: Home / Self Care | Payer: BLUE CROSS/BLUE SHIELD | Source: Ambulatory Visit | Attending: Podiatry | Admitting: Podiatry

## 2017-01-22 DIAGNOSIS — M7989 Other specified soft tissue disorders: Secondary | ICD-10-CM | POA: Diagnosis not present

## 2017-01-25 ENCOUNTER — Other Ambulatory Visit: Payer: BLUE CROSS/BLUE SHIELD

## 2017-01-25 DIAGNOSIS — R7989 Other specified abnormal findings of blood chemistry: Secondary | ICD-10-CM

## 2017-01-25 DIAGNOSIS — E781 Pure hyperglyceridemia: Secondary | ICD-10-CM | POA: Diagnosis not present

## 2017-01-25 DIAGNOSIS — R7301 Impaired fasting glucose: Secondary | ICD-10-CM | POA: Diagnosis not present

## 2017-01-25 DIAGNOSIS — R945 Abnormal results of liver function studies: Secondary | ICD-10-CM

## 2017-01-25 DIAGNOSIS — Z0000 Encounter for general adult medical examination without abnormal findings: Secondary | ICD-10-CM

## 2017-01-25 DIAGNOSIS — I10 Essential (primary) hypertension: Secondary | ICD-10-CM | POA: Diagnosis not present

## 2017-01-25 DIAGNOSIS — E669 Obesity, unspecified: Secondary | ICD-10-CM

## 2017-01-25 DIAGNOSIS — E782 Mixed hyperlipidemia: Secondary | ICD-10-CM | POA: Diagnosis not present

## 2017-01-26 ENCOUNTER — Encounter: Payer: Self-pay | Admitting: Podiatry

## 2017-01-26 ENCOUNTER — Ambulatory Visit (INDEPENDENT_AMBULATORY_CARE_PROVIDER_SITE_OTHER): Payer: BLUE CROSS/BLUE SHIELD | Admitting: Family Medicine

## 2017-01-26 ENCOUNTER — Encounter: Payer: Self-pay | Admitting: Family Medicine

## 2017-01-26 VITALS — BP 109/74 | HR 88 | Ht 70.0 in | Wt 219.9 lb

## 2017-01-26 DIAGNOSIS — Z0000 Encounter for general adult medical examination without abnormal findings: Secondary | ICD-10-CM | POA: Diagnosis not present

## 2017-01-26 DIAGNOSIS — R972 Elevated prostate specific antigen [PSA]: Secondary | ICD-10-CM | POA: Diagnosis not present

## 2017-01-26 DIAGNOSIS — Z7189 Other specified counseling: Secondary | ICD-10-CM | POA: Diagnosis not present

## 2017-01-26 DIAGNOSIS — Z7185 Encounter for immunization safety counseling: Secondary | ICD-10-CM

## 2017-01-26 LAB — COMPREHENSIVE METABOLIC PANEL
A/G RATIO: 1.7 (ref 1.2–2.2)
ALK PHOS: 73 IU/L (ref 39–117)
ALT: 71 IU/L — AB (ref 0–44)
AST: 55 IU/L — AB (ref 0–40)
Albumin: 4.3 g/dL (ref 3.5–5.5)
BILIRUBIN TOTAL: 0.5 mg/dL (ref 0.0–1.2)
BUN/Creatinine Ratio: 13 (ref 9–20)
BUN: 16 mg/dL (ref 6–24)
CHLORIDE: 99 mmol/L (ref 96–106)
CO2: 23 mmol/L (ref 20–29)
Calcium: 9.4 mg/dL (ref 8.7–10.2)
Creatinine, Ser: 1.19 mg/dL (ref 0.76–1.27)
GFR calc Af Amer: 78 mL/min/{1.73_m2} (ref >59)
GFR, EST NON AFRICAN AMERICAN: 68 mL/min/{1.73_m2} (ref >59)
GLOBULIN, TOTAL: 2.6 g/dL (ref 1.5–4.5)
Glucose: 82 mg/dL (ref 65–99)
POTASSIUM: 4.3 mmol/L (ref 3.5–5.2)
SODIUM: 137 mmol/L (ref 134–144)
Total Protein: 6.9 g/dL (ref 6.0–8.5)

## 2017-01-26 LAB — VITAMIN D 25 HYDROXY (VIT D DEFICIENCY, FRACTURES): VIT D 25 HYDROXY: 25.4 ng/mL — AB (ref 30.0–100.0)

## 2017-01-26 LAB — CBC WITH DIFFERENTIAL/PLATELET
BASOS ABS: 0 10*3/uL (ref 0.0–0.2)
Basos: 1 %
EOS (ABSOLUTE): 0.1 10*3/uL (ref 0.0–0.4)
EOS: 2 %
HEMOGLOBIN: 14.8 g/dL (ref 13.0–17.7)
Hematocrit: 42.8 % (ref 37.5–51.0)
IMMATURE GRANS (ABS): 0 10*3/uL (ref 0.0–0.1)
Immature Granulocytes: 0 %
LYMPHS: 41 %
Lymphocytes Absolute: 1.5 10*3/uL (ref 0.7–3.1)
MCH: 31.8 pg (ref 26.6–33.0)
MCHC: 34.6 g/dL (ref 31.5–35.7)
MCV: 92 fL (ref 79–97)
MONOCYTES: 15 %
Monocytes Absolute: 0.6 10*3/uL (ref 0.1–0.9)
NEUTROS ABS: 1.5 10*3/uL (ref 1.4–7.0)
Neutrophils: 41 %
Platelets: 246 10*3/uL (ref 150–379)
RBC: 4.65 x10E6/uL (ref 4.14–5.80)
RDW: 13.4 % (ref 12.3–15.4)
WBC: 3.7 10*3/uL (ref 3.4–10.8)

## 2017-01-26 LAB — LIPID PANEL
Chol/HDL Ratio: 4.2 ratio (ref 0.0–5.0)
Cholesterol, Total: 177 mg/dL (ref 100–199)
HDL: 42 mg/dL (ref >39)
LDL Calculated: 99 mg/dL (ref 0–99)
Triglycerides: 178 mg/dL — ABNORMAL HIGH (ref 0–149)
VLDL CHOLESTEROL CAL: 36 mg/dL (ref 5–40)

## 2017-01-26 LAB — TSH: TSH: 4.19 u[IU]/mL (ref 0.450–4.500)

## 2017-01-26 LAB — HEMOGLOBIN A1C
ESTIMATED AVERAGE GLUCOSE: 123 mg/dL
HEMOGLOBIN A1C: 5.9 % — AB (ref 4.8–5.6)

## 2017-01-26 NOTE — Patient Instructions (Addendum)
Please check with your insurance to see if they cover the Shingrix (shingles) vaccine.    - please give pt stool cards for home hemmocult study.  +make f/up in near future as desired to further discuss recent labs and PRe-Diabetes status, vit D and chol findings.    Preventive Care for Adults, Male A healthy lifestyle and preventive care can promote health and wellness. Preventive health guidelines for men include the following key practices:  A routine yearly physical is a good way to check with your health care provider about your health and preventative screening. It is a chance to share any concerns and updates on your health and to receive a thorough exam.  Visit your dentist for a routine exam and preventative care every 6 months. Brush your teeth twice a day and floss once a day. Good oral hygiene prevents tooth decay and gum disease.  The frequency of eye exams is based on your age, health, family medical history, use of contact lenses, and other factors. Follow your health care provider's recommendations for frequency of eye exams.  Eat a healthy diet. Foods such as vegetables, fruits, whole grains, low-fat dairy products, and lean protein foods contain the nutrients you need without too many calories. Decrease your intake of foods high in solid fats, added sugars, and salt. Eat the right amount of calories for you.Get information about a proper diet from your health care provider, if necessary.  Regular physical exercise is one of the most important things you can do for your health. Most adults should get at least 150 minutes of moderate-intensity exercise (any activity that increases your heart rate and causes you to sweat) each week. In addition, most adults need muscle-strengthening exercises on 2 or more days a week.  Maintain a healthy weight. The body mass index (BMI) is a screening tool to identify possible weight problems. It provides an estimate of body fat based on height  and weight. Your health care provider can find your BMI and can help you achieve or maintain a healthy weight.For adults 20 years and older:  A BMI below 18.5 is considered underweight.  A BMI of 18.5 to 24.9 is normal.  A BMI of 25 to 29.9 is considered overweight.  A BMI of 30 and above is considered obese.  Maintain normal blood lipids and cholesterol levels by exercising and minimizing your intake of saturated fat. Eat a balanced diet with plenty of fruit and vegetables. Blood tests for lipids and cholesterol should begin at age 58 and be repeated every 5 years. If your lipid or cholesterol levels are high, you are over 50, or you are at high risk for heart disease, you may need your cholesterol levels checked more frequently.Ongoing high lipid and cholesterol levels should be treated with medicines if diet and exercise are not working.  If you smoke, find out from your health care provider how to quit. If you do not use tobacco, do not start.  Lung cancer screening is recommended for adults aged 62-80 years who are at high risk for developing lung cancer because of a history of smoking. A yearly low-dose CT scan of the lungs is recommended for people who have at least a 30-pack-year history of smoking and are a current smoker or have quit within the past 15 years. A pack year of smoking is smoking an average of 1 pack of cigarettes a day for 1 year (for example: 1 pack a day for 30 years or 2 packs a  day for 15 years). Yearly screening should continue until the smoker has stopped smoking for at least 15 years. Yearly screening should be stopped for people who develop a health problem that would prevent them from having lung cancer treatment.  If you choose to drink alcohol, do not have more than 2 drinks per day. One drink is considered to be 12 ounces (355 mL) of beer, 5 ounces (148 mL) of wine, or 1.5 ounces (44 mL) of liquor.  Avoid use of street drugs. Do not share needles with anyone.  Ask for help if you need support or instructions about stopping the use of drugs.  High blood pressure causes heart disease and increases the risk of stroke. Your blood pressure should be checked at least every 1-2 years. Ongoing high blood pressure should be treated with medicines, if weight loss and exercise are not effective.  If you are 61-75 years old, ask your health care provider if you should take aspirin to prevent heart disease.  Diabetes screening is done by taking a blood sample to check your blood glucose level after you have not eaten for a certain period of time (fasting). If you are not overweight and you do not have risk factors for diabetes, you should be screened once every 3 years starting at age 1. If you are overweight or obese and you are 76-104 years of age, you should be screened for diabetes every year as part of your cardiovascular risk assessment.  Colorectal cancer can be detected and often prevented. Most routine colorectal cancer screening begins at the age of 70 and continues through age 52. However, your health care provider may recommend screening at an earlier age if you have risk factors for colon cancer. On a yearly basis, your health care provider may provide home test kits to check for hidden blood in the stool. Use of a small camera at the end of a tube to directly examine the colon (sigmoidoscopy or colonoscopy) can detect the earliest forms of colorectal cancer. Talk to your health care provider about this at age 50, when routine screening begins. Direct exam of the colon should be repeated every 5-10 years through age 37, unless early forms of precancerous polyps or small growths are found.  People who are at an increased risk for hepatitis B should be screened for this virus. You are considered at high risk for hepatitis B if:  You were born in a country where hepatitis B occurs often. Talk with your health care provider about which countries are considered  high risk.  Your parents were born in a high-risk country and you have not received a shot to protect against hepatitis B (hepatitis B vaccine).  You have HIV or AIDS.  You use needles to inject street drugs.  You live with, or have sex with, someone who has hepatitis B.  You are a man who has sex with other men (MSM).  You get hemodialysis treatment.  You take certain medicines for conditions such as cancer, organ transplantation, and autoimmune conditions.  Hepatitis C blood testing is recommended for all people born from [birth year] through 1965 and any individual with known risks for hepatitis C.  Practice safe sex. Use condoms and avoid high-risk sexual practices to reduce the spread of sexually transmitted infections (STIs). STIs include gonorrhea, chlamydia, syphilis, trichomonas, herpes, HPV, and human immunodeficiency virus (HIV). Herpes, HIV, and HPV are viral illnesses that have no cure. They can result in disability, cancer, and death.  If  you are a man who has sex with other men, you should be screened at least once per year for:  HIV.  Urethral, rectal, and pharyngeal infection of gonorrhea, chlamydia, or both.  If you are at risk of being infected with HIV, it is recommended that you take a prescription medicine daily to prevent HIV infection. This is called preexposure prophylaxis (PrEP). You are considered at risk if:  You are a man who has sex with other men (MSM) and have other risk factors.  You are a heterosexual man, are sexually active, and are at increased risk for HIV infection.  You take drugs by injection.  You are sexually active with a partner who has HIV.  Talk with your health care provider about whether you are at high risk of being infected with HIV. If you choose to begin PrEP, you should first be tested for HIV. You should then be tested every 3 months for as long as you are taking PrEP.  A one-time screening for abdominal aortic aneurysm (AAA) and  surgical repair of large AAAs by ultrasound are recommended for men ages 6 to 27 years who are current or former smokers.  Healthy men should no longer receive prostate-specific antigen (PSA) blood tests as part of routine cancer screening. Talk with your health care provider about prostate cancer screening.  Testicular cancer screening is not recommended for adult males who have no symptoms. Screening includes self-exam, a health care provider exam, and other screening tests. Consult with your health care provider about any symptoms you have or any concerns you have about testicular cancer.  Use sunscreen. Apply sunscreen liberally and repeatedly throughout the day. You should seek shade when your shadow is shorter than you. Protect yourself by wearing long sleeves, pants, a wide-brimmed hat, and sunglasses year round, whenever you are outdoors.  Once a month, do a whole-body skin exam, using a mirror to look at the skin on your back. Tell your health care provider about new moles, moles that have irregular borders, moles that are larger than a pencil eraser, or moles that have changed in shape or color.  Stay current with required vaccines (immunizations).  Influenza vaccine. All adults should be immunized every year.  Tetanus, diphtheria, and acellular pertussis (Td, Tdap) vaccine. An adult who has not previously received Tdap or who does not know his vaccine status should receive 1 dose of Tdap. This initial dose should be followed by tetanus and diphtheria toxoids (Td) booster doses every 10 years. Adults with an unknown or incomplete history of completing a 3-dose immunization series with Td-containing vaccines should begin or complete a primary immunization series including a Tdap dose. Adults should receive a Td booster every 10 years.  Varicella vaccine. An adult without evidence of immunity to varicella should receive 2 doses or a second dose if he has previously received 1 dose.  Human  papillomavirus (HPV) vaccine. Males aged 11-21 years who have not received the vaccine previously should receive the 3-dose series. Males aged 22-26 years may be immunized. Immunization is recommended through the age of 60 years for any male who has sex with males and did not get any or all doses earlier. Immunization is recommended for any person with an immunocompromised condition through the age of 55 years if he did not get any or all doses earlier. During the 3-dose series, the second dose should be obtained 4-8 weeks after the first dose. The third dose should be obtained 24 weeks after the  first dose and 16 weeks after the second dose.  Zoster vaccine. One dose is recommended for adults aged 6 years or older unless certain conditions are present.  Measles, mumps, and rubella (MMR) vaccine. Adults born before [birth year] generally are considered immune to measles and mumps. Adults born in [birth year] or later should have 1 or more doses of MMR vaccine unless there is a contraindication to the vaccine or there is laboratory evidence of immunity to each of the three diseases. A routine second dose of MMR vaccine should be obtained at least 28 days after the first dose for students attending postsecondary schools, health care workers, or international travelers. People who received inactivated measles vaccine or an unknown type of measles vaccine during 1963-1967 should receive 2 doses of MMR vaccine. People who received inactivated mumps vaccine or an unknown type of mumps vaccine before 1979 and are at high risk for mumps infection should consider immunization with 2 doses of MMR vaccine. Unvaccinated health care workers born before [birth year] who lack laboratory evidence of measles, mumps, or rubella immunity or laboratory confirmation of disease should consider measles and mumps immunization with 2 doses of MMR vaccine or rubella immunization with 1 dose of MMR vaccine.  Pneumococcal 13-valent conjugate (PCV13) vaccine.  When indicated, a person who is uncertain of his immunization history and has no record of immunization should receive the PCV13 vaccine. All adults 71 years of age and older should receive this vaccine. An adult aged 70 years or older who has certain medical conditions and has not been previously immunized should receive 1 dose of PCV13 vaccine. This PCV13 should be followed with a dose of pneumococcal polysaccharide (PPSV23) vaccine. Adults who are at high risk for pneumococcal disease should obtain the PPSV23 vaccine at least 8 weeks after the dose of PCV13 vaccine. Adults older than 57 years of age who have normal immune system function should obtain the PPSV23 vaccine dose at least 1 year after the dose of PCV13 vaccine.  Pneumococcal polysaccharide (PPSV23) vaccine. When PCV13 is also indicated, PCV13 should be obtained first. All adults aged 49 years and older should be immunized. An adult younger than age 45 years who has certain medical conditions should be immunized. Any person who resides in a nursing home or long-term care facility should be immunized. An adult smoker should be immunized. People with an immunocompromised condition and certain other conditions should receive both PCV13 and PPSV23 vaccines. People with human immunodeficiency virus (HIV) infection should be immunized as soon as possible after diagnosis. Immunization during chemotherapy or radiation therapy should be avoided. Routine use of PPSV23 vaccine is not recommended for American Indians, [place] Natives, or people younger than 65 years unless there are medical conditions that require PPSV23 vaccine. When indicated, people who have unknown immunization and have no record of immunization should receive PPSV23 vaccine. One-time revaccination 5 years after the first dose of PPSV23 is recommended for people aged 19-64 years who have chronic kidney failure, nephrotic syndrome, asplenia, or immunocompromised conditions. People who received  1-2 doses of PPSV23 before age 52 years should receive another dose of PPSV23 vaccine at age 27 years or later if at least 5 years have passed since the previous dose. Doses of PPSV23 are not needed for people immunized with PPSV23 at or after age 7 years.  Meningococcal vaccine. Adults with asplenia or persistent complement component deficiencies should receive 2 doses of quadrivalent meningococcal conjugate (MenACWY-D) vaccine. The doses should be obtained at least 2 months apart.  Microbiologists working with certain meningococcal bacteria, [place] recruits, people at risk during an outbreak, and people who travel to or live in countries with a high rate of meningitis should be immunized. A first-year college student up through age 84 years who is living in a residence hall should receive a dose if he did not receive a dose on or after his 16th birthday. Adults who have certain high-risk conditions should receive one or more doses of vaccine.  Hepatitis A vaccine. Adults who wish to be protected from this disease, have chronic liver disease, work with hepatitis A-infected animals, work in hepatitis A research labs, or travel to or work in countries with a high rate of hepatitis A should be immunized. Adults who were previously unvaccinated and who anticipate close contact with an international adoptee during the first 60 days after arrival in the [place] from a country with a high rate of hepatitis A should be immunized.  Hepatitis B vaccine. Adults should be immunized if they wish to be protected from this disease, are under age 42 years and have diabetes, have chronic liver disease, have had more than one sex partner in the past 6 months, may be exposed to blood or other infectious body fluids, are household contacts or sex partners of hepatitis B positive people, are clients or workers in certain care facilities, or travel to or work in countries with a high rate of hepatitis B.  Haemophilus  influenzae type b (Hib) vaccine. A previously unvaccinated person with asplenia or sickle cell disease or having a scheduled splenectomy should receive 1 dose of Hib vaccine. Regardless of previous immunization, a recipient of a hematopoietic stem cell transplant should receive a 3-dose series 6-12 months after his successful transplant. Hib vaccine is not recommended for adults with HIV infection. Preventive Service / Frequency Ages 18 to 48  Blood pressure check.** / Every year.  Lipid and cholesterol check.** / Every 5 years beginning at age 41.  Lung cancer screening. / Every year if you are aged 25-80 years and have a 30-pack-year history of smoking and currently smoke or have quit within the past 15 years. Yearly screening is stopped once you have quit smoking for at least 15 years or develop a health problem that would prevent you from having lung cancer treatment.  Fecal occult blood test (FOBT) of stool. / Every year beginning at age 93 and continuing until age 35. You may not have to do this test if you get a colonoscopy every 10 years.  Flexible sigmoidoscopy** or colonoscopy.** / Every 5 years for a flexible sigmoidoscopy or every 10 years for a colonoscopy beginning at age 64 and continuing until age 4.  Hepatitis C blood test.** / For all people born from [birth year] through 1965 and any individual with known risks for hepatitis C.  Skin self-exam. / Monthly.  Influenza vaccine. / Every year.  Tetanus, diphtheria, and acellular pertussis (Tdap/Td) vaccine.** / Consult your health care provider. 1 dose of Td every 10 years.  Varicella vaccine.** / Consult your health care provider.  Zoster vaccine.** / 1 dose for adults aged 42 years or older.  Measles, mumps, rubella (MMR) vaccine.** / You need at least 1 dose of MMR if you were born in 1957 or later. You may also need a second dose.  Pneumococcal 13-valent conjugate (PCV13) vaccine.** / Consult your health care  provider.  Pneumococcal polysaccharide (PPSV23) vaccine.** / 1 to 2 doses if you smoke cigarettes or if you have certain  conditions.  Meningococcal vaccine.** / Consult your health care provider.  Hepatitis A vaccine.** / Consult your health care provider.  Hepatitis B vaccine.** / Consult your health care provider.  Haemophilus influenzae type b (Hib) vaccine.** / Consult your health care provider. Ages 56 and over  Blood pressure check.** / Every year.  Lipid and cholesterol check.**/ Every 5 years beginning at age 13.  Lung cancer screening. / Every year if you are aged 93-80 years and have a 30-pack-year history of smoking and currently smoke or have quit within the past 15 years. Yearly screening is stopped once you have quit smoking for at least 15 years or develop a health problem that would prevent you from having lung cancer treatment.  Fecal occult blood test (FOBT) of stool. / Every year beginning at age 25 and continuing until age 65. You may not have to do this test if you get a colonoscopy every 10 years.  Flexible sigmoidoscopy** or colonoscopy.** / Every 5 years for a flexible sigmoidoscopy or every 10 years for a colonoscopy beginning at age 15 and continuing until age 54.  Hepatitis C blood test.** / For all people born from [birth year] through 1965 and any individual with known risks for hepatitis C.  Abdominal aortic aneurysm (AAA) screening.** / A one-time screening for ages 69 to 90 years who are current or former smokers.  Skin self-exam. / Monthly.  Influenza vaccine. / Every year.  Tetanus, diphtheria, and acellular pertussis (Tdap/Td) vaccine.** / 1 dose of Td every 10 years.  Varicella vaccine.** / Consult your health care provider.  Zoster vaccine.** / 1 dose for adults aged 72 years or older.  Pneumococcal 13-valent conjugate (PCV13) vaccine.** / 1 dose for all adults aged 70 years and older.  Pneumococcal polysaccharide (PPSV23) vaccine.** / 1 dose for  all adults aged 72 years and older.  Meningococcal vaccine.** / Consult your health care provider.  Hepatitis A vaccine.** / Consult your health care provider.  Hepatitis B vaccine.** / Consult your health care provider.  Haemophilus influenzae type b (Hib) vaccine.** / Consult your health care provider. **Family history and personal history of risk and conditions may change your health care provider's recommendations.   This information is not intended to replace advice given to you by your health care provider. Make sure you discuss any questions you have with your health care provider.   Document Released: 03/03/2001 Document Revised: 01/26/2014 Document Reviewed: 06/02/2010 [employer]vier Interactive Patient Education [employer].

## 2017-01-26 NOTE — Progress Notes (Signed)
Male physical  Impression and Recommendations:    1. Encounter for wellness examination   2. Elevated PSA measurement   3. Vaccine counseling- shingrix      1) Anticipatory Guidance: Discussed importance of wearing a seatbelt while driving, not texting while driving;   sunscreen when outside along with skin surveillance; eating a balanced and modest diet; physical activity at least 25 minutes per day or 150 min/ week moderate to intense activity.  2) Immunizations / Screenings / Labs:  All immunizations are up-to-date per recommendations or will be updated today. Patient is due for dental and vision screens which pt will schedule independently. Will obtain CBC, CMP, HgA1c, Lipid panel, TSH and vit D when fasting, if not already done recently.   3) Weight:  BMI meaning discussed with patient.  Discussed goal of losing 5-10% of current body weight which would improve overall feelings of well being and improve objective health data. Improve nutrient density of diet through increasing intake of fruits and vegetables and decreasing saturated fats, white flour products and refined sugars.   4) Elevated PSA measurement: Pt sees a urologist regularly. Pt had negative biopsy 2017.  -Pt's is now taking sildenafil prescribed from his urologist and he takes typically 60 mg per usage and he is tolerating this well without complications.  Told pt I have no problems refilling his medications seen he was seen and evaluated by urology who felt no further workup was necessary.    -Pt recommended to use hemoccult at home to check for microscopic blood in the stool. Information and handouts provided.  -Pt recommended and counseled to obtain the Shingrix vaccination. Information and handouts provided.  -Pt acknowledges a separate office visit is necessary to discuss lab result abnormalities, prediabetes, vitamin D deficiency, and cholesterol findings, and will schedule a followup appointment to do this  at a later time.  Gross side effects, risk and benefits, and alternatives of medications discussed with patient.  Patient is aware that all medications have potential side effects and we are unable to predict every side effect or drug-drug interaction that may occur.  Expresses verbal understanding and consents to current therapy plan and treatment regimen.  Please see AVS handed out to patient at the end of our visit for further patient instructions/ counseling done pertaining to today's office visit.  Follow-up preventative CPE in 1 year. Follow-up office visit pending lab work.  F/up sooner for chronic care management and/or prn   This document serves as a record of services personally performed by Mellody Dance, DO. It was created on her behalf by Mayer Masker, a trained medical scribe. The creation of this record is based on the scribe's personal observations and the provider's statements to them. I have reviewed the above medical documentation for accuracy and completeness and I concur.  Mellody Dance 01/30/17 1:01 PM   Subjective:    CC: CPE  HPI: Keith Bullock is a 57 y.o. male who presents to [facility] today for a yearly health maintenance exam. Pt reports having mood and panic attack symptoms after taking the generic Flomax. Pt has a hernia repair surgery scheduled for Feb 23 2017. Pt has a concern regarding his valsartan prescription after seeing on the news about a recall. He denies any associated symptoms.   Penile: Pt is taking 20 mg sildenafil for his erectile dysfunction. He typically takes 60 mg per use. He denies any problems with ejaculation. He sees Dr. Shirlean Mylar, MD  in urology who prescribes this for him.  Eye: Pt is due for an eye appointment. He denies any visual problems. Told him to make appt  Dental:  Pt does not see a dentist at this time. Told him q 6 mo appts are rec  Dermatologist: Pt has a dermatologist that he sees  regularly.  Health Maintenance Summary Reviewed and updated, unless pt declines services.  Colonoscopy:   utd Tobacco History Reviewed:   No h/o smoking. CT scan for screening lung CA:   N/A Abdominal Ultrasound:    n/a Exercise Habits:   Pt works out and exercises regularly. STD concerns:   none Drug Use:   None Birth control method:   n/a Testicular/penile concerns:     N/A   Immunization History  Administered Date(s) Administered  . Influenza-Unspecified 10/27/2011, 11/25/2015, 11/13/2016  . Tdap 10/27/2011    Health Maintenance  Topic Date Due  . HIV Screening  08/12/2028 (Originally 09/05/1975)  . COLONOSCOPY  11/12/2020  . TETANUS/TDAP  10/26/2021  . INFLUENZA VACCINE  Completed  . Hepatitis C Screening  Completed      Wt Readings from Last 3 Encounters:  01/26/17 219 lb 14.4 oz (99.7 kg)  12/24/16 220 lb (99.8 kg)  12/14/16 220 lb 3.2 oz (99.9 kg)   BP Readings from Last 3 Encounters:  01/26/17 109/74  12/24/16 (!) 143/96  12/14/16 135/88   Pulse Readings from Last 3 Encounters:  01/26/17 88  12/24/16 75  12/14/16 87    Patient Active Problem List   Diagnosis Date Noted  . Hypertriglyceridemia 01/07/2016    Priority: High  . HLD (hyperlipidemia) 01/07/2016    Priority: High  . Essential hypertension 08/26/2015    Priority: High  . H/o back strain - M-SK and rad to sides/ front 01/07/2016    Priority: Medium  . Excessive drinking of alcohol 01/07/2016    Priority: Medium  . Obesity, Class I, BMI 30-34.9 01/07/2016    Priority: Medium  . h/o recent MRSA infection 08/26/2015    Priority: Medium  . Elevated PSA measurement 01/07/2016    Priority: Low  . Decreased libido 01/07/2016    Priority: Low  . ED (erectile dysfunction) 01/07/2016    Priority: Low  . Chronic periumbilical pain with hernia 12/14/2016  . Irreducible umbilical hernia 81/19/1478  . Callus of foot 12/14/2016  . Elevated fasting blood sugar- 08/2016 w derm labs 12/14/2016  .  Elevated LFTs 08/12/2016  . Myalgia due to statin 01/07/2016    Past Medical History:  Diagnosis Date  . Hypertension   . MRSA cellulitis     Past Surgical History:  Procedure Laterality Date  . ROTATOR CUFF REPAIR Left     Family History  Problem Relation Age of Onset  . Cancer Mother        Ovarian  . Depression Mother   . Hypertension Mother   . Cancer Father        Colon  . Diabetes Paternal Grandfather     Social History   Substance and Sexual Activity  Drug Use No  ,  Social History   Substance and Sexual Activity  Alcohol Use Yes   Comment: 2-3 a day  ,  Social History   Tobacco Use  Smoking Status Never Smoker  Smokeless Tobacco Never Used  ,  Social History   Substance and Sexual Activity  Sexual Activity Yes    Patient's Medications  New Prescriptions   No medications on file  Previous Medications  AMLODIPINE (NORVASC) 10 MG TABLET    Take 1 tablet (10 mg total) by mouth daily.   AMNESTEEM 40 MG CAPSULE    Take 1 tablet by mouth daily.   CYCLOBENZAPRINE (FLEXERIL) 10 MG TABLET    Take 1 tablet (10 mg total) by mouth 3 (three) times daily as needed for muscle spasms.   EZETIMIBE (ZETIA) 10 MG TABLET    Take 1 tablet (10 mg total) by mouth at bedtime.   NAPROXEN SODIUM (ANAPROX) 220 MG TABLET    Take 220 mg by mouth as needed.   SILDENAFIL (VIAGRA) 25 MG TABLET    Take 25 mg by mouth daily as needed for erectile dysfunction.   TRAMADOL (ULTRAM) 50 MG TABLET    Take 1 tablet (50 mg total) by mouth every 6 (six) hours as needed.   VALSARTAN-HYDROCHLOROTHIAZIDE (DIOVAN-HCT) 320-25 MG TABLET    Take 1 tablet by mouth daily.  Modified Medications   No medications on file  Discontinued Medications   No medications on file    Flomax [tamsulosin hcl]  Review of Systems: General:   Denies fever, chills, unexplained weight loss.  Optho/Auditory:   Denies visual changes, blurred vision/LOV Respiratory:   Denies SOB, DOE more than baseline levels.   Cardiovascular:   Denies chest pain, palpitations, new onset peripheral edema  Gastrointestinal:   Denies nausea, vomiting, diarrhea.  Genitourinary: Denies dysuria, freq/ urgency, flank pain or discharge from genitals.  Endocrine:     Denies hot or cold intolerance, polyuria, polydipsia. Musculoskeletal:   Denies unexplained myalgias, joint swelling, unexplained arthralgias, gait problems.  Skin:  Denies rash, suspicious lesions Neurological:     Denies dizziness, unexplained weakness, numbness  Psychiatric/Behavioral:   Denies mood changes, suicidal or homicidal ideations, hallucinations    Objective:     Blood pressure 109/74, pulse 88, height 5\' 10"  (1.778 m), weight 219 lb 14.4 oz (99.7 kg), SpO2 98 %. Body mass index is 31.55 kg/m. General Appearance:    Alert, cooperative, no distress, appears stated age  Head:    Normocephalic, without obvious abnormality, atraumatic  Eyes:    PERRL, conjunctiva/corneas clear, EOM's intact, fundi    benign, both eyes  Ears:    Normal TM's and external ear canals, both ears  Nose:   Nares normal, septum midline, mucosa normal, no drainage    or sinus tenderness  Throat:   Lips w/o lesion, mucosa moist, and tongue normal; teeth and   gums normal  Neck:   Supple, symmetrical, trachea midline, no adenopathy;    thyroid:  no enlargement/tenderness/nodules; no carotid   bruit or JVD  Back:     Symmetric, no curvature, ROM normal, no CVA tenderness  Lungs:     Clear to auscultation bilaterally, respirations unlabored, no       Wh/ R/ R  Chest Wall:    No tenderness or gross deformity; normal excursion   Heart:    Regular rate and rhythm, S1 and S2 normal, no murmur, rub   or gallop  Abdomen:     Soft, non-tender, bowel sounds active all four quadrants, NO   G/R/R, no masses, no organomegaly. Pt has periumbilical hernia which is reducible.  Genitalia:   deferred to uro   Rectal:    Deferred to uro  Extremities:   Extremities normal, atraumatic,  no cyanosis or gross edema  Pulses:   2+ and symmetric all extremities  Skin:   Warm, dry, Skin color, texture, turgor normal, no obvious rashes or lesions,  but has Derm  M-Sk:   Ambulates * 4 w/o difficulty, no gross deformities, tone WNL  Neurologic:   CNII-XII intact, normal strength, sensation and reflexes    Throughout Psych:  No HI/SI, judgement and insight good, Euthymic mood. Full Affect.

## 2017-01-27 NOTE — Telephone Encounter (Signed)
Left vm to schedule a follow up apt patient requested 1/9/19CS

## 2017-02-08 DIAGNOSIS — Z5181 Encounter for therapeutic drug level monitoring: Secondary | ICD-10-CM | POA: Diagnosis not present

## 2017-02-08 DIAGNOSIS — L723 Sebaceous cyst: Secondary | ICD-10-CM | POA: Diagnosis not present

## 2017-02-08 DIAGNOSIS — L700 Acne vulgaris: Secondary | ICD-10-CM | POA: Diagnosis not present

## 2017-02-09 ENCOUNTER — Encounter: Payer: Self-pay | Admitting: Podiatry

## 2017-02-09 ENCOUNTER — Ambulatory Visit (INDEPENDENT_AMBULATORY_CARE_PROVIDER_SITE_OTHER): Payer: BLUE CROSS/BLUE SHIELD | Admitting: Podiatry

## 2017-02-09 DIAGNOSIS — M205X1 Other deformities of toe(s) (acquired), right foot: Secondary | ICD-10-CM | POA: Diagnosis not present

## 2017-02-09 DIAGNOSIS — M2021 Hallux rigidus, right foot: Secondary | ICD-10-CM

## 2017-02-09 DIAGNOSIS — M7752 Other enthesopathy of left foot: Secondary | ICD-10-CM

## 2017-02-10 NOTE — Progress Notes (Signed)
Subjective: 57 year old male presents the office to discuss the MRI results.  He has noticed a mass in the ball of his left foot underneath the big toe is been ongoing for the last several months.  The area is painful when he walks a certain way.  Denies any recent injury or trauma no swelling or redness.  He has no other concerns today. Denies any systemic complaints such as fevers, chills, nausea, vomiting. No acute changes since last appointment, and no other complaints at this time.   Objective: AAO x3, NAD DP/PT pulses palpable bilaterally, CRT less than 3 seconds On the left foot submetatarsal one appears to be a soft tissue mass which is mildly fluid-filled but unable to feel any areas of fluctuance or crepitus.  There is no overlying erythema or increase in warmth.  Mild tenderness palpation of the area but most of his tenderness is with weightbearing.  Mild decreasing MPJ range of motion.  No other area of tenderness identified at this time. No open lesions or pre-ulcerative lesions.  No pain with calf compression, swelling, warmth, erythema  MRI 01/22/2017 LEFT foot: IMPRESSION: Multi-septated T2 hyperintense collection in the plantar soft tissues deep to the first MTP joint has an appearance most compatible with an adventitial bursa although it cannot be definitively characterized.  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  Fluid in the first and third intermetatarsal spaces compatible with intermetatarsal bursitis.  Crossing first and second toes.  Clawtoe deformities throughout.  Assessment: Bursa left foot likely due to underlying first MPJ arthritis  Plan: -All treatment options discussed with the patient including all alternatives, risks, complications.  -We discussed the MRI results we discussed both conservative as well as surgical treatment options.  He has had no treatment at this point.  We discussed a steroid injection to see if we can help shrink  this.  He wishes to proceed with this.  After verbal consent was obtained the area was prepped with alcohol and a mixture of 1 cc Kenalog 10 as well as 1 cc 0.5% Marcaine plain 1 cc of 2% lidocaine plain was infiltrated into the area of the mass.  This was completed without complications and he tolerated well. -We also discussed that we do something in order to help take pressure off the area as this is the likely recur.  Discussed with him a custom molded orthotic.  We will get him in to see Liliane Channel to get molded for this. -If it continues to discuss possible surgical intervention.  He agrees this plan has no further questions or concerns.  Trula Slade DPM  -Patient encouraged to call the office with any questions, concerns, change in symptoms.

## 2017-02-16 ENCOUNTER — Ambulatory Visit (INDEPENDENT_AMBULATORY_CARE_PROVIDER_SITE_OTHER): Payer: BLUE CROSS/BLUE SHIELD | Admitting: Orthotics

## 2017-02-16 DIAGNOSIS — M720 Palmar fascial fibromatosis [Dupuytren]: Secondary | ICD-10-CM

## 2017-02-16 DIAGNOSIS — M205X1 Other deformities of toe(s) (acquired), right foot: Secondary | ICD-10-CM | POA: Diagnosis not present

## 2017-02-16 DIAGNOSIS — M7752 Other enthesopathy of left foot: Secondary | ICD-10-CM

## 2017-02-16 DIAGNOSIS — M2021 Hallux rigidus, right foot: Principal | ICD-10-CM

## 2017-02-16 NOTE — Progress Notes (Signed)
Patient evaluated/cast for custom foot orthoics  According to dr Jacqualyn Posey, patient has soft mass and bursitis of left foot; along with some OA in joint.  Plan on Richy fab device with k-wedge cut out to reliev pressure on 1st, also reverse Morton's to enhance windlass mech and take further pressure off joint by lowering first ray.

## 2017-02-17 NOTE — Pre-Procedure Instructions (Signed)
CAYLE THUNDER  02/17/2017      [facility] - [place], [place] - [address] [address] Phone: [phone] Fax: [phone]    Your procedure is scheduled on February 23, 2017.  Report to [facility] Admitting at 530 AM.  Call this number if you have problems the morning of surgery:  [phone]   Remember:  Do not eat food or drink liquids after midnight.  Take these medicines the morning of surgery with A SIP OF WATER amlodipine (norvasc), cetirizine (zyrtec), Pepcid, eye drops.  7 days prior to surgery STOP taking any Aspirin (unless otherwise instructed by your surgeon), Aleve, Naproxen, Ibuprofen, Motrin, Advil, Goody's, BC's, all herbal medications, fish oil, and all vitamins  Continue all other medications as instructed by your physician except follow the above medication instructions before surgery   Do not wear jewelry, make-up or nail polish.  Do not wear lotions, powders, or perfumes, or deodorant.  Men may shave face and neck.  Do not bring valuables to the hospital.  [facility] is not responsible for any belongings or valuables.  Contacts, dentures or bridgework may not be worn into surgery.  Leave your suitcase in the car.  After surgery it may be brought to your room.  For patients admitted to the hospital, discharge time will be determined by your treatment team.  Patients discharged the day of surgery will not be allowed to drive home.   Special instructions:   [place]- Preparing For Surgery  Before surgery, you can play an important role. Because skin is not sterile, your skin needs to be as free of germs as possible. You can reduce the number of germs on your skin by washing with CHG (chlorahexidine gluconate) Soap before surgery.  CHG is an antiseptic cleaner which kills germs and bonds with the skin to continue killing germs even after washing.  Please do not use if you have an allergy  to CHG or antibacterial soaps. If your skin becomes reddened/irritated stop using the CHG.  Do not shave (including legs and underarms) for at least 48 hours prior to first CHG shower. It is OK to shave your face.  Please follow these instructions carefully.   1. Shower the NIGHT BEFORE SURGERY and the MORNING OF SURGERY with CHG.   2. If you chose to wash your hair, wash your hair first as usual with your normal shampoo.  3. After you shampoo, rinse your hair and body thoroughly to remove the shampoo.  4. Use CHG as you would any other liquid soap. You can apply CHG directly to the skin and wash gently with a scrungie or a clean washcloth.   5. Apply the CHG Soap to your body ONLY FROM THE NECK DOWN.  Do not use on open wounds or open sores. Avoid contact with your eyes, ears, mouth and genitals (private parts). Wash Face and genitals (private parts)  with your normal soap.  6. Wash thoroughly, paying special attention to the area where your surgery will be performed.  7. Thoroughly rinse your body with warm water from the neck down.  8. DO NOT shower/wash with your normal soap after using and rinsing off the CHG Soap.  9. Pat yourself dry with a CLEAN TOWEL.  10. Wear CLEAN PAJAMAS to bed the night before surgery, wear comfortable clothes the morning of surgery  11. Place CLEAN SHEETS on your bed the night of your  first shower and DO NOT SLEEP WITH PETS.  Day of Surgery: Do not apply any deodorants/lotions. Please wear clean clothes to the hospital/surgery center.    Please read over the following fact sheets that you were given. Pain Booklet, Coughing and Deep Breathing and Surgical Site Infection Prevention

## 2017-02-18 ENCOUNTER — Encounter (HOSPITAL_COMMUNITY)
Admission: RE | Admit: 2017-02-18 | Discharge: 2017-02-18 | Disposition: A | Discharge status: Home / Self Care | Payer: BLUE CROSS/BLUE SHIELD | Source: Ambulatory Visit | Attending: General Surgery | Admitting: General Surgery

## 2017-02-18 ENCOUNTER — Encounter (HOSPITAL_COMMUNITY): Payer: Self-pay

## 2017-02-18 DIAGNOSIS — I10 Essential (primary) hypertension: Secondary | ICD-10-CM | POA: Insufficient documentation

## 2017-02-18 DIAGNOSIS — Z01812 Encounter for preprocedural laboratory examination: Secondary | ICD-10-CM | POA: Diagnosis not present

## 2017-02-18 DIAGNOSIS — Z01810 Encounter for preprocedural cardiovascular examination: Secondary | ICD-10-CM | POA: Diagnosis not present

## 2017-02-18 LAB — BASIC METABOLIC PANEL
Anion gap: 9 (ref 5–15)
BUN: 18 mg/dL (ref 6–20)
CHLORIDE: 105 mmol/L (ref 101–111)
CO2: 23 mmol/L (ref 22–32)
Calcium: 9.4 mg/dL (ref 8.9–10.3)
Creatinine, Ser: 1.05 mg/dL (ref 0.61–1.24)
GFR calc non Af Amer: >60 mL/min (ref >60)
Glucose, Bld: 128 mg/dL — ABNORMAL HIGH (ref 65–99)
POTASSIUM: 3.7 mmol/L (ref 3.5–5.1)
Sodium: 137 mmol/L (ref 135–145)

## 2017-02-18 LAB — CBC
HEMATOCRIT: 43.3 % (ref 39.0–52.0)
HEMOGLOBIN: 15.1 g/dL (ref 13.0–17.0)
MCH: 31.7 pg (ref 26.0–34.0)
MCHC: 34.9 g/dL (ref 30.0–36.0)
MCV: 90.8 fL (ref 78.0–100.0)
Platelets: 247 10*3/uL (ref 150–400)
RBC: 4.77 MIL/uL (ref 4.22–5.81)
RDW: 12.1 % (ref 11.5–15.5)
WBC: 4.7 10*3/uL (ref 4.0–10.5)

## 2017-02-18 MED ORDER — CHLORHEXIDINE GLUCONATE CLOTH 2 % EX PADS: 6.0000 | MEDICATED_PAD | Freq: Once | CUTANEOUS | Status: DC

## 2017-02-23 ENCOUNTER — Encounter (HOSPITAL_COMMUNITY)
Admission: RE | Disposition: A | Discharge status: Home / Self Care | Payer: Self-pay | Source: Ambulatory Visit | Attending: General Surgery

## 2017-02-23 ENCOUNTER — Ambulatory Visit (HOSPITAL_COMMUNITY)
Admission: RE | Admit: 2017-02-23 | Discharge: 2017-02-23 | Disposition: A | Discharge status: Home / Self Care | Payer: BLUE CROSS/BLUE SHIELD | Source: Ambulatory Visit | Attending: General Surgery | Admitting: General Surgery

## 2017-02-23 ENCOUNTER — Ambulatory Visit (HOSPITAL_COMMUNITY): Payer: BLUE CROSS/BLUE SHIELD | Admitting: Certified Registered"

## 2017-02-23 ENCOUNTER — Encounter (HOSPITAL_COMMUNITY): Payer: Self-pay | Admitting: *Deleted

## 2017-02-23 DIAGNOSIS — N400 Enlarged prostate without lower urinary tract symptoms: Secondary | ICD-10-CM | POA: Diagnosis not present

## 2017-02-23 DIAGNOSIS — I10 Essential (primary) hypertension: Secondary | ICD-10-CM | POA: Insufficient documentation

## 2017-02-23 DIAGNOSIS — Z888 Allergy status to other drugs, medicaments and biological substances status: Secondary | ICD-10-CM | POA: Insufficient documentation

## 2017-02-23 DIAGNOSIS — E7800 Pure hypercholesterolemia, unspecified: Secondary | ICD-10-CM | POA: Diagnosis not present

## 2017-02-23 DIAGNOSIS — Z86010 Personal history of colonic polyps: Secondary | ICD-10-CM | POA: Diagnosis not present

## 2017-02-23 DIAGNOSIS — K420 Umbilical hernia with obstruction, without gangrene: Secondary | ICD-10-CM | POA: Insufficient documentation

## 2017-02-23 DIAGNOSIS — Z79899 Other long term (current) drug therapy: Secondary | ICD-10-CM | POA: Diagnosis not present

## 2017-02-23 DIAGNOSIS — M25776 Osteophyte, unspecified foot: Secondary | ICD-10-CM | POA: Diagnosis not present

## 2017-02-23 DIAGNOSIS — N529 Male erectile dysfunction, unspecified: Secondary | ICD-10-CM | POA: Diagnosis not present

## 2017-02-23 DIAGNOSIS — K429 Umbilical hernia without obstruction or gangrene: Secondary | ICD-10-CM | POA: Diagnosis not present

## 2017-02-23 HISTORY — PX: INSERTION OF MESH: SHX5868

## 2017-02-23 HISTORY — PX: UMBILICAL HERNIA REPAIR: SHX196

## 2017-02-23 SURGERY — REPAIR, HERNIA, UMBILICAL, LAPAROSCOPIC
Anesthesia: General | Site: Abdomen

## 2017-02-23 MED ORDER — MIDAZOLAM HCL 5 MG/5ML IJ SOLN
INTRAMUSCULAR | Status: DC | PRN
  Administered 2017-02-23: 2 mg via INTRAVENOUS

## 2017-02-23 MED ORDER — PHENYLEPHRINE 40 MCG/ML (10ML) SYRINGE FOR IV PUSH (FOR BLOOD PRESSURE SUPPORT)
PREFILLED_SYRINGE | INTRAVENOUS | Status: AC
  Filled 2017-02-23: qty 10

## 2017-02-23 MED ORDER — FENTANYL CITRATE (PF) 100 MCG/2ML IJ SOLN
INTRAMUSCULAR | Status: DC | PRN
  Administered 2017-02-23 (×2): 50 ug via INTRAVENOUS
  Administered 2017-02-23: 100 ug via INTRAVENOUS
  Administered 2017-02-23: 50 ug via INTRAVENOUS

## 2017-02-23 MED ORDER — OXYCODONE HCL 5 MG PO TABS
ORAL_TABLET | ORAL | Status: AC
  Filled 2017-02-23: qty 1

## 2017-02-23 MED ORDER — GLYCOPYRROLATE 0.2 MG/ML IJ SOLN
INTRAMUSCULAR | Status: DC | PRN
  Administered 2017-02-23: .3 mg via INTRAVENOUS

## 2017-02-23 MED ORDER — MEPERIDINE HCL 25 MG/ML IJ SOLN: 6.2500 mg | INTRAMUSCULAR | Status: DC | PRN

## 2017-02-23 MED ORDER — OXYCODONE HCL 5 MG PO TABS
5.0000 mg | ORAL_TABLET | Freq: Once | ORAL | Status: AC | PRN
  Administered 2017-02-23: 5 mg via ORAL

## 2017-02-23 MED ORDER — PROMETHAZINE HCL 25 MG/ML IJ SOLN: 6.2500 mg | INTRAMUSCULAR | Status: DC | PRN

## 2017-02-23 MED ORDER — GABAPENTIN 300 MG PO CAPS
300.0000 mg | ORAL_CAPSULE | ORAL | Status: AC
  Administered 2017-02-23: 300 mg via ORAL
  Filled 2017-02-23: qty 1

## 2017-02-23 MED ORDER — CEFAZOLIN SODIUM-DEXTROSE 2-4 GM/100ML-% IV SOLN
2.0000 g | INTRAVENOUS | Status: AC
Start: 2017-02-23 — End: 2017-02-23
  Administered 2017-02-23: 2 g via INTRAVENOUS
  Filled 2017-02-23: qty 100

## 2017-02-23 MED ORDER — BUPIVACAINE HCL (PF) 0.25 % IJ SOLN
INTRAMUSCULAR | Status: AC
  Filled 2017-02-23: qty 30

## 2017-02-23 MED ORDER — PROPOFOL 10 MG/ML IV BOLUS
INTRAVENOUS | Status: DC | PRN
  Administered 2017-02-23: 170 mg via INTRAVENOUS

## 2017-02-23 MED ORDER — FENTANYL CITRATE (PF) 250 MCG/5ML IJ SOLN
INTRAMUSCULAR | Status: AC
  Filled 2017-02-23: qty 5

## 2017-02-23 MED ORDER — CELECOXIB 200 MG PO CAPS
ORAL_CAPSULE | ORAL | Status: AC
  Filled 2017-02-23: qty 1

## 2017-02-23 MED ORDER — HYDROMORPHONE HCL 1 MG/ML IJ SOLN
0.2500 mg | INTRAMUSCULAR | Status: DC | PRN
  Administered 2017-02-23: 0.5 mg via INTRAVENOUS

## 2017-02-23 MED ORDER — LIDOCAINE HCL (CARDIAC) 20 MG/ML IV SOLN
INTRAVENOUS | Status: DC | PRN
  Administered 2017-02-23: 60 mg via INTRAVENOUS

## 2017-02-23 MED ORDER — OXYCODONE HCL 5 MG/5ML PO SOLN: 5.0000 mg | Freq: Once | ORAL | Status: AC | PRN

## 2017-02-23 MED ORDER — HYDROMORPHONE HCL 1 MG/ML IJ SOLN
INTRAMUSCULAR | Status: AC
  Filled 2017-02-23: qty 1

## 2017-02-23 MED ORDER — PHENYLEPHRINE HCL 10 MG/ML IJ SOLN
INTRAVENOUS | Status: DC | PRN
  Administered 2017-02-23: 50 ug/min via INTRAVENOUS

## 2017-02-23 MED ORDER — ROCURONIUM BROMIDE 100 MG/10ML IV SOLN
INTRAVENOUS | Status: DC | PRN
  Administered 2017-02-23: 50 mg via INTRAVENOUS
  Administered 2017-02-23: 10 mg via INTRAVENOUS

## 2017-02-23 MED ORDER — EPHEDRINE SULFATE 50 MG/ML IJ SOLN
INTRAMUSCULAR | Status: DC | PRN
  Administered 2017-02-23 (×2): 10 mg via INTRAVENOUS

## 2017-02-23 MED ORDER — CELECOXIB 200 MG PO CAPS
200.0000 mg | ORAL_CAPSULE | ORAL | Status: AC
  Administered 2017-02-23: 200 mg via ORAL

## 2017-02-23 MED ORDER — LACTATED RINGERS IV SOLN
INTRAVENOUS | Status: DC | PRN
  Administered 2017-02-23: 07:00:00 via INTRAVENOUS

## 2017-02-23 MED ORDER — ONDANSETRON HCL 4 MG/2ML IJ SOLN
INTRAMUSCULAR | Status: DC | PRN
  Administered 2017-02-23: 4 mg via INTRAVENOUS

## 2017-02-23 MED ORDER — SUGAMMADEX SODIUM 200 MG/2ML IV SOLN
INTRAVENOUS | Status: DC | PRN
  Administered 2017-02-23: 200 mg via INTRAVENOUS

## 2017-02-23 MED ORDER — 0.9 % SODIUM CHLORIDE (POUR BTL) OPTIME
TOPICAL | Status: DC | PRN
  Administered 2017-02-23: 1000 mL

## 2017-02-23 MED ORDER — MIDAZOLAM HCL 2 MG/2ML IJ SOLN
INTRAMUSCULAR | Status: AC
  Filled 2017-02-23: qty 2

## 2017-02-23 MED ORDER — STERILE WATER FOR IRRIGATION IR SOLN
Status: DC | PRN
  Administered 2017-02-23: 200 mL

## 2017-02-23 MED ORDER — PHENYLEPHRINE HCL 10 MG/ML IJ SOLN
INTRAMUSCULAR | Status: DC | PRN
  Administered 2017-02-23 (×3): 80 ug via INTRAVENOUS
  Administered 2017-02-23: 40 ug via INTRAVENOUS

## 2017-02-23 MED ORDER — BUPIVACAINE HCL 0.25 % IJ SOLN
INTRAMUSCULAR | Status: DC | PRN
  Administered 2017-02-23: 30 mL

## 2017-02-23 MED ORDER — ACETAMINOPHEN 500 MG PO TABS
1000.0000 mg | ORAL_TABLET | ORAL | Status: AC
  Administered 2017-02-23: 1000 mg via ORAL
  Filled 2017-02-23: qty 2

## 2017-02-23 MED ORDER — PROPOFOL 10 MG/ML IV BOLUS
INTRAVENOUS | Status: AC
  Filled 2017-02-23: qty 20

## 2017-02-23 SURGICAL SUPPLY — 59 items
APL SKNCLS STERI-STRIP NONHPOA (GAUZE/BANDAGES/DRESSINGS) ×1
APPLIER CLIP LOGIC TI 5 (MISCELLANEOUS) IMPLANT
APPLIER CLIP ROT 10 11.4 M/L (STAPLE)
APR CLP MED LRG 11.4X10 (STAPLE)
APR CLP MED LRG 33X5 (MISCELLANEOUS)
BENZOIN TINCTURE PRP APPL 2/3 (GAUZE/BANDAGES/DRESSINGS) ×2 IMPLANT
BLADE CLIPPER SURG (BLADE) ×1 IMPLANT
CANISTER SUCT 3000ML PPV (MISCELLANEOUS) IMPLANT
CHLORAPREP W/TINT 26ML (MISCELLANEOUS) ×2 IMPLANT
CLIP APPLIE ROT 10 11.4 M/L (STAPLE) IMPLANT
COVER SURGICAL LIGHT HANDLE (MISCELLANEOUS) ×2 IMPLANT
DEVICE SECURE STRAP 25 ABSORB (INSTRUMENTS) ×4 IMPLANT
DEVICE TROCAR PUNCTURE CLOSURE (ENDOMECHANICALS) ×1 IMPLANT
ELECT REM PT RETURN 9FT ADLT (ELECTROSURGICAL) ×2
ELECTRODE REM PT RTRN 9FT ADLT (ELECTROSURGICAL) ×1 IMPLANT
GAUZE SPONGE 2X2 8PLY STRL LF (GAUZE/BANDAGES/DRESSINGS) ×1 IMPLANT
GAUZE SPONGE 4X4 12PLY STRL LF (GAUZE/BANDAGES/DRESSINGS) ×1 IMPLANT
GLOVE BIO SURGEON STRL SZ7.5 (GLOVE) ×3 IMPLANT
GLOVE BIOGEL PI IND STRL 7.0 (GLOVE) ×1 IMPLANT
GLOVE BIOGEL PI IND STRL 7.5 (GLOVE) ×1 IMPLANT
GLOVE BIOGEL PI IND STRL 8 (GLOVE) IMPLANT
GLOVE BIOGEL PI INDICATOR 7.0 (GLOVE) ×1
GLOVE BIOGEL PI INDICATOR 7.5 (GLOVE) ×1
GLOVE BIOGEL PI INDICATOR 8 (GLOVE) ×1
GLOVE ECLIPSE 7.0 STRL STRAW (GLOVE) ×2 IMPLANT
GLOVE SURG SS PI 7.0 STRL IVOR (GLOVE) ×2 IMPLANT
GOWN STRL REUS W/ TWL LRG LVL3 (GOWN DISPOSABLE) ×3 IMPLANT
GOWN STRL REUS W/ TWL XL LVL3 (GOWN DISPOSABLE) ×1 IMPLANT
GOWN STRL REUS W/TWL LRG LVL3 (GOWN DISPOSABLE) ×6
GOWN STRL REUS W/TWL XL LVL3 (GOWN DISPOSABLE) ×2
GRASPER SUT TROCAR 14GX15 (MISCELLANEOUS) ×2 IMPLANT
KIT BASIN OR (CUSTOM PROCEDURE TRAY) ×2 IMPLANT
KIT ROOM TURNOVER OR (KITS) ×2 IMPLANT
MARKER SKIN DUAL TIP RULER LAB (MISCELLANEOUS) ×2 IMPLANT
MESH VENTRALIGHT ST 6IN CRC (Mesh General) ×1 IMPLANT
NDL INSUFFLATION 14GA 120MM (NEEDLE) ×1 IMPLANT
NDL SPNL 22GX3.5 QUINCKE BK (NEEDLE) IMPLANT
NEEDLE INSUFFLATION 14GA 120MM (NEEDLE) ×2 IMPLANT
NEEDLE SPNL 22GX3.5 QUINCKE BK (NEEDLE) IMPLANT
NS IRRIG 1000ML POUR BTL (IV SOLUTION) ×2 IMPLANT
PAD ARMBOARD 7.5X6 YLW CONV (MISCELLANEOUS) ×4 IMPLANT
SCISSORS LAP 5X35 DISP (ENDOMECHANICALS) ×2 IMPLANT
SET IRRIG TUBING LAPAROSCOPIC (IRRIGATION / IRRIGATOR) IMPLANT
SLEEVE ENDOPATH XCEL 5M (ENDOMECHANICALS) ×2 IMPLANT
SPONGE GAUZE 2X2 STER 10/PKG (GAUZE/BANDAGES/DRESSINGS) ×1
STRIP CLOSURE SKIN 1/2X4 (GAUZE/BANDAGES/DRESSINGS) ×2 IMPLANT
SUT CHROMIC 2 0 SH (SUTURE) ×2 IMPLANT
SUT ETHIBOND 0 MO6 C/R (SUTURE) ×2 IMPLANT
SUT MNCRL AB 4-0 PS2 18 (SUTURE) ×2 IMPLANT
SUT NOVA 1 T20/GS 25DT (SUTURE) IMPLANT
SUT PROLENE 2 0 KS (SUTURE) ×2 IMPLANT
TOWEL OR 17X24 6PK STRL BLUE (TOWEL DISPOSABLE) ×1 IMPLANT
TOWEL OR 17X26 10 PK STRL BLUE (TOWEL DISPOSABLE) ×2 IMPLANT
TRAY LAPAROSCOPIC MC (CUSTOM PROCEDURE TRAY) ×2 IMPLANT
TROCAR XCEL BLUNT TIP 100MML (ENDOMECHANICALS) IMPLANT
TROCAR XCEL NON-BLD 11X100MML (ENDOMECHANICALS) IMPLANT
TROCAR XCEL NON-BLD 5MMX100MML (ENDOMECHANICALS) ×2 IMPLANT
TUBING INSUFFLATION (TUBING) ×2 IMPLANT
WATER STERILE IRR 1000ML POUR (IV SOLUTION) ×2 IMPLANT

## 2017-02-23 NOTE — Progress Notes (Signed)
Spoke with Pharmacy about Flomax allergy  and Celebrex. States ok to give.

## 2017-02-23 NOTE — H&P (Signed)
History of Present Illness  The patient is a 57 year old male who presents with an umbilical hernia. Referred by: Dr. Dannial Monarch Chief Complaint: Umbilical hernia  Patient is a 57 year old male, with a history of hypercholesterolemia, hypertension, with a 5-6 month history of an umbilical hernia. He states got bigger. He does state his pain in that area. He does do some weightlifting recreationally. He states this appears had made it larger. He's had no signs or symptoms of incarceration or strangulation. He's had no previous abdominal surgeries. There are no other modifying factors.    Past Surgical History Colon Polyp Removal - Colonoscopy  Shoulder Surgery  Left.  Diagnostic Studies History Colonoscopy  1-5 years ago  Allergies  No Known Drug Allergies [01/07/2017]: Allergies Reconciled   Medication History ( TraMADol HCl (50MG  Tablet, Oral) Active. AmLODIPine Besylate (10MG  Tablet, Oral) Active. Amnesteem (40MG  Capsule, Oral) Active. Cyclobenzaprine HCl (10MG  Tablet, Oral) Active. Ezetimibe (10MG  Tablet, Oral) Active. Valsartan-Hydrochlorothiazide (320-25MG  Tablet, Oral) Active. Cialis (2.5MG  Tablet, Oral as needed) Active. Multivitamins (Oral) Active. Medications Reconciled  Social History Alcohol use  Heavy alcohol use. Caffeine use  Carbonated beverages, Coffee, Tea. No drug use  Tobacco use  Never smoker.  Family History  Colon Cancer  Father. Hypertension  Mother. Ovarian Cancer  Mother.  Other Problems  Back Pain  Enlarged Prostate  High blood pressure  Hypercholesterolemia  Umbilical Hernia Repair     Review of Systems General Not Present- Appetite Loss, Chills, Fatigue, Fever, Night Sweats, Weight Gain and Weight Loss. Skin Not Present- Change in Wart/Mole, Dryness, Hives, Jaundice, New Lesions, Non-Healing Wounds, Rash and Ulcer. HEENT Not Present- Earache, Hearing Loss, Hoarseness, Nose Bleed, Oral  Ulcers, Ringing in the Ears, Seasonal Allergies, Sinus Pain, Sore Throat, Visual Disturbances, Wears glasses/contact lenses and Yellow Eyes. Respiratory Not Present- Bloody sputum, Chronic Cough, Difficulty Breathing, Snoring and Wheezing. Breast Not Present- Breast Mass, Breast Pain, Nipple Discharge and Skin Changes. Cardiovascular Not Present- Chest Pain, Difficulty Breathing Lying Down, Leg Cramps, Palpitations, Rapid Heart Rate, Shortness of Breath and Swelling of Extremities. Gastrointestinal Present- Abdominal Pain. Not Present- Bloating, Bloody Stool, Change in Bowel Habits, Chronic diarrhea, Constipation, Difficulty Swallowing, Excessive gas, Gets full quickly at meals, Hemorrhoids, Indigestion, Nausea, Rectal Pain and Vomiting. Male Genitourinary Present- Frequency. Not Present- Blood in Urine, Change in Urinary Stream, Impotence, Nocturia, Painful Urination, Urgency and Urine Leakage. Musculoskeletal Not Present- Back Pain, Joint Pain, Joint Stiffness, Muscle Pain, Muscle Weakness and Swelling of Extremities. Neurological Not Present- Decreased Memory, Fainting, Headaches, Numbness, Seizures, Tingling, Tremor, Trouble walking and Weakness. Psychiatric Not Present- Anxiety, Bipolar, Change in Sleep Pattern, Depression, Fearful and Frequent crying. Endocrine Not Present- Cold Intolerance, Excessive Hunger, Hair Changes, Heat Intolerance, Hot flashes and New Diabetes. Hematology Not Present- Blood Thinners, Easy Bruising, Excessive bleeding, Gland problems, HIV and Persistent Infections. All other systems negative  Vitals [name] Dockery [name]; 28/41/3244 01:02 AM) BP 129/86   Pulse 69   Temp 98.4 F (36.9 C) (Oral)   Resp 18   Ht 5\' 10"  (1.778 m)   Wt 97.5 kg (215 lb)   SpO2 97%   BMI 30.85 kg/m     Physical Exam  The physical exam findings are as follows: Note:Constitutional: No acute distress, conversant, appears stated age  Eyes: Anicteric sclerae, moist conjunctiva, no  lid lag  Neck: No thyromegaly, trachea midline, no cervical lymphadenopathy  Lungs: Clear to auscultation biilaterally, normal respiratory effot  Cardiovascular: regular rate & rhythm, no murmurs, no peripheal edema, pedal  pulses 2+  GI: Soft, no masses or hepatosplenomegaly, non-tender to palpation  MSK: Normal gait, no clubbing cyanosis, edema  Skin: No rashes, palpation reveals normal skin turgor  Psychiatric: Appropriate judgment and insight, oriented to person, place, and time  Abdomen Inspection Hernias - Umbilical hernia - Incarcerated(Chronically incarcerated umbilical hernia).    Assessment & Plan  UMBILICAL HERNIA WITHOUT OBSTRUCTION AND WITHOUT GANGRENE (K42.9) Impression: 57 year old male with a chronically incarcerated umbilical hernia, history of hypertension, hypercholesterolemia. 1. The patient will like to proceed to the operating room for laparoscopic umbilical hernia repair with mesh.  2. I discussed with the patient the signs and symptoms of incarceration and strangulation and the need to proceed to the ER should they occur.  3. I discussed with the patient the risks and benefits of the procedure to include but not limited to: Infection, bleeding, damage to surrounding structures, possible need for further surgery, possible nerve pain, and possible recurrence. The patient was understanding and wishes to proceed.

## 2017-02-23 NOTE — Anesthesia Postprocedure Evaluation (Signed)
Anesthesia Post Note  Patient: Keith Bullock  Procedure(s) Performed: LAPAROSCOPIC UMBILICAL HERNIA REPAIR WITH MESH (N/A Abdomen) INSERTION OF MESH (N/A Abdomen)     Patient location during evaluation: PACU Anesthesia Type: General Level of consciousness: awake and alert Pain management: pain level controlled Vital Signs Assessment: post-procedure vital signs reviewed and stable Respiratory status: spontaneous breathing, nonlabored ventilation and respiratory function stable Cardiovascular status: blood pressure returned to baseline and stable Postop Assessment: no apparent nausea or vomiting Anesthetic complications: no    Last Vitals:  Vitals:   02/23/17 0945 02/23/17 1015  BP: 104/81 110/78  Pulse: 69 66  Resp: (!) 22 11  Temp:  36.7 C  SpO2: 96% 95%    Last Pain:  Vitals:   02/23/17 1015  TempSrc:   PainSc: 3                  Lynda Rainwater

## 2017-02-23 NOTE — Anesthesia Procedure Notes (Signed)
Procedure Name: Intubation Date/Time: 02/23/2017 7:35 AM Performed by: Lavell Luster, CRNA Pre-anesthesia Checklist: Patient identified, Emergency Drugs available, Suction available, Patient being monitored and Timeout performed Patient Re-evaluated:Patient Re-evaluated prior to induction Oxygen Delivery Method: Circle system utilized Preoxygenation: Pre-oxygenation with 100% oxygen Induction Type: IV induction Ventilation: Mask ventilation without difficulty Laryngoscope Size: Mac and 4 Grade View: Grade I Tube type: Oral Tube size: 7.5 mm Number of attempts: 1 Airway Equipment and Method: Stylet Placement Confirmation: ETT inserted through vocal cords under direct vision,  positive ETCO2 and breath sounds checked- equal and bilateral Secured at: 22 cm Tube secured with: Tape Dental Injury: Teeth and Oropharynx as per pre-operative assessment

## 2017-02-23 NOTE — Transfer of Care (Signed)
Immediate Anesthesia Transfer of Care Note  Patient: Keith Bullock  Procedure(s) Performed: LAPAROSCOPIC UMBILICAL HERNIA REPAIR WITH MESH (N/A Abdomen) INSERTION OF MESH (N/A Abdomen)  Patient Location: PACU  Anesthesia Type:General  Level of Consciousness: awake, alert  and oriented  Airway & Oxygen Therapy: Patient connected to face mask oxygen  Post-op Assessment: Post -op Vital signs reviewed and stable  Post vital signs: stable  Last Vitals:  Vitals:   02/23/17 0551 02/23/17 0845  BP: 129/86 (P) 111/76  Pulse: 69   Resp: 18 (P) 14  Temp: 36.9 C (!) (P) 36.4 C  SpO2: 97%     Last Pain:  Vitals:   02/23/17 0551  TempSrc: Oral      Patients Stated Pain Goal: 2 (21/22/48 2500)  Complications: No apparent anesthesia complications

## 2017-02-23 NOTE — Op Note (Signed)
02/23/2017  8:26 AM  PATIENT:  Keith Bullock  57 y.o. male  PRE-OPERATIVE DIAGNOSIS:  Incarcerated umbilical hernia  POST-OPERATIVE DIAGNOSIS: incarcerated umbilical hernia  PROCEDURE:  Procedure(s): LAPAROSCOPIC UMBILICAL HERNIA REPAIR WITH MESH (N/A) INSERTION OF MESH (N/A)  SURGEON:  Surgeon(s) and Role:    * Ralene Ok, MD - Primary  ASSISTANTS: Algis Greenhouse, RNFA   ANESTHESIA:   local and general  EBL:  5cc   BLOOD ADMINISTERED:none  DRAINS: none   LOCAL MEDICATIONS USED:  BUPIVICAINE   SPECIMEN:  No Specimen  DISPOSITION OF SPECIMEN:  N/A  COUNTS:  YES  TOURNIQUET:  * No tourniquets in log *  DICTATION: .Dragon Dictation  Details of the procedure:   After the patient was consented patient was taken back to the operating room patient was then placed in supine position bilateral SCDs in place.  The patient was prepped and draped in the usual sterile fashion. After antibiotics were confirmed a timeout was called and all facts were verified. The Veress needle technique was used to insuflate the abdomen at Palmer's point. The abdomen was insufflated to 14 mm mercury. Subsequently a 5 mm trocar was placed a camera inserted there was no injury to any intra-abdominal organs.    There was seen to be an incarcerated  umbilical hernia.  A second camera port was in placed into the left lower quadrant.   At this the Falicform ligament was taken down with Bovie cautery maintaining hemostasis.   I proceeded to reduce the incarcerated hernia contents.  Once the hernia was cleared away, a Bard Ventralight 15.2cm  mesh was inserted into the abdomen.  The mesh was secured circumferentially with am Securestrap tacker in a double crown fashion.  2-0 prolenes were used at 3:00, 6:00, 9:00, and 12:00 as transfascial sutures using a endoclose decive.    The omentum was brought over the area of the mesh. The pneumoperitoneum was evacuated  & all trocars  were removed. The skin was  reapproximated with 4-0  Monocryl sutures in a subcuticular fashion. The skin was dressed with Steri-Strips tape and gauze.  The patient was taken to the recovery room in stable condition.   PLAN OF CARE: Discharge to home after PACU  PATIENT DISPOSITION:  PACU - hemodynamically stable.   Delay start of Pharmacological VTE agent (>24hrs) due to surgical blood loss or risk of bleeding: not applicable

## 2017-02-23 NOTE — Anesthesia Preprocedure Evaluation (Addendum)
Anesthesia Evaluation  Patient identified by MRN, date of birth, ID band Patient awake    Reviewed: Allergy & Precautions, NPO status , Patient's Chart, lab work & pertinent test results  Airway Mallampati: II  TM Distance: >3 FB Neck ROM: Full    Dental no notable dental hx. (+) Dental Advisory Given, Teeth Intact   Pulmonary neg pulmonary ROS,    Pulmonary exam normal breath sounds clear to auscultation       Cardiovascular hypertension, Pt. on medications negative cardio ROS Normal cardiovascular exam Rhythm:Regular Rate:Normal     Neuro/Psych negative neurological ROS  negative psych ROS   GI/Hepatic negative GI ROS, Neg liver ROS,   Endo/Other  negative endocrine ROS  Renal/GU negative Renal ROS  negative genitourinary   Musculoskeletal negative musculoskeletal ROS (+)   Abdominal   Peds negative pediatric ROS (+)  Hematology negative hematology ROS (+)   Anesthesia Other Findings   Reproductive/Obstetrics negative OB ROS                            Anesthesia Physical Anesthesia Plan  ASA: II  Anesthesia Plan: General   Post-op Pain Management:    Induction: Intravenous  PONV Risk Score and Plan: 2 and Ondansetron and Midazolam  Airway Management Planned: Oral ETT  Additional Equipment:   Intra-op Plan:   Post-operative Plan: Extubation in OR  Informed Consent: I have reviewed the patients History and Physical, chart, labs and discussed the procedure including the risks, benefits and alternatives for the proposed anesthesia with the patient or authorized representative who has indicated his/her understanding and acceptance.   Dental advisory given  Plan Discussed with: CRNA  Anesthesia Plan Comments:         Anesthesia Quick Evaluation

## 2017-02-23 NOTE — Discharge Instructions (Signed)
[facility] _______[facility] ° °HERNIA REPAIR: POST OP INSTRUCTIONS ° °Always review your discharge instruction sheet given to you by the facility where your surgery was performed. °IF YOU HAVE DISABILITY OR FAMILY LEAVE FORMS, YOU MUST BRING THEM TO THE OFFICE FOR PROCESSING.   °DO NOT GIVE THEM TO YOUR DOCTOR. ° °1. A  prescription for pain medication may be given to you upon discharge.  Take your pain medication as prescribed, if needed.  If narcotic pain medicine is not needed, then you may take acetaminophen (Tylenol) or ibuprofen (Advil) as needed. °2. Take your usually prescribed medications unless otherwise directed. °If you need a refill on your pain medication, please contact your pharmacy.  They will contact our office to request authorization. Prescriptions will not be filled after 5 pm or on week-ends. °3. You should follow a light diet the first 24 hours after arrival home, such as soup and crackers, etc.  Be sure to include lots of fluids daily.  Resume your normal diet the day after surgery. °4.Most patients will experience some swelling and bruising around the umbilicus or in the groin and scrotum.  Ice packs and reclining will help.  Swelling and bruising can take several days to resolve.  °6. It is common to experience some constipation if taking pain medication after surgery.  Increasing fluid intake and taking a stool softener (such as Colace) will usually help or prevent this problem from occurring.  A mild laxative (Milk of Magnesia or Miralax) should be taken according to package directions if there are no bowel movements after 48 hours. °7. Unless discharge instructions indicate otherwise, you may remove your bandages 24-48 hours after surgery, and you may shower at that time.  You may have steri-strips (small skin tapes) in place directly over the incision.  These strips should be left on the skin for 7-10 days.  If your surgeon used skin glue on the incision, you may shower in  24 hours.  The glue will flake off over the next 2-3 weeks.  Any sutures or staples will be removed at the office during your follow-up visit. °8. ACTIVITIES:  You may resume regular (light) daily activities beginning the next day--such as daily self-care, walking, climbing stairs--gradually increasing activities as tolerated.  You may have sexual intercourse when it is comfortable.  Refrain from any heavy lifting or straining until approved by your doctor. ° °a.You may drive when you are no longer taking prescription pain medication, you can comfortably wear a seatbelt, and you can safely maneuver your car and apply brakes. °b.RETURN TO WORK:   °_____________________________________________ ° °9.You should see your doctor in the office for a follow-up appointment approximately 2-3 weeks after your surgery.  Make sure that you call for this appointment within a day or two after you arrive home to insure a convenient appointment time. °10.OTHER INSTRUCTIONS: _________________________ °   _____________________________________ ° °WHEN TO CALL YOUR DOCTOR: °1. Fever over 101.0 °2. Inability to urinate °3. Nausea and/or vomiting °4. Extreme swelling or bruising °5. Continued bleeding from incision. °6. Increased pain, redness, or drainage from the incision ° °The clinic staff is available to answer your questions during regular business hours.  Please don’t hesitate to call and ask to speak to one of the nurses for clinical concerns.  If you have a medical emergency, go to the nearest emergency room or call 911.  A surgeon from [facility] is always on call at the hospital ° ° °[address], [address], [place], [place]  [address] ? ° [address] [address], [place], [place]   [address] °[phone] ? [phone] ? FAX [phone] °Web site: [email or web address] ° °

## 2017-02-24 ENCOUNTER — Encounter (HOSPITAL_COMMUNITY): Payer: Self-pay | Admitting: General Surgery

## 2017-03-09 ENCOUNTER — Ambulatory Visit (INDEPENDENT_AMBULATORY_CARE_PROVIDER_SITE_OTHER): Payer: BLUE CROSS/BLUE SHIELD | Admitting: Podiatry

## 2017-03-09 DIAGNOSIS — M7752 Other enthesopathy of left foot: Secondary | ICD-10-CM | POA: Diagnosis not present

## 2017-03-09 NOTE — Patient Instructions (Signed)

## 2017-03-10 DIAGNOSIS — L709 Acne, unspecified: Secondary | ICD-10-CM | POA: Diagnosis not present

## 2017-03-10 NOTE — Progress Notes (Signed)
Subjective: Keith Bullock presents the office today for follow-up evaluation versus a left foot.  States that it has gotten better since his last appointment he is also had a hernia surgery she has not been on his feet as much.  He also feels that the steroid injection did decrease the size of the mass.  He denies any swelling or redness otherwise.  He has no other concerns. Denies any systemic complaints such as fevers, chills, nausea, vomiting. No acute changes since last appointment, and no other complaints at this time.   Objective: AAO x3, NAD DP/PT pulses palpable bilaterally, CRT less than 3 seconds Fluid-filled mass present along the submetatarsal one area plantar to the sesamoids.  It appears to be smaller compared to last appointment is mild tenderness palpation.  No erythema or increase in warmth.  There is no open sore identified.  No clinical signs of infection. No open lesions or pre-ulcerative lesions.  No pain with calf compression, swelling, warmth, erythema  Assessment: Bursa left foot  Plan: -All treatment options discussed with the patient including all alternatives, risks, complications.  -Discussed treatment options.  Discussed another steroid injection and wishes to proceed with this.  See procedure note below. -Orthotics were dispensed today.  Oral and written break instructions were discussed. -Follow-up in 4 weeks or sooner if needed.  Call any questions or concerns. -Patient encouraged to call the office with any questions, concerns, change in symptoms.    Procedure: Injection bursa Discussed alternatives, risks, complications and verbal consent was obtained.  Location: Left submetatarsal 1  Skin Prep: Alcohol. Injectate: 1 cc 0.5% marcaine plain, 1 cc 0.5% Marcaine plain and, 1 cc kenalog 10. Disposition: Patient tolerated procedure well. Injection site dressed with a band-aid.  Post-injection care was discussed and return precautions discussed.   Trula Slade  DPM

## 2017-03-16 ENCOUNTER — Other Ambulatory Visit: Payer: BLUE CROSS/BLUE SHIELD | Admitting: Orthotics

## 2017-03-22 DIAGNOSIS — H2513 Age-related nuclear cataract, bilateral: Secondary | ICD-10-CM | POA: Diagnosis not present

## 2017-04-06 ENCOUNTER — Ambulatory Visit (INDEPENDENT_AMBULATORY_CARE_PROVIDER_SITE_OTHER): Payer: BLUE CROSS/BLUE SHIELD | Admitting: Podiatry

## 2017-04-06 DIAGNOSIS — M7752 Other enthesopathy of left foot: Secondary | ICD-10-CM

## 2017-04-06 NOTE — Progress Notes (Signed)
Subjective: Keith Bullock presents the office today for follow-up evaluation of the lower bursa of the left foot.  He states he is doing very well and he states there has not been any fluid in the area.  He says the orthotics have been helping quite a bit as well.  He has no pain.  He is very happy at this point.  He has no other concerns. Denies any systemic complaints such as fevers, chills, nausea, vomiting. No acute changes since last appointment, and no other complaints at this time.   Objective: AAO x3, NAD DP/PT pulses palpable bilaterally, CRT less than 3 seconds On the left foot submetatarsal one plate of the sesamoids there is very minimal fluid present in this area but there is no erythema or increase in warmth.  There is no tenderness palpation.  Is no pain with MPJ range of motion.  No area of tenderness identified bilaterally.   No open lesions or pre-ulcerative lesions.  No pain with calf compression, swelling, warmth, erythema  Assessment: Resolved bursitis left foot  Plan: -All treatment options discussed with the patient including all alternatives, risks, complications.  -At this point he is having no pain there is very minimal fluid.  Because of this we held off another injection today he would like to hold off on one as well.  Discussed in the future if he has recurrence of symptoms we can do this.  Continue with orthotics which appear to be fitting well. He is interested in a second pair we discussed cost and he wants to get a second pair to call the office and we can order this for him.  He like 1 of the same style to get in the repair. -Patient encouraged to call the office with any questions, concerns, change in symptoms.   Trula Slade DPM

## 2017-04-09 DIAGNOSIS — L700 Acne vulgaris: Secondary | ICD-10-CM | POA: Diagnosis not present

## 2017-04-13 ENCOUNTER — Encounter: Payer: Self-pay | Admitting: Family Medicine

## 2017-04-13 ENCOUNTER — Ambulatory Visit (INDEPENDENT_AMBULATORY_CARE_PROVIDER_SITE_OTHER): Payer: BLUE CROSS/BLUE SHIELD | Admitting: Family Medicine

## 2017-04-13 VITALS — BP 136/82 | HR 72 | Ht 70.0 in | Wt 214.2 lb

## 2017-04-13 DIAGNOSIS — I10 Essential (primary) hypertension: Secondary | ICD-10-CM

## 2017-04-13 DIAGNOSIS — R7989 Other specified abnormal findings of blood chemistry: Secondary | ICD-10-CM

## 2017-04-13 DIAGNOSIS — E669 Obesity, unspecified: Secondary | ICD-10-CM | POA: Diagnosis not present

## 2017-04-13 DIAGNOSIS — E782 Mixed hyperlipidemia: Secondary | ICD-10-CM

## 2017-04-13 DIAGNOSIS — E559 Vitamin D deficiency, unspecified: Secondary | ICD-10-CM

## 2017-04-13 DIAGNOSIS — R945 Abnormal results of liver function studies: Secondary | ICD-10-CM

## 2017-04-13 DIAGNOSIS — E781 Pure hyperglyceridemia: Secondary | ICD-10-CM

## 2017-04-13 DIAGNOSIS — R7303 Prediabetes: Secondary | ICD-10-CM

## 2017-04-13 MED ORDER — OMEGA-3-ACID ETHYL ESTERS 1 G PO CAPS: 2.0000 g | ORAL_CAPSULE | Freq: Two times a day (BID) | ORAL | 3 refills | Status: DC

## 2017-04-13 NOTE — Patient Instructions (Addendum)
Risk factors for prediabetes and type 2 diabetes ° °Researchers don't fully understand why some people develop prediabetes and type 2 diabetes and others don't.  It's clear that certain factors increase the risk, however, including: ° °Weight. The more fatty tissue you have, the more resistant your cells become to insulin.  °Inactivity. The less active you are, the greater your risk. Physical activity helps you control your weight, uses up glucose as energy and makes your cells more sensitive to insulin.  °Family history. Your risk increases if a parent or sibling has type 2 diabetes.  °Race. Although it's unclear why, people of certain races -- including blacks, Hispanics, American Indians and Asian-Americans -- are at higher risk.  °Age. Your risk increases as you get older. This may be because you tend to exercise less, lose muscle mass and gain weight as you age. But type 2 diabetes is also increasing dramatically among children, adolescents and younger adults.  °Gestational diabetes. If you developed gestational diabetes when you were pregnant, your risk of developing prediabetes and type 2 diabetes later increases. If you gave birth to a baby weighing more than 9 pounds (4 kilograms), you're also at risk of type 2 diabetes.  °Polycystic ovary syndrome. For women, having polycystic ovary syndrome -- a common condition characterized by irregular menstrual periods, excess hair growth and obesity -- increases the risk of diabetes.  °High blood pressure. Having blood pressure over 140/90 millimeters of mercury (mm Hg) is linked to an increased risk of type 2 diabetes.  °Abnormal cholesterol and triglyceride levels. If you have low levels of high-density lipoprotein (HDL), or "good," cholesterol, your risk of type 2 diabetes is higher. Triglycerides are another type of fat carried in the blood. People with high levels of triglycerides have an increased risk of type 2 diabetes. Your doctor can let you know what your  cholesterol and triglyceride levels are. ° °A good guide to good carbs: The glycemic index °---If you have diabetes, or at risk for diabetes, you know all too well that when you eat carbohydrates, your blood sugar goes up. The total amount of carbs you consume at a meal or in a snack mostly determines what your blood sugar will do. But the food itself also plays a role. A serving of white rice has almost the same effect as eating pure table sugar -- a quick, high spike in blood sugar. A serving of lentils has a slower, smaller effect. ° °---Picking good sources of carbs can help you control your blood sugar and your weight. Even if you don't have diabetes, eating healthier carbohydrate-rich foods can help ward off a host of chronic conditions, from heart disease to various cancers to, well, diabetes. ° °---One way to choose foods is with the glycemic index (GI). This tool measures how much a food boosts blood sugar.  The glycemic index rates the effect of a specific amount of a food on blood sugar compared with the same amount of pure glucose. A food with a glycemic index of 28 boosts blood sugar only 28% as much as pure glucose. One with a GI of 95 acts like pure glucose. ° ° ° °High glycemic foods result in a quick spike in insulin and blood sugar (also known as blood glucose).  Low glycemic foods have a slower, smaller effect- these are healthier for you.  ° °Using the glycemic index °Using the glycemic index is easy: choose foods in the low GI category instead of those in the high   GI category (see below), and go easy on those in between. °Low glycemic index (GI of 55 or less): Most fruits and vegetables, beans, minimally processed grains, pasta, low-fat dairy foods, and nuts.  °Moderate glycemic index (GI 56 to 69): White and sweet potatoes, corn, white rice, couscous, breakfast cereals such as Cream of Wheat and Mini Wheats.  °High glycemic index (GI of 70 or higher): White bread, rice cakes, most crackers,  bagels, cakes, doughnuts, croissants, most packaged breakfast cereals. °You can see the values for 100 commons foods and get links to more at www.health.[employer].edu/glycemic. ° °Swaps for lowering glycemic index  °Instead of this high-glycemic index food Eat this lower-glycemic index food  °White rice Brown rice or converted rice  °Instant oatmeal Steel-cut oats  °Cornflakes Bran flakes  °Baked potato Pasta, bulgur  °White bread Whole-grain bread  °Corn Peas or leafy greens  ° ° ° ° ° °Prediabetes Eating Plan ° °Prediabetes--also called impaired glucose tolerance or impaired fasting glucose--is a condition that causes blood sugar (blood glucose) levels to be higher than normal. Following a healthy diet can help to keep prediabetes under control. It can also help to lower the risk of type 2 diabetes and heart disease, which are increased in people who have prediabetes. Along with regular exercise, a healthy diet: °· Promotes weight loss. °· Helps to control blood sugar levels. °· Helps to improve the way that the body uses insulin. ° ° °WHAT DO I NEED TO KNOW ABOUT THIS EATING PLAN? ° °· Use the glycemic index (GI) to plan your meals. The index tells you how quickly a food will raise your blood sugar. Choose low-GI foods. These foods take a longer time to raise blood sugar. °· Pay close attention to the amount of carbohydrates in the food that you eat. Carbohydrates increase blood sugar levels. °· Keep track of how many calories you take in. Eating the right amount of calories will help you to achieve a healthy weight. Losing about 7 percent of your starting weight can help to prevent type 2 diabetes. °· You may want to follow a Mediterranean diet. This diet includes a lot of vegetables, lean meats or fish, whole grains, fruits, and healthy oils and fats. ° ° °WHAT FOODS CAN I EAT? ° °Grains °Whole grains, such as whole-wheat or whole-grain breads, crackers, cereals, and pasta. Unsweetened oatmeal. Bulgur. Barley.  Quinoa. Brown rice. Corn or whole-wheat flour tortillas or taco shells. °Vegetables °Lettuce. Spinach. Peas. Beets. Cauliflower. Cabbage. Broccoli. Carrots. Tomatoes. Squash. Eggplant. Herbs. Peppers. Onions. Cucumbers. Brussels sprouts. °Fruits °Berries. Bananas. Apples. Oranges. Grapes. Papaya. Mango. Pomegranate. Kiwi. Grapefruit. Cherries. °Meats and Other Protein Sources °Seafood. Lean meats, such as chicken and turkey or lean cuts of pork and beef. Tofu. Eggs. Nuts. Beans. °Dairy °Low-fat or fat-free dairy products, such as yogurt, cottage cheese, and cheese. °Beverages °Water. Tea. Coffee. Sugar-free or diet soda. Seltzer water. Milk. Milk alternatives, such as soy or almond milk. °Condiments °Mustard. Relish. Low-fat, low-sugar ketchup. Low-fat, low-sugar barbecue sauce. Low-fat or fat-free mayonnaise. °Sweets and Desserts °Sugar-free or low-fat pudding. Sugar-free or low-fat ice cream and other frozen treats. °Fats and Oils °Avocado. Walnuts. Olive oil. °The items listed above may not be a complete list of recommended foods or beverages. Contact your dietitian for more options.  ° ° °WHAT FOODS ARE NOT RECOMMENDED? ° °Grains °Refined white flour and flour products, such as bread, pasta, snack foods, and cereals. °Beverages °Sweetened drinks, such as sweet iced tea and soda. °Sweets and Desserts °  Baked goods, such as cake, cupcakes, pastries, cookies, and cheesecake. °The items listed above may not be a complete list of foods and beverages to avoid. Contact your dietitian for more information. °  °This information is not intended to replace advice given to you by your health care provider. Make sure you discuss any questions you have with your health care provider. °  °Document Released: 05/22/2014 Document Reviewed: 05/22/2014 °[employer]vier Interactive Patient Education ©2016 [employer] ° ° ° °

## 2017-04-13 NOTE — Progress Notes (Signed)
Impression and Recommendations:    1. Essential hypertension   2. Elevated LFTs   3. Obesity, Class I, BMI 30-34.9   4. Mixed hyperlipidemia   5. Hypertriglyceridemia   6. Vitamin D deficiency   7. Prediabetes    1. HTN  Blood pressure re-checked by Dr. Raliegh Scarlet in office in patient's left arm: 136/82.  - Per patient, blood pressure is well controlled at home. - Reviewed that 130's over 70's is where we want it.  - Taking full tablet of amlodipine and full tablet of valsartan-HCTZ. - Patient advised to continue medicines as prescribed.  2. Hyperlipidemia - Continues taking his Zetia at night.  Will continue as prescribed.  - Prescription fish oil, Lovaza, prescribed today. - Patient advised to take 2 fish oil pills, twice daily.  3. General Health Maintenance Explained to patient what BMI refers to, and what it means medically.    Told patient to think about it as a "medical risk stratification measurement" and how increasing BMI is associated with increasing risk/ or worsening state of various diseases such as hypertension, hyperlipidemia, diabetes, premature OA, depression etc.  American Heart Association guidelines for healthy diet, basically Mediterranean diet, and exercise guidelines of 30 minutes 5 days per week or more discussed in detail.  Health counseling performed.  All questions answered.  - Advised to massage the areas of scars affected by his bellybutton surgery to help prevent adhesions.  - Advised patient to continue working toward exercising to improve health.    - Patient should be striving for at least 15 minutes of activity daily.  Recommended that the patient eventually strive for at least 150 minutes of cardio per week according to guidelines established by the [facility].   - Healthy dietary habits encouraged, including low-carb, and high amounts of lean protein in diet.   - Patient should also consume adequate amounts of water - half of body weight in  oz of water per day.  4. Follow-Up - Early May, he will return for a fasting lipid profile, CMP, A1c, and Vitamin D. - Follow-up appointment will be mid-May. - Patient would also like to get shingles vaccine - shingrix.   Education and routine counseling performed. Handouts provided.  Orders Placed This Encounter  Procedures  . Comprehensive metabolic panel  . Hemoglobin A1c  . Lipid panel  . VITAMIN D 25 Hydroxy (Vit-D Deficiency, Fractures)    Meds ordered this encounter  Medications  . omega-3 acid ethyl esters (LOVAZA) 1 g capsule    Sig: Take 2 capsules (2 g total) by mouth 2 (two) times daily.    Dispense:  360 capsule    Refill:  3    The patient was counseled, risk factors were discussed, anticipatory guidance given.  Gross side effects, risk and benefits, and alternatives of medications discussed with patient.  Patient is aware that all medications have potential side effects and we are unable to predict every side effect or drug-drug interaction that may occur.  Expresses verbal understanding and consents to current therapy plan and treatment regimen.  Return f/up HLD, HTN, Pre-DM, WT, for Early to mid May come in for fasting blood work then [place] with me 2-3d later.  Please see AVS handed out to patient at the end of our visit for further patient instructions/ counseling done pertaining to today's office visit.    Note: This document was prepared using Dragon voice recognition software and may include unintentional dictation errors.  This document serves as  a record of services personally performed by Mellody Dance, DO. It was created on her behalf by Toni Amend, a trained medical scribe. The creation of this record is based on the scribe's personal observations and the provider's statements to them.    I have reviewed the above medical documentation for accuracy and completeness and I concur.  Mellody Dance 04/13/17 10:05 AM    Subjective:    HPI:  Keith Bullock is a 57 y.o. male who presents to [place] [facility] today for follow up for HTN.    Last visit was January for a physical, and last follow-up was a couple months prior to that.  Had bellybutton surgery 7 weeks ago yesterday.  Notes that it is healing well. Denies GI issues or problems with recovery.  He has been taking fish oil - takes 3 pills per day.  He lost about 15 lbs recently, around the time of his surgery.  Was exercising often.  Completely cut out his drinking during that time.  Started "eating like a horse again," but notes that he is still 10 lbs down from where he was prior.   Notes that he currently drinks 3-4 alcoholic drinks per day.  Denies feelings of excessive thirst or excessive urination.  HTN:  - Blood pressure re-checked by Dr. Raliegh Scarlet in office in patient's left arm: 136/82.  - His blood pressure has been controlled at home.  Reports running normally around 125/70-80.  - Patient reports good compliance with blood pressure medications  - Denies medication S-E   - Smoking Status noted   - He denies new onset of: chest pain, exercise intolerance, shortness of breath, dizziness, visual changes, headache, lower extremity swelling or claudication.   Last 3 blood pressure readings in our office are as follows: BP Readings from Last 3 Encounters:  04/13/17 136/82  02/23/17 110/78  02/18/17 114/74    Pulse Readings from Last 3 Encounters:  04/13/17 72  02/23/17 66  02/18/17 63    Filed Weights   04/13/17 0855  Weight: 214 lb 3.2 oz (97.2 kg)     Patient Care Team    Relationship Specialty Notifications Start End  Mellody Dance, DO PCP - General Family Medicine  01/07/16   Wilford Corner, MD Consulting Physician Gastroenterology  01/07/16   Warren Danes, PA-C Physician Assistant Dermatology  01/07/16   Carolan Clines, MD Consulting Physician Urology  01/07/16    Comment: Jennette Dubin, MD Consulting Physician Infectious Diseases  01/07/16      Lab Results  Component Value Date   CREATININE 1.05 02/18/2017   BUN 18 02/18/2017   NA 137 02/18/2017   K 3.7 02/18/2017   CL 105 02/18/2017   CO2 23 02/18/2017    Lab Results  Component Value Date   CHOL 177 01/25/2017   CHOL 228 (A) 09/01/2016   CHOL 199 03/26/2016    Lab Results  Component Value Date   HDL 42 01/25/2017   HDL 79 (A) 09/01/2016   HDL 44 03/26/2016    Lab Results  Component Value Date   LDLCALC 99 01/25/2017   LDLCALC 29 09/01/2016   LDLCALC 127 (H) 03/26/2016    Lab Results  Component Value Date   TRIG 178 (H) 01/25/2017   TRIG 147 09/01/2016   TRIG 142 03/26/2016    Lab Results  Component Value Date   CHOLHDL 4.2 01/25/2017   CHOLHDL 4.5 03/26/2016   CHOLHDL  3.9 01/07/2016    No results found for: LDLDIRECT ===================================================================   Patient Active Problem List   Diagnosis Date Noted  . Hypertriglyceridemia 01/07/2016    Priority: High  . HLD (hyperlipidemia) 01/07/2016    Priority: High  . Essential hypertension 08/26/2015    Priority: High  . H/o back strain - M-SK and rad to sides/ front 01/07/2016    Priority: Medium  . Excessive drinking of alcohol 01/07/2016    Priority: Medium  . Obesity, Class I, BMI 30-34.9 01/07/2016    Priority: Medium  . h/o recent MRSA infection 08/26/2015    Priority: Medium  . Elevated PSA measurement 01/07/2016    Priority: Low  . Decreased libido 01/07/2016    Priority: Low  . ED (erectile dysfunction) 01/07/2016    Priority: Low  . Chronic periumbilical pain with hernia 12/14/2016  . Irreducible umbilical hernia 44/31/5400  . Callus of foot 12/14/2016  . Elevated fasting blood sugar- 08/2016 w derm labs 12/14/2016  . Elevated LFTs 08/12/2016  . Myalgia due to statin 01/07/2016     Past Medical History:  Diagnosis Date  . Hypertension   . MRSA cellulitis       Past Surgical History:  Procedure Laterality Date  . INSERTION OF MESH N/A 02/23/2017   Procedure: INSERTION OF MESH;  Surgeon: Ralene Ok, MD;  Location: [facility];  Service: General;  Laterality: N/A;  . ROTATOR CUFF REPAIR Left   . UMBILICAL HERNIA REPAIR N/A 02/23/2017   Procedure: LAPAROSCOPIC UMBILICAL HERNIA REPAIR WITH MESH;  Surgeon: Ralene Ok, MD;  Location: [facility] OR;  Service: General;  Laterality: N/A;     Family History  Problem Relation Age of Onset  . Cancer Mother        Ovarian  . Depression Mother   . Hypertension Mother   . Cancer Father        Colon  . Diabetes Paternal Grandfather      Social History   Substance and Sexual Activity  Drug Use No  ,  Social History   Substance and Sexual Activity  Alcohol Use Yes   Comment: 2-3 a day  ,  Social History   Tobacco Use  Smoking Status Never Smoker  Smokeless Tobacco Never Used  ,    Current Outpatient Medications on File Prior to Visit  Medication Sig Dispense Refill  . amLODipine (NORVASC) 10 MG tablet Take 1 tablet (10 mg total) by mouth daily. 90 tablet 1  . cetirizine (ZYRTEC) 10 MG tablet Take 10 mg by mouth daily as needed for allergies.    Marland Kitchen ezetimibe (ZETIA) 10 MG tablet Take 1 tablet (10 mg total) by mouth at bedtime. (Patient taking differently: Take 10 mg by mouth every evening. ) 90 tablet 1  . Famotidine-Ca Carb-Mag Hydrox (PEPCID COMPLETE PO) Take 1 tablet by mouth daily as needed (acid reflux).    . ISOtretinoin (CLARAVIS) 40 MG capsule Take 40 mg by mouth 2 (two) times daily.    . Multiple Vitamin (MULTIVITAMIN WITH MINERALS) TABS tablet Take 1 tablet by mouth daily.    . Naphazoline-Pheniramine (OPCON-A) 0.027-0.315 % SOLN Apply 1 drop to eye daily as needed (dry eyes).    . naproxen sodium (ANAPROX) 220 MG tablet Take 220 mg by mouth daily as needed (pain).     . sildenafil (REVATIO) 20 MG tablet Take 60 mg by mouth daily as needed (erectile dysfunction).    . traMADol  (ULTRAM) 50 MG tablet Take 1 tablet (50 mg total)  by mouth every 6 (six) hours as needed. 30 tablet 0  . valsartan-hydrochlorothiazide (DIOVAN-HCT) 320-25 MG tablet Take 1 tablet by mouth daily. 90 tablet 1   No current facility-administered medications on file prior to visit.      Allergies  Allergen Reactions  . Flomax [Tamsulosin Hcl] Anxiety    Review of Systems:   General:  Denies fever, chills Optho/Auditory:   Denies visual changes, blurred vision Respiratory:   Denies SOB, cough, wheeze, DIB  Cardiovascular:   Denies chest pain, palpitations, painful respirations Gastrointestinal:   Denies nausea, vomiting, diarrhea.  Endocrine:     Denies new hot or cold intolerance Musculoskeletal:  Denies joint swelling, gait issues, or new unexplained myalgias/ arthralgias Skin:  Denies rash, suspicious lesions  Neurological:    Denies dizziness, unexplained weakness, numbness  Psychiatric/Behavioral:   Denies mood changes  Objective:    Blood pressure 136/82, pulse 72, height 5\' 10"  (1.778 m), weight 214 lb 3.2 oz (97.2 kg), SpO2 97 %. Body mass index is 30.73 kg/m.  General: Well Developed, well nourished, and in no acute distress.  HEENT: Normocephalic, atraumatic, pupils equal round reactive to light, neck supple, No carotid bruits, no JVD Skin: Warm and dry, cap RF less 2 sec Cardiac: Regular rate and rhythm, S1, S2 WNL's, no murmurs rubs or gallops Respiratory: ECTA B/L, Not using accessory muscles, speaking in full sentences. NeuroM-Sk: Ambulates w/o assistance, moves ext * 4 w/o difficulty, sensation grossly intact.  Ext: scant edema b/l lower ext Psych: No HI/SI, judgement and insight good, Euthymic mood. Full Affect.

## 2017-05-11 DIAGNOSIS — L700 Acne vulgaris: Secondary | ICD-10-CM | POA: Diagnosis not present

## 2017-06-04 ENCOUNTER — Other Ambulatory Visit: Payer: BLUE CROSS/BLUE SHIELD

## 2017-06-04 DIAGNOSIS — E781 Pure hyperglyceridemia: Secondary | ICD-10-CM

## 2017-06-04 DIAGNOSIS — R7989 Other specified abnormal findings of blood chemistry: Secondary | ICD-10-CM

## 2017-06-04 DIAGNOSIS — E782 Mixed hyperlipidemia: Secondary | ICD-10-CM | POA: Diagnosis not present

## 2017-06-04 DIAGNOSIS — R945 Abnormal results of liver function studies: Secondary | ICD-10-CM

## 2017-06-04 DIAGNOSIS — R7303 Prediabetes: Secondary | ICD-10-CM | POA: Diagnosis not present

## 2017-06-04 DIAGNOSIS — E669 Obesity, unspecified: Secondary | ICD-10-CM

## 2017-06-04 DIAGNOSIS — E559 Vitamin D deficiency, unspecified: Secondary | ICD-10-CM

## 2017-06-05 LAB — COMPREHENSIVE METABOLIC PANEL
A/G RATIO: 1.6 (ref 1.2–2.2)
ALT: 78 IU/L — AB (ref 0–44)
AST: 73 IU/L — AB (ref 0–40)
Albumin: 4.3 g/dL (ref 3.5–5.5)
Alkaline Phosphatase: 93 IU/L (ref 39–117)
BUN/Creatinine Ratio: 16 (ref 9–20)
BUN: 19 mg/dL (ref 6–24)
Bilirubin Total: 0.5 mg/dL (ref 0.0–1.2)
CALCIUM: 9.7 mg/dL (ref 8.7–10.2)
CO2: 27 mmol/L (ref 20–29)
CREATININE: 1.21 mg/dL (ref 0.76–1.27)
Chloride: 97 mmol/L (ref 96–106)
GFR, EST AFRICAN AMERICAN: 77 mL/min/{1.73_m2} (ref >59)
GFR, EST NON AFRICAN AMERICAN: 67 mL/min/{1.73_m2} (ref >59)
Globulin, Total: 2.7 g/dL (ref 1.5–4.5)
Glucose: 127 mg/dL — ABNORMAL HIGH (ref 65–99)
POTASSIUM: 4 mmol/L (ref 3.5–5.2)
Sodium: 136 mmol/L (ref 134–144)
TOTAL PROTEIN: 7 g/dL (ref 6.0–8.5)

## 2017-06-05 LAB — LIPID PANEL
Chol/HDL Ratio: 4.6 ratio (ref 0.0–5.0)
Cholesterol, Total: 195 mg/dL (ref 100–199)
HDL: 42 mg/dL (ref >39)
LDL Calculated: 116 mg/dL — ABNORMAL HIGH (ref 0–99)
Triglycerides: 183 mg/dL — ABNORMAL HIGH (ref 0–149)
VLDL Cholesterol Cal: 37 mg/dL (ref 5–40)

## 2017-06-05 LAB — VITAMIN D 25 HYDROXY (VIT D DEFICIENCY, FRACTURES): Vit D, 25-Hydroxy: 115 ng/mL — ABNORMAL HIGH (ref 30.0–100.0)

## 2017-06-05 LAB — HEMOGLOBIN A1C
ESTIMATED AVERAGE GLUCOSE: 117 mg/dL
HEMOGLOBIN A1C: 5.7 % — AB (ref 4.8–5.6)

## 2017-06-08 ENCOUNTER — Ambulatory Visit (INDEPENDENT_AMBULATORY_CARE_PROVIDER_SITE_OTHER): Payer: BLUE CROSS/BLUE SHIELD | Admitting: Family Medicine

## 2017-06-08 ENCOUNTER — Encounter: Payer: Self-pay | Admitting: Family Medicine

## 2017-06-08 VITALS — BP 127/84 | HR 83 | Ht 70.0 in | Wt 208.9 lb

## 2017-06-08 DIAGNOSIS — I10 Essential (primary) hypertension: Secondary | ICD-10-CM | POA: Diagnosis not present

## 2017-06-08 DIAGNOSIS — Z23 Encounter for immunization: Secondary | ICD-10-CM | POA: Diagnosis not present

## 2017-06-08 DIAGNOSIS — R7302 Impaired glucose tolerance (oral): Secondary | ICD-10-CM

## 2017-06-08 DIAGNOSIS — Z79899 Other long term (current) drug therapy: Secondary | ICD-10-CM

## 2017-06-08 DIAGNOSIS — E559 Vitamin D deficiency, unspecified: Secondary | ICD-10-CM

## 2017-06-08 DIAGNOSIS — R7989 Other specified abnormal findings of blood chemistry: Secondary | ICD-10-CM

## 2017-06-08 DIAGNOSIS — R945 Abnormal results of liver function studies: Secondary | ICD-10-CM

## 2017-06-08 DIAGNOSIS — E781 Pure hyperglyceridemia: Secondary | ICD-10-CM | POA: Diagnosis not present

## 2017-06-08 DIAGNOSIS — E782 Mixed hyperlipidemia: Secondary | ICD-10-CM

## 2017-06-08 DIAGNOSIS — E669 Obesity, unspecified: Secondary | ICD-10-CM

## 2017-06-08 DIAGNOSIS — F1010 Alcohol abuse, uncomplicated: Secondary | ICD-10-CM

## 2017-06-08 NOTE — Progress Notes (Signed)
Assessment and plan:  1. Glucose intolerance (impaired glucose tolerance)   2. Hypertriglyceridemia   3. Mixed hyperlipidemia   4. Essential hypertension   5. Obesity, Class I, BMI 30-34.9   6. Excessive drinking of alcohol   7. Elevated LFTs   8. High risk medication use-  Accutane from Derm   9. Need for shingles vaccine   10. Vitamin D deficiency     1. Prediabetes -A1c 06-04-17 was 5.7, improved from prior 4 months ago where it was 5.9.  -Continue prudent diet/exercise. Reduce intake of sweets and simple sugars etc.  2. HLD/HTG -recent FLP shows elevated results despite being on meds. Believe this is related to his isotretinoin. -Reduce intake of alcohol.  -continue other meds as listed below.  3. HTN -BP well controlled in office today. Continue meds as listed below.  -Check your BP at home and keep a log. Bring this into next OV.   4. Obesity -weight is down 6 lbs since last OV 04-13-17. Continue losing weight. -AHA exercise and dietary guidelines discussed.   5. Elevated LFT/excessive alcohol intake -Cut back on your alcohol intake. Half all of your alcohol intake. -Drink adequate amounts of water, equal to half of your wt in oz per day.   6. High risk medication -on accutane from dermatology. Medications can cause increased LFTs and HTG/HLD abnormalities. Pt will discuss with derm how long he needs to be on this.   7. Need for shingles vaccine  -given today.  8. Vit D -reduce supplement from 5000 IU BID to QD.     - reck 78mo or so   Education and routine counseling performed. Handouts provided.  Orders Placed This Encounter  Procedures  . Varicella-zoster vaccine IM (Shingrix)     Return for Hypertension, chol, Pre-Dm, wt, vit D follow up every 4 mo.   Anticipatory guidance and routine counseling done re: condition, txmnt options and need for follow up. All questions of patient's  were answered.   Gross side effects, risk and benefits, and alternatives of medications discussed with patient.  Patient is aware that all medications have potential side effects and we are unable to predict every sideeffect or drug-drug interaction that may occur.  Expresses verbal understanding and consents to current therapy plan and treatment regiment.  Please see AVS handed out to patient at the end of our visit for additional patient instructions/ counseling done pertaining to today's office visit.  Note: This document was prepared using Dragon voice recognition software and may include unintentional dictation errors.  This document serves as a record of services personally performed by Mellody Dance, DO. It was created on her behalf by Mayer Masker, a trained medical scribe. The creation of this record is based on the scribe's personal observations and the provider's statements to them.   I have reviewed the above medical documentation for accuracy and completeness and I concur.  Mellody Dance 06/08/17 12:27 PM   ----------------------------------------------------------------------------------------------------------------------  Subjective:   CC:   Keith Bullock is a 57 y.o. male who presents to [place] [facility] today for review and discussion of recent bloodwork that was done and FUP of HLD, Pre-DM2, and HTN.  1. All recent blood work that we ordered was reviewed with patient today.  Patient was counseled on all abnormalities and we discussed dietary and lifestyle changes that could help those values (also medications when appropriate).  Extensive health counseling performed and all patient's  concerns/ questions were addressed.    HTN HPI:  -  His blood pressure has been controlled at home.  Pt is checking it at home.   - Patient reports good compliance with blood pressure medications  - Denies medication S-E   - Smoking Status noted   - He  denies new onset of: chest pain, exercise intolerance, shortness of breath, dizziness, visual changes, headache, lower extremity swelling or claudication.   Last 3 blood pressure readings in our office are as follows: BP Readings from Last 3 Encounters:  06/08/17 127/84  04/13/17 136/82  02/23/17 110/78    Filed Weights   06/08/17 1031  Weight: 208 lb 14.4 oz (94.8 kg)   Liver He has recently been drinking a little more alcohol than usual since last OV.   He drinks vodka. He reports he recently went on a golfing trip just prior to his blood work and drank a lot of alcohol at that time.  He did have a abdominal ultrasound several years back that which did show diffuse fatty liver infiltrate.  Vit D He takes 2 5000 IUs daily.    Cholesterol He is on zetia and lovaza. He complains of excessive burping since starting on the Lovaza last office visit. He states he is on an isotretinoin cream for a skin condition which has elevated his TGs/ chol terribly, which is a known side effect.   He is seeing [name], his PA-C in dermatology, for FUP of his skin condition later this week.     Pre-DM HPI:  -  He has been working on diet and exercise for diabetes.   Denies polyuria/polydipsia.  Denies hypo/ hyperglycemia symptoms  Last A1C in the office was:  Lab Results  Component Value Date   HGBA1C 5.7 (H) 06/04/2017   HGBA1C 5.9 (H) 01/25/2017   HGBA1C 5.6 01/07/2016    Lab Results  Component Value Date   LDLCALC 116 (H) 06/04/2017   CREATININE 1.21 06/04/2017    Wt Readings from Last 3 Encounters:  06/08/17 208 lb 14.4 oz (94.8 kg)  04/13/17 214 lb 3.2 oz (97.2 kg)  02/23/17 215 lb (97.5 kg)    BP Readings from Last 3 Encounters:  06/08/17 127/84  04/13/17 136/82  02/23/17 110/78         Wt Readings from Last 3 Encounters:  06/08/17 208 lb 14.4 oz (94.8 kg)  04/13/17 214 lb 3.2 oz (97.2 kg)  02/23/17 215 lb (97.5 kg)   BP Readings from Last 3 Encounters:    06/08/17 127/84  04/13/17 136/82  02/23/17 110/78   Pulse Readings from Last 3 Encounters:  06/08/17 83  04/13/17 72  02/23/17 66   BMI Readings from Last 3 Encounters:  06/08/17 29.97 kg/m  04/13/17 30.73 kg/m  02/23/17 30.85 kg/m     Patient Care Team    Relationship Specialty Notifications Start End  Mellody Dance, DO PCP - General Family Medicine  01/07/16   Wilford Corner, MD Consulting Physician Gastroenterology  01/07/16   Warren Danes, PA-C Physician Assistant Dermatology  01/07/16   Carolan Clines, MD Consulting Physician Urology  01/07/16    Comment: Jennette Dubin, MD Consulting Physician Infectious Diseases  01/07/16     Full medical history updated and reviewed in the office today  Patient Active Problem List   Diagnosis Date Noted  . Hypertriglyceridemia 01/07/2016    Priority: High  . HLD (hyperlipidemia) 01/07/2016    Priority: High  . Essential hypertension  08/26/2015    Priority: High  . H/o back strain - M-SK and rad to sides/ front 01/07/2016    Priority: Medium  . Excessive drinking of alcohol 01/07/2016    Priority: Medium  . Obesity, Class I, BMI 30-34.9 01/07/2016    Priority: Medium  . h/o recent MRSA infection 08/26/2015    Priority: Medium  . Elevated PSA measurement 01/07/2016    Priority: Low  . Decreased libido 01/07/2016    Priority: Low  . ED (erectile dysfunction) 01/07/2016    Priority: Low  . Glucose intolerance (impaired glucose tolerance) 06/08/2017  . High risk medication use-  Accutane from [facility] [place] 06/08/2017  . Vitamin D deficiency 06/08/2017  . Chronic periumbilical pain with hernia 12/14/2016  . Irreducible umbilical hernia 74/16/3845  . Callus of foot 12/14/2016  . Elevated fasting blood sugar- 08/2016 w derm labs 12/14/2016  . Elevated LFTs 08/12/2016  . Myalgia due to statin 01/07/2016    Past Medical History:  Diagnosis Date  . Hypertension   . MRSA cellulitis     Past  Surgical History:  Procedure Laterality Date  . INSERTION OF MESH N/A 02/23/2017   Procedure: INSERTION OF MESH;  Surgeon: Ralene Ok, MD;  Location: [facility];  Service: General;  Laterality: N/A;  . ROTATOR CUFF REPAIR Left   . UMBILICAL HERNIA REPAIR N/A 02/23/2017   Procedure: LAPAROSCOPIC UMBILICAL HERNIA REPAIR WITH MESH;  Surgeon: Ralene Ok, MD;  Location: [facility];  Service: General;  Laterality: N/A;    Social History   Tobacco Use  . Smoking status: Never Smoker  . Smokeless tobacco: Never Used  Substance Use Topics  . Alcohol use: Yes    Comment: 2-3 a day    Family Hx: Family History  Problem Relation Age of Onset  . Cancer Mother        Ovarian  . Depression Mother   . Hypertension Mother   . Cancer Father        Colon  . Diabetes Paternal Grandfather      Medications: Current Outpatient Medications  Medication Sig Dispense Refill  . amLODipine (NORVASC) 10 MG tablet Take 1 tablet (10 mg total) by mouth daily. 90 tablet 1  . Biotin 5000 MCG TABS Take 1 tablet by mouth daily.    . cetirizine (ZYRTEC) 10 MG tablet Take 10 mg by mouth daily as needed for allergies.    . Cholecalciferol (D-3-5) 5000 units capsule Take 5,000 Units by mouth daily.    . Cyanocobalamin (B-12) 500 MCG TABS Take 1 tablet by mouth daily.    Marland Kitchen ezetimibe (ZETIA) 10 MG tablet Take 1 tablet (10 mg total) by mouth at bedtime. (Patient taking differently: Take 10 mg by mouth every evening. ) 90 tablet 1  . Famotidine-Ca Carb-Mag Hydrox (PEPCID COMPLETE PO) Take 1 tablet by mouth daily as needed (acid reflux).    . ISOtretinoin (CLARAVIS) 40 MG capsule Take 40 mg by mouth 2 (two) times daily.    . Multiple Vitamin (MULTIVITAMIN WITH MINERALS) TABS tablet Take 1 tablet by mouth daily.    . Naphazoline-Pheniramine (OPCON-A) 0.027-0.315 % SOLN Apply 1 drop to eye daily as needed (dry eyes).    . naproxen sodium (ANAPROX) 220 MG tablet Take 220 mg by mouth daily as needed (pain).     Marland Kitchen omega-3  acid ethyl esters (LOVAZA) 1 g capsule Take 2 capsules (2 g total) by mouth 2 (two) times daily. 360 capsule 3  . sildenafil (REVATIO) 20 MG tablet Take  60 mg by mouth daily as needed (erectile dysfunction).    . traMADol (ULTRAM) 50 MG tablet Take 1 tablet (50 mg total) by mouth every 6 (six) hours as needed. 30 tablet 0  . valsartan-hydrochlorothiazide (DIOVAN-HCT) 320-25 MG tablet Take 1 tablet by mouth daily. 90 tablet 1   No current facility-administered medications for this visit.     Allergies:  Allergies  Allergen Reactions  . Flomax [Tamsulosin Hcl] Anxiety     Review of Systems: General:   No F/C, wt loss Pulm:   No DIB, SOB, pleuritic chest pain Card:  No CP, palpitations Abd:  No n/v/d or pain Ext:  No inc edema from baseline  Objective:  Blood pressure 127/84, pulse 83, height 5\' 10"  (1.778 m), weight 208 lb 14.4 oz (94.8 kg), SpO2 98 %. Body mass index is 29.97 kg/m. Gen:   Well NAD, A and O *3 HEENT:    [place]/AT, EOMI,  MMM Lungs:   Normal work of breathing. CTA B/L, no Wh, rhonchi Heart:   RRR, S1, S2 WNL's, no MRG Abd:   No gross distention Exts:    warm, pink,  Brisk capillary refill, warm and well perfused.  Psych:    No HI/SI, judgement and insight good, Euthymic mood. Full Affect.   Recent Results (from the past 2160 hour(s))  VITAMIN D 25 Hydroxy (Vit-D Deficiency, Fractures)     Status: Abnormal   Collection Time: 06/04/17  9:03 AM  Result Value Ref Range   Vit D, 25-Hydroxy 115.0 (H) 30.0 - 100.0 ng/mL    Comment: Vitamin D deficiency has been defined by the [place] practice guideline as a level of serum 25-OH vitamin D less than 20 ng/mL (1,2). The Endocrine Society went on to further define vitamin D insufficiency as a level between 21 and 29 ng/mL (2). 1. IOM (Institute of Medicine). 2010. Dietary reference    intakes for calcium and D. [place]: The    [employer]. 2. Holick MF, Binkley  Shafter, Bischoff-Ferrari HA, et al.    Evaluation, treatment, and prevention of vitamin D    deficiency: an Endocrine Society clinical practice    guideline. JCEM. 2011 Jul; 96(7):1911-30.   Lipid panel     Status: Abnormal   Collection Time: 06/04/17  9:03 AM  Result Value Ref Range   Cholesterol, Total 195 100 - 199 mg/dL   Triglycerides 183 (H) 0 - 149 mg/dL   HDL 42 >39 mg/dL   VLDL Cholesterol Cal 37 5 - 40 mg/dL   LDL Calculated 116 (H) 0 - 99 mg/dL   Chol/HDL Ratio 4.6 0.0 - 5.0 ratio    Comment:                                   T. Chol/HDL Ratio                                             Men  Women                               1/2 Avg.Risk  3.4    3.3  Avg.Risk  5.0    4.4                                2X Avg.Risk  9.6    7.1                                3X Avg.Risk 23.4   11.0   Hemoglobin A1c     Status: Abnormal   Collection Time: 06/04/17  9:03 AM  Result Value Ref Range   Hgb A1c MFr Bld 5.7 (H) 4.8 - 5.6 %    Comment:          Prediabetes: 5.7 - 6.4          Diabetes: >6.4          Glycemic control for adults with diabetes: <7.0    Est. average glucose Bld gHb Est-mCnc 117 mg/dL  Comprehensive metabolic panel     Status: Abnormal   Collection Time: 06/04/17  9:03 AM  Result Value Ref Range   Glucose 127 (H) 65 - 99 mg/dL   BUN 19 6 - 24 mg/dL   Creatinine, Ser 1.21 0.76 - 1.27 mg/dL   GFR calc non Af Amer 67 >59 mL/min/1.73   GFR calc Af Amer 77 >59 mL/min/1.73   BUN/Creatinine Ratio 16 9 - 20   Sodium 136 134 - 144 mmol/L   Potassium 4.0 3.5 - 5.2 mmol/L   Chloride 97 96 - 106 mmol/L   CO2 27 20 - 29 mmol/L   Calcium 9.7 8.7 - 10.2 mg/dL   Total Protein 7.0 6.0 - 8.5 g/dL   Albumin 4.3 3.5 - 5.5 g/dL   Globulin, Total 2.7 1.5 - 4.5 g/dL   Albumin/Globulin Ratio 1.6 1.2 - 2.2   Bilirubin Total 0.5 0.0 - 1.2 mg/dL   Alkaline Phosphatase 93 39 - 117 IU/L   AST 73 (H) 0 - 40 IU/L   ALT 78 (H) 0 - 44 IU/L

## 2017-06-08 NOTE — Patient Instructions (Signed)
The quick and dirty--> lower triglyceride levels more through...  1) - Beware of bad fats: Cutting back on saturated fat (in red meat and full-fat dairy foods) and trans fats (in restaurant fried foods and commercially prepared baked goods) can lower triglycerides.  2) - Go for good carbs: Easily digested carbohydrates (such as white bread, white rice, cornflakes, and sugary sodas) give triglycerides a definite boost.   3) - Eating whole grains and cutting back on soda can help control triglycerides.  4) - Check your alcohol use. In some people, alcohol dramatically boosts triglycerides. The only way to know if this is true for you is to avoid alcohol for a few weeks and have your triglycerides tested again.  5) - Go fish. Omega-3 fats in salmon, tuna, sardines, and other fatty fish can lower triglycerides. Having fish twice a week is fine.  6) - Aim for a healthy weight. If you are overweight, losing just 5% to 10% of your weight can help drive down triglycerides.  7) - Get moving. Exercise lowers triglycerides and boosts heart-healthy HDL cholesterol.  8) - quit smoking if you do  --> for more information, see below; or go to  [email or web address]  and do a search for desired topics   For those diagnosed with high triglycerides, it's important to take action to lower your levels and improve your heart health.  Triglyceride is just a fancy word for fat - the fat in our bodies is stored in the form of triglycerides. Triglycerides are found in foods and manufactured in our bodies.  Normal triglyceride levels are defined as less than 150 mg/dL; 150 to 199 is considered borderline high; 200 to 499 is high; and 500 or higher is officially called very high. To me, anything over 150 is a red flag indicating my patient needs to take immediate steps to get the situation under control.   What is the significance of high triglycerides? High triglyceride levels make blood thicker and stickier, which  means that it is more likely to form clots. Studies have shown that triglyceride levels are associated with increased risks of cardiovascular disease and stroke - in both men and women - alone or in combination with other risk factors (high triglycerides combined with high LDL cholesterol can be a particularly deadly combination). For example, in one ground-breaking study, high triglycerides alone increased the risk of cardiovascular disease by 14 percent in men, and by 34 percent in women. But when the test subjects also had low HDL cholesterol (that's the good cholesterol) and other risk factors, high triglycerides increased the risk of disease by 32 percent in men and 76 percent in women.   Fortunately, triglycerides can sometimes be controlled with several diet and lifestyle changes.    What Factors Can Increase Triglycerides? As with cholesterol, eating too much of the wrong kinds of fats will raise your blood triglycerides.  Therefore, it's important to restrict the amounts of saturated fats and trans fats you allow into your diet.  Triglyceride levels can also shoot up after eating foods that are high in carbohydrates or after drinking alcohol.  That's why triglyceride blood tests require an overnight fast.  If you have elevated triglycerides, it's especially important to avoid sugary and refined carbohydrates, including sugar, honey, and other sweeteners, soda and other sugary drinks, candy, baked goods, and anything made with white (refined or enriched) flour, including white bread, rolls, cereals, buns, pastries, regular pasta, and white rice.  You'll also want to limit  dried fruit and fruit juice since they're dense in simple sugar.  All of these low-quality carbs cause a sudden rise in insulin, which may lead to a spike in triglycerides.  Triglycerides can also become elevated as a reaction to having diabetes, hypothyroidism, or kidney disease. As with most other heart-related factors, being  overweight and inactive also contribute to abnormal triglycerides. And unfortunately, some people have a genetic predisposition that causes them to manufacture way too much triglycerides on their own, no matter how carefully they eat.     How Can You Lower Your Triglyceride Levels? If you are diagnosed with high triglycerides, it's important to take action. There are several things you can do to help lower your triglyceride levels and improve your heart health:  --> Lose weight if you are overweight.  There is a clear correlation between obesity and high triglycerides - the heavier people are, the higher their triglyceride levels are likely to be. The good news is that losing weight can significantly lower triglycerides. In a large study of individuals with type 2 diabetes, those assigned to the "lifestyle intervention group" - which involved counseling, a low-calorie meal plan, and customized exercise program - lost 8.6% of their body weight and lowered their triglyceride levels by more than 16%. If you're overweight, find a weight loss plan that works for you and commit to shedding the pounds and getting healthier.  --> Reduce the amount of saturated fat and trans fat in your diet.  Start by avoiding or dramatically limiting butter, cream cheese, lard, sour cream, doughnuts, cakes, cookies, candy bars, regular ice cream, fried foods, pizza, cheese sauce, cream-based sauces and salad dressings, high-fat meats (including fatty hamburgers, bologna, pepperoni, sausage, bacon, salami, pastrami, spareribs, and hot dogs), high-fat cuts of beef and pork, and whole-milk dairy products.   Other ways to cut back: Choose lean meats only (including skinless chicken and [place], lean beef, lean pork), fish, and reduced-fat or fat-free dairy products.   Experiment with adding whole soy foods to your diet. Although soy itself may not reduce risk of heart disease, it replaces hazardous animal fats with healthier  proteins. Choose high-quality soy foods, such as tofu, tempeh, soy milk, and edamame (whole soybeans).  Always remove skin from poultry.  Prepare foods by baking, roasting, broiling, boiling, poaching, steaming, grilling, or stir-frying in vegetable oil.  Most stick margarines contain trans fats, and trans fats are also found in some packaged baked goods, potato chips, snack foods, fried foods, and fast food that use or create hydrogenated oils.    (All food labels must now list the amount of trans fats, right after the amount of saturated fats - good news for consumers. As a result, many food companies have now reformulated their products to be trans fat free.many, but not all! So it's still just as important to read labels and make sure the packaged foods you buy don't contain trans fats.)     If you use margarine, purchase soft-tub margarine spreads that contain 0 grams trans fats and don't list any partially hydrogenated oils in the ingredients list. By substituting olive oil or vegetable oil for trans fats in just 2 percent of your daily calories, you can reduce your risk of heart disease by 53 percent.   There is no safe amount of trans fats, so try to keep them as far from your plate as possible.  -->  Avoid foods that are concentrated in sugar (even dried fruit and fruit juice). Sugary foods  can elevate triglyceride levels in the blood, so keep them to a bare minimum.  --> Swap out refined carbohydrates for whole grains.  Refined carbohydrates - like white rice, regular pasta, and anything made with white or "enriched" flour (including white bread, rolls, cereals, buns, and crackers) - raise blood sugar and insulin levels more than fiber-rich whole grains. Higher insulin levels, in turn, can lead to a higher rise in triglycerides after a meal. So, make the switch to whole wheat bread, whole grain pasta, brown or wild rice, and whole grain versions of cereals, crackers, and other bread products.  However, it's important to know that individuals with high triglycerides should moderate even their intake of high-quality starches (since all starches raise blood sugar) - I recommend 1 to 2 servings per meal.  --> Cut way back on alcohol.  If you have high triglycerides, alcohol should be considered a rare treat - if you indulge at all, since even small amounts of alcohol can dramatically increase triglyceride levels.  --> Incorporate omega-3 fats.  Heart-healthy fish oils are especially rich in omega-3 fatty acids. In multiple studies over the past two decades, people who ate diets high in omega-3s had 30 to 40 percent reductions in heart disease. Although we don't yet know why fish oil works so well, there are several possibilities. Omega-3s seem to reduce inflammation, reduce high blood pressure, decrease triglycerides, raise HDL cholesterol, and make blood thinner and less sticky so it is less likely to clot. It's as close to a food prescription for heart health as it gets. If you have high triglycerides, I recommend eating at least three servings of one of the omega-3-rich fish every week (fatty fish is the most concentrated food form of omega three fats). If you cannot manage to eat that much fish, speak with your physician about taking fish oil capsules, which offer similar benefits.The best foods for omega-3 fatty acids include wild salmon (fresh, canned), herring, mackerel (not king), sardines, anchovies, rainbow trout, and Pacific oysters. Non-fish sources of omega-3 fats include omega-3-fortified eggs, ground flaxseed, chia seeds, walnuts, butternuts (white walnuts), seaweed, walnut oil, canola oil, and soybeans.  --> Quit smoking.  Smoking causes inflammation, not just in your lungs, but throughout your body. Inflammation can contribute to atherosclerosis, blood clots, and risk of heart attack. Smoking makes all heart health indicators worse. If you have high cholesterol, high triglycerides,  or high blood pressure, smoking magnifies the danger.  --> Become more physically active.  Even moderate exercise can help improve cholesterol, triglycerides, and blood pressure. Aerobic exercise seems to be able to stop the sharp rise of triglycerides after eating, perhaps because of a decrease in the amount of triglyceride released by the liver, or because active muscle clears triglycerides out of the blood stream more quickly than inactive muscle. If you haven't exercised regularly (or at all) for years, I recommend starting slowly, by walking at an easy pace for 15 minutes a day. Then, as you feel more comfortable, increase the amount. Your ultimate goal should be at least 30 minutes of moderate physical activity, at least five days a week.     Nine ways to increase your "good" HDL cholesterol  High-density lipoprotein (HDL) is often referred to as the "good" cholesterol. Having high HDL levels helps carry cholesterol from your arteries to your liver, where it can be used or excreted.  Having high levels of HDL also has antioxidant and anti-inflammatory effects, and is linked to a reduced risk of  heart disease (1, 2).  Most health experts recommend minimum blood levels of 40 mg/dl in men and 50 mg/dl in women.  While genetics definitely play a role, there are several other factors that affect HDL levels.  Here are nine healthy ways to raise your "good" HDL cholesterol.  1. Consume olive oil  two pieces of salmon on a plate olive oil being poured into a small dish Extra virgin olive oil may be more healthful than processed olive oils. Olive oil is one of the healthiest fats around.  A large analysis of 42 studies with more than 800,000 participants found that olive oil was the only source of monounsaturated fat that seemed to reduce heart disease risk (3).  Research has shown that one of olive oil's heart-healthy effects is an increase in HDL cholesterol. This effect is thought to be  caused by antioxidants it contains called polyphenols (4, 5, 6, 7).  Extra virgin olive oil has more polyphenols than more processed olive oils, although the amount can still vary among different types and brands.  One study gave 200 healthy young men about 2 tablespoons (25 ml) of different olive oils per day for three weeks.  The researchers found that participants' HDL levels increased significantly more after they consumed the olive oil with the highest polyphenol content (6).  In another study, when 58 older adults consumed about 4 tablespoons (50 ml) of high-polyphenol extra virgin olive oil every day for six weeks, their HDL cholesterol increased by 6.5 mg/dl, on average (7).  In addition to raising HDL levels, olive oil has been found to boost HDL's anti-inflammatory and antioxidant function in studies of older people and individuals with high cholesterol levels ( 7, 8, 9).  Whenever possible, select high-quality, certified extra virgin olive oils, which tend to be highest in polyphenols.  Bottom line: Extra virgin olive oil with a high polyphenol content has been shown to increase HDL levels in healthy people, the elderly and individuals with high cholesterol.  2. Follow a low-carb or ketogenic diet  Low-carb and ketogenic diets provide a number of health benefits, including weight loss and reduced blood sugar levels.  They have also been shown to increase HDL cholesterol in people who tend to have lower levels.  This includes those who are obese, insulin resistant or diabetic (10, 11, 12, 13, 14, 15, 16, 17).  In one study, people with type 2 diabetes were split into two groups.  One followed a diet consuming less than 50 grams of carbs per day. The other followed a high-carb diet.  Although both groups lost weight, the low-carb group's HDL cholesterol increased almost twice as much as the high-carb group's did (14).  In another study, obese people who followed a low-carb diet  experienced an increase in HDL cholesterol of 5 mg/dl overall.  Meanwhile, in the same study, the participants who ate a low-fat, high-carb diet showed a decrease in HDL cholesterol (15).  This response may partially be due to the higher levels of fat people typically consume on low-carb diets.  One study in overweight women found that diets high in meat and cheese increased HDL levels by 5-8%, compared to a higher-carb diet (18).  What's more, in addition to raising HDL cholesterol, very-low-carb diets have been shown to decrease triglycerides and improve several other risk factors for heart disease (13, 14, 16, 17).  Bottom line: Low-carb and ketogenic diets typically increase HDL cholesterol levels in people with diabetes, metabolic syndrome and obesity.  3. Exercise regularly  Being physically active is important for heart health.  Studies have shown that many different types of exercise are effective at raising HDL cholesterol, including strength training, high-intensity exercise and aerobic exercise (19, 20, 21, 22, 23, 24).  However, the biggest increases in HDL are typically seen with high-intensity exercise.  One small study followed women who were living with polycystic ovary syndrome (PCOS), which is linked to a higher risk of insulin resistance. The study required them to perform high-intensity exercise three times a week.  The exercise led to an increase in HDL cholesterol of 8 mg/dL after 10 weeks. The women also showed improvements in other health markers, including decreased insulin resistance and improved arterial function (23).  In a 12-week study, overweight men who performed high-intensity exercise experienced a 10% increase in HDL cholesterol.  In contrast, the low-intensity exercise group showed only a 2% increase and the endurance training group experienced no change (24).  However, even lower-intensity exercise seems to increase HDL's anti-inflammatory and  antioxidant capacities, whether or not HDL levels change (20, 21, 25).  Overall, high-intensity exercise such as high-intensity interval training (HIIT) and high-intensity circuit training (HICT) may boost HDL cholesterol levels the most.  Bottom line: Exercising several times per week can help raise HDL cholesterol and enhance its anti-inflammatory and antioxidant effects. High-intensity forms of exercise may be especially effective.  4. Add coconut oil to your diet  Studies have shown that coconut oil may reduce appetite, increase metabolic rate and help protect brain health, among other benefits.  Some people may be concerned about coconut oil's effects on heart health due to its high saturated fat content.  However, it appears that coconut oil is actually quite heart healthy.  Coconut oil tends to raise HDL cholesterol more than many other types of fat.  In addition, it may improve the ratio of low-density-lipoprotein (LDL) cholesterol, the "bad" cholesterol, to HDL cholesterol. Improving this ratio reduces heart disease risk (26, 27, 28, 29).  One study examined the health effects of coconut oil on 60 women with excess belly fat. The researchers found that participants who took coconut oil daily experienced increased HDL cholesterol and a lower LDL-to-HDL ratio.  In contrast, the group who took soybean oil daily had a decrease in HDL cholesterol and an increase in the LDL-to-HDL ratio (29).  Most studies have found these health benefits occur at a dosage of about 2 tablespoons (30 ml) of coconut oil per day. It's best to incorporate this into cooking rather than eating spoonfuls of coconut oil on their own.  Bottom line: Consuming 2 tablespoons (30 ml) of coconut oil per day may help increase HDL cholesterol levels.  5. Stop smoking  cigarette butt Quitting smoking can reduce the risk of heart disease and lung cancer. Smoking increases the risk of many health problems, including  heart disease and lung cancer (30).  One of its negative effects is a suppression of HDL cholesterol.  Some studies have found that quitting smoking can increase HDL levels. Indeed, one study found no significant differences in HDL levels between former smokers and people who had never smoked (31, 32, 33, 34, 35).  In a one-year study of more than 1,500 people, those who quit smoking had twice the increase in HDL as those who resumed smoking within the year. The number of large HDL particles also increased, which further reduced heart disease risk (32).  One study followed smokers who switched from traditional cigarettes to electronic cigarettes  for one year. They found that the switch was associated with an increase in HDL cholesterol of 5 mg/dl, on average (33).  When it comes to the effect of nicotine replacement patches on HDL levels, research results have been mixed.  One study found that nicotine replacement therapy led to higher HDL cholesterol. However, other research suggests that people who use nicotine patches likely won't see increases in HDL levels until after replacement therapy is completed (34, 36).  Even in studies where HDL cholesterol levels didn't increase after people quit smoking, HDL function improved, resulting in less inflammation and other beneficial effects on heart health (37).  Bottom line: Quitting smoking can increase HDL levels, improve HDL function and help protect heart health.  6. Lose weight  When overweight and obese people lose weight, their HDL cholesterol levels usually increase.  What's more, this benefit seems to occur whether weight loss is achieved by calorie counting, carb restriction, intermittent fasting, weight loss surgery or a combination of diet and exercise (16, 38, 39, 40, 41, 42).  One study examined HDL levels in more than 3,000 overweight and obese [place] adults who followed a lifestyle modification program for one year.  The  researchers found that losing at least 6.6 lbs (3 kg) led to an increase in HDL cholesterol of 4 mg/dl, on average (41).  In another study, when obese people with type 2 diabetes consumed calorie-restricted diets that provided 20-30% of calories from protein, they experienced significant increases in HDL cholesterol levels (42).  The key to achieving and maintaining healthy HDL cholesterol levels is choosing the type of diet that makes it easiest for you to lose weight and keep it off.  Bottom Line: Several methods of weight loss have been shown to increase HDL cholesterol levels in people who are overweight or obese.  7. Choose purple produce  Consuming purple-colored fruits and vegetables is a delicious way to potentially increase HDL cholesterol.  Purple produce contains antioxidants known as anthocyanins.  Studies using anthocyanin extracts have shown that they help fight inflammation, protect your cells from damaging free radicals and may also raise HDL cholesterol levels (43, 44, 45, 46).  In a 24-week study of 57 people with diabetes, those who took an anthocyanin supplement twice a day experienced a 19% increase in HDL cholesterol, on average, along with other improvements in heart health markers (45).  In another study, when people with cholesterol issues took anthocyanin extract for 12 weeks, their HDL cholesterol levels increased by 13.7% (46).  Although these studies used extracts instead of foods, there are several fruits and vegetables that are very high in anthocyanins. These include eggplant, purple corn, red cabbage, blueberries, blackberries and black raspberries.  Bottom line: Consuming fruits and vegetables rich in anthocyanins may help increase HDL cholesterol levels.  8. Eat fatty fish often  The omega-3 fats in fatty fish provide major benefits to heart health, including a reduction in inflammation and better functioning of the cells that line your arteries (47,  48).  There's some research showing that eating fatty fish or taking fish oil may also help raise low levels of HDL cholesterol (49, 50, 51, 52, 53).  In a study of 33 heart disease patients, participants that consumed fatty fish four times per week experienced an increase in HDL cholesterol levels. The particle size of their HDL also increased (52).  In another study, overweight men who consumed herring five days a week for six weeks had a 5% increase in HDL cholesterol,  compared with their levels after eating lean pork and chicken five days a week (53).  However, there are a few studies that found no increase in HDL cholesterol in response to increased fish or omega-3 supplement intake (54, 55).  In addition to herring, other types of fatty fish that may help raise HDL cholesterol include salmon, sardines, mackerel and anchovies.  Bottom line: Eating fatty fish several times per week may help increase HDL cholesterol levels and provide other benefits to heart health.  9. Avoid artificial trans fats  Artificial trans fats have many negative health effects due to their inflammatory properties (56, 57).  There are two types of trans fats. One kind occurs naturally in animal products, including full-fat dairy.  In contrast, the artificial trans fats found in margarines and processed foods are created by adding hydrogen to unsaturated vegetable and seed oils. These fats are also known as industrial trans fats or partially hydrogenated fats.  Research has shown that, in addition to increasing inflammation and contributing to several health problems, these artificial trans fats may lower HDL cholesterol levels.  In one study, researchers compared how people's HDL levels responded when they consumed different margarines.  The study found that participants' HDL cholesterol levels were 10% lower after consuming margarine containing partially hydrogenated soybean oil, compared to their levels after  consuming palm oil (58).  Another controlled study followed 40 adults who had diets high in different types of trans fats.  They found that HDL cholesterol levels in women were significantly lower after they consumed the diet high in industrial trans fats, compared to the diet containing naturally occurring trans fats (59).  To protect heart health and keep HDL cholesterol in the healthy range, it's best to avoid artificial trans fats altogether.  Bottom line: Artificial trans fats have been shown to lower HDL levels and increase inflammation, compared to other fats.  Take home message  Although your HDL cholesterol levels are partly determined by your genetics, there are many things you can do to naturally increase your own levels.  Fortunately, the practices that raise HDL cholesterol often provide other health benefits as well.    Guidelines for a Low Cholesterol, Low Saturated Fat Diet   Fats - Limit total intake of fats and oils. - Avoid butter, stick margarine, shortening, lard, palm and coconut oils. - Limit mayonnaise, salad dressings, gravies and sauces, unless they are homemade with low-fat ingredients. - Limit chocolate. - Choose low-fat and nonfat products, such as low-fat mayonnaise, low-fat or non-hydrogenated peanut butter, low-fat or fat-free salad dressings and nonfat gravy. - Use vegetable oil, such as canola or olive oil. - Look for margarine that does not contain trans fatty acids. - Use nuts in moderate amounts. - Read ingredient labels carefully to determine both amount and type of fat present in foods. Limit saturated and trans fats! - Avoid high-fat processed and convenience foods.  Meats and Meat Alternatives - Choose fish, chicken, [place] and lean meats. - Use dried beans, peas, lentils and tofu. - Limit egg yolks to three to four per week. - If you eat red meat, limit to no more than three servings per week and choose loin or round cuts. - Avoid fatty  meats, such as bacon, sausage, franks, luncheon meats and ribs. - Avoid all organ meats, including liver.  Dairy - Choose nonfat or low-fat milk, yogurt and cottage cheese. - Most cheeses are high in fat. Choose cheeses made from non-fat milk, such as mozzarella and ricotta  cheese. - Choose light or fat-free cream cheese and sour cream. - Avoid cream and sauces made with cream.  Fruits and Vegetables - Eat a wide variety of fruits and vegetables. - Use lemon juice, vinegar or "mist" olive oil on vegetables. - Avoid adding sauces, fat or oil to vegetables.  Breads, Cereals and Grains - Choose whole-grain breads, cereals, pastas and rice. - Avoid high-fat snack foods, such as granola, cookies, pies, pastries, doughnuts and croissants.  Cooking Tips - Avoid deep fried foods. - Trim visible fat off meats and remove skin from poultry before cooking. - Bake, broil, boil, poach or roast poultry, fish and lean meats. - Drain and discard fat that drains out of meat as you cook it. - Add little or no fat to foods. - Use vegetable oil sprays to grease pans for cooking or baking. - Steam vegetables. - Use herbs or no-oil marinades to flavor foods.

## 2017-06-09 ENCOUNTER — Other Ambulatory Visit: Payer: Self-pay | Admitting: Family Medicine

## 2017-06-09 DIAGNOSIS — I10 Essential (primary) hypertension: Secondary | ICD-10-CM

## 2017-06-09 DIAGNOSIS — E782 Mixed hyperlipidemia: Secondary | ICD-10-CM

## 2017-06-09 DIAGNOSIS — E781 Pure hyperglyceridemia: Secondary | ICD-10-CM

## 2017-06-10 DIAGNOSIS — L709 Acne, unspecified: Secondary | ICD-10-CM | POA: Diagnosis not present

## 2017-06-10 DIAGNOSIS — L810 Postinflammatory hyperpigmentation: Secondary | ICD-10-CM | POA: Diagnosis not present

## 2017-07-14 DIAGNOSIS — L810 Postinflammatory hyperpigmentation: Secondary | ICD-10-CM | POA: Diagnosis not present

## 2017-07-14 DIAGNOSIS — L709 Acne, unspecified: Secondary | ICD-10-CM | POA: Diagnosis not present

## 2017-09-22 ENCOUNTER — Other Ambulatory Visit: Payer: BLUE CROSS/BLUE SHIELD

## 2017-09-22 ENCOUNTER — Other Ambulatory Visit: Payer: Self-pay

## 2017-09-22 DIAGNOSIS — E781 Pure hyperglyceridemia: Secondary | ICD-10-CM

## 2017-09-22 DIAGNOSIS — E559 Vitamin D deficiency, unspecified: Secondary | ICD-10-CM

## 2017-09-23 LAB — LIPID PANEL
CHOLESTEROL TOTAL: 192 mg/dL (ref 100–199)
Chol/HDL Ratio: 2.4 ratio (ref 0.0–5.0)
HDL: 79 mg/dL (ref >39)
LDL Calculated: 90 mg/dL (ref 0–99)
TRIGLYCERIDES: 114 mg/dL (ref 0–149)
VLDL Cholesterol Cal: 23 mg/dL (ref 5–40)

## 2017-09-23 LAB — VITAMIN D 25 HYDROXY (VIT D DEFICIENCY, FRACTURES): Vit D, 25-Hydroxy: 119 ng/mL — ABNORMAL HIGH (ref 30.0–100.0)

## 2017-09-27 ENCOUNTER — Ambulatory Visit (INDEPENDENT_AMBULATORY_CARE_PROVIDER_SITE_OTHER): Payer: BLUE CROSS/BLUE SHIELD | Admitting: Family Medicine

## 2017-09-27 ENCOUNTER — Encounter: Payer: Self-pay | Admitting: Family Medicine

## 2017-09-27 VITALS — BP 132/78 | HR 78 | Ht 70.0 in | Wt 196.0 lb

## 2017-09-27 DIAGNOSIS — E782 Mixed hyperlipidemia: Secondary | ICD-10-CM | POA: Diagnosis not present

## 2017-09-27 DIAGNOSIS — R7302 Impaired glucose tolerance (oral): Secondary | ICD-10-CM

## 2017-09-27 DIAGNOSIS — R945 Abnormal results of liver function studies: Secondary | ICD-10-CM

## 2017-09-27 DIAGNOSIS — R7989 Other specified abnormal findings of blood chemistry: Secondary | ICD-10-CM

## 2017-09-27 DIAGNOSIS — E559 Vitamin D deficiency, unspecified: Secondary | ICD-10-CM

## 2017-09-27 DIAGNOSIS — E781 Pure hyperglyceridemia: Secondary | ICD-10-CM

## 2017-09-27 DIAGNOSIS — I10 Essential (primary) hypertension: Secondary | ICD-10-CM | POA: Diagnosis not present

## 2017-09-27 DIAGNOSIS — F1010 Alcohol abuse, uncomplicated: Secondary | ICD-10-CM

## 2017-09-27 DIAGNOSIS — E663 Overweight: Secondary | ICD-10-CM

## 2017-09-27 NOTE — Patient Instructions (Addendum)
Continue the great work - you are doing fabulous with your diet and lifestyle changes!!!  Stop the OTC vit D supp.  You will get your vitamin D slowly from the sun and any foods fortified with vitamin D.  We will see you in early January for repeat physical exam with fasting blood work etc.

## 2017-09-27 NOTE — Progress Notes (Signed)
Assessment and Plan:  1. Hypertriglyceridemia   2. Mixed hyperlipidemia   3. Essential hypertension   4. Vitamin D deficiency   5. Overweight (BMI 25.0-29.9)   6. Glucose intolerance (impaired glucose tolerance)   7. Excessive drinking of alcohol   8. h/o Elevated LFTs     Hypertension:  - bp stable -Discussed importance of continuing positive lifestyle changes to maintain control of bp  -Encouraged pt to continue regular exercise  -Cont txmnt regimen due to stable BP's here and home -Importance of ambulatory blood pressure monitoring d/c pt  Cholesterol:  - latest panel looks great on Sept 4th, '19 - LDL and TG significantly dec and HDL significantly increased!  -Discussed impact of decreasing fatty carbs such as baked goods on triglyceride levels -Educated pt on impact of regular exercise such as his golf on improving his HDL  -Discussed importance of avoiding fatty foods and balanced diet while making lifestyle changes   Prediabetes:  - A1c well controlled Lab Results  Component Value Date   HGBA1C 5.7 (H) 06/04/2017   HGBA1C 5.9 (H) 01/25/2017   HGBA1C 5.6 01/07/2016  -Encouraged pt to continue making positive dietary changes -Discussed positive impact of weight loss and low carb diet on preventing prediabetes progression -Check A1C during next appointment -Educated pt on ability to prevent diabetes with continued healthy choices   Alcohol misuse -Discussed impact of alcohol misuse on blood sugar and encouraged pt to continue to dec intake even further than he already has -Discussed impact of beer on BS and TG levels -encouraged pt to consider hard seltzer and vodka choices to decrease impact on BS   Vitamin D:   -DC vitamin D supplement;  Vitamin D level above goal  Weight Mgt: -Encouraged pt to continue making positive changes to continue weight loss- over 20+ pounds since March - pt to cont low-carb diet which is working for pt -Discussed  low-carb vegetable options to [employer] such as broccoli, cauliflower, and salads -Encouraged pt to consider berries as low-sugar option for fruits   -American Heart Association guidelines for healthy diet, basically Mediterranean diet, and exercise guidelines of 30 minutes 5 days per week or more discussed in detail.  -Reminded patient the need for yearly complete physical exam office visits in addition to office visits for management of the chronic diseases  -Gross side effects, risk and benefits, and alternatives of medications discussed with patient.  Patient is aware that all medications have potential side effects and we are unable to predict every side effect or drug-drug interaction that may occur.  Expresses verbal understanding and consents to current therapy plan and treatment regimen.   Follow Up:  Return for January 2020 for complete physical exam with fasting blood work.   Please see AVS handed out to patient at the end of our visit for further patient instructions/ counseling done pertaining to today's office visit.    Note:  This document was prepared using Dragon voice recognition software and may include unintentional dictation errors.  This document serves as a record of services personally performed by Mellody Dance, MD. It was created on her behalf by Georga Bora, a trained medical scribe. The creation of this record is based on the scribe's personal observations and the provider's statements to them.   I have reviewed the above medical documentation for accuracy and completeness and I concur.  Mellody Dance 09/27/17 9:16 PM    ----------------------------------------------------------------------------------------------------------------------  Subjective:  HPI: Keith Bullock  57 y.o. male  presents for 3 month follow up for multiple medical problems.  - r/w pt vit D levels and recent chol panel   HPI:  Keith Bullock y.o. male presents for  3 month follow up for multiple medical problems.  Acute Cold -Symptom onset 4 days ago -Experiencing runny nose, fatigue, indigestion -States he caught "a little bug" from his grandson starting school but is not worried about symptoms   Alcohol abuse -States he has been "working on" his alcohol intake -Has switched to hard seltzers in order to decrease caloric intake -Drinks around 4 during a round of golf -States he may have a glass of red wine with dinner but doesn't drink much outside of golf -Wants to decrease alcohol intake further   Prediabetes  -Pt reports compliance w/ treatment plan Lab Results  Component Value Date   HGBA1C 5.7 (H) 06/04/2017   HGBA1C 5.9 (H) 01/25/2017   HGBA1C 5.6 01/07/2016    Lab Results  Component Value Date   LDLCALC 90 09/22/2017   CREATININE 1.21 06/04/2017     Hyperlipidemia -Pt reports compliance with medications and/ or treatment plan -Denies S-E  -RUQ pain-  denies  -Muscle aches- denies -Patient reports good compliance with low fat/low cholesterol diet and healthier lifestyle modifications.   Last lipid panel as follows:  Lab Results  Component Value Date   CHOL 192 09/22/2017   HDL 79 09/22/2017   LDLCALC 90 09/22/2017   TRIG 114 09/22/2017   CHOLHDL 2.4 09/22/2017     Hypertension -PT BP 134/72 in OV today -Pt reports compliance with medications and/ or treatment plan -Pt taking his bp medication every day -Denies S-E. -Home Blood pressure range-  130's/70's at home Patient denies new onset of sx- no chest pain, dizziness, HA, DIB/ shortness of breath or swelling.   Lab Results  Component Value Date   CREATININE 1.21 06/04/2017    Last 3 blood pressure readings in our office are as follows: BP Readings from Last 3 Encounters:  09/27/17 132/78  06/08/17 127/84  04/13/17 136/82    The CVD Risk score (D'Agostino, et al., 2008) failed to calculate for the following reasons:   CVD risk score not  calculated   Weight: -Pt has lost almost 20lbs since March due to dietary and lifestyle changes -States he has cut out carbs and is making better food choices -Has been eating more lean meat and lots of vegetables with meals -States his mood is better and his body feels better  -Pt plays golf 4-5 days per week and is active with his grandchildren  Wt Readings from Last 3 Encounters:  09/27/17 196 lb (88.9 kg)  06/08/17 208 lb 14.4 oz (94.8 kg)  04/13/17 214 lb 3.2 oz (97.2 kg)   BMI Readings from Last 3 Encounters:  09/27/17 28.12 kg/m  06/08/17 29.97 kg/m  04/13/17 30.73 kg/m       Patient Care Team    Relationship Specialty Notifications Start End  Mellody Dance, DO PCP - General Family Medicine  01/07/16   Wilford Corner, MD Consulting Physician Gastroenterology  01/07/16   Warren Danes, PA-C Physician Assistant Dermatology  01/07/16   Carolan Clines, MD Consulting Physician Urology  01/07/16    Comment: Jennette Dubin, MD Consulting Physician Infectious Diseases  01/07/16      Review of Systems: General:   Denies fever, chills, unexplained weight loss.  Optho/Auditory:   Denies visual changes, blurred vision/LOV Respiratory:  Denies SOB, DOE more than baseline levels.   Cardiovascular:   Denies chest pain, palpitations, new onset peripheral edema  Gastrointestinal:   Denies nausea, vomiting, diarrhea.  Genitourinary: Denies dysuria, freq/ urgency, flank pain or discharge from genitals.  Endocrine:     Denies hot or cold intolerance, polyuria, polydipsia. Musculoskeletal:   Denies unexplained myalgias, joint swelling, unexplained arthralgias, gait problems.  Skin:  Denies rash, suspicious lesions Neurological:     Denies dizziness, unexplained weakness, numbness  Psychiatric/Behavioral:   Denies mood changes, suicidal or homicidal ideations, hallucinations    Objective: Physical Exam: BP 132/78   Pulse 78   Ht 5\' 10"   (1.778 m)   Wt 196 lb (88.9 kg)   SpO2 98%   BMI 28.12 kg/m  Body mass index is 28.12 kg/m. General: Well nourished, in no apparent distress. Eyes: PERRLA, EOMs, conjunctiva clr no swelling or erythema ENT/Mouth: Hearing appears normal.  Mucus Membranes Moist  Neck: Supple, no masses, no carotid bruits b/l Resp: Respiratory effort- normal, ECTA B/L w/o W/R/R  Cardio: RRR w/o MRGs. Skin: Warm, dry without rashes, lesions, ecchymosis.  Neuro: Alert, Oriented Psych: Normal affect, Insight and Judgment appropriate.    Current Medications:  Current Outpatient Medications on File Prior to Visit  Medication Sig Dispense Refill  . amLODipine (NORVASC) 10 MG tablet TAKE 1 TABLET BY MOUTH DAILY. 90 tablet 1  . Biotin 5000 MCG TABS Take 1 tablet by mouth daily.    . cetirizine (ZYRTEC) 10 MG tablet Take 10 mg by mouth daily as needed for allergies.    . Cyanocobalamin (B-12) 500 MCG TABS Take 1 tablet by mouth daily.    Marland Kitchen ezetimibe (ZETIA) 10 MG tablet TAKE 1 TABLET BY MOUTH AT BEDTIME. 90 tablet 1  . Famotidine-Ca Carb-Mag Hydrox (PEPCID COMPLETE PO) Take 1 tablet by mouth daily as needed (acid reflux).    . ISOtretinoin (CLARAVIS) 40 MG capsule Take 40 mg by mouth 2 (two) times daily.    . Multiple Vitamin (MULTIVITAMIN WITH MINERALS) TABS tablet Take 1 tablet by mouth daily.    . Naphazoline-Pheniramine (OPCON-A) 0.027-0.315 % SOLN Apply 1 drop to eye daily as needed (dry eyes).    . naproxen sodium (ANAPROX) 220 MG tablet Take 220 mg by mouth daily as needed (pain).     Marland Kitchen omega-3 acid ethyl esters (LOVAZA) 1 g capsule Take 2 capsules (2 g total) by mouth 2 (two) times daily. 360 capsule 3  . sildenafil (REVATIO) 20 MG tablet Take 60 mg by mouth daily as needed (erectile dysfunction).    . traMADol (ULTRAM) 50 MG tablet Take 1 tablet (50 mg total) by mouth every 6 (six) hours as needed. 30 tablet 0  . valsartan-hydrochlorothiazide (DIOVAN-HCT) 320-25 MG tablet TAKE 1 TABLET BY MOUTH DAILY.  90 tablet 1   No current facility-administered medications on file prior to visit.     Medical History:  Patient Active Problem List   Diagnosis Date Noted  . Hypertriglyceridemia 01/07/2016    Priority: High  . HLD (hyperlipidemia) 01/07/2016    Priority: High  . Essential hypertension 08/26/2015    Priority: High  . H/o back strain - M-SK and rad to sides/ front 01/07/2016    Priority: Medium  . Excessive drinking of alcohol 01/07/2016    Priority: Medium  . Obesity, Class I, BMI 30-34.9 01/07/2016    Priority: Medium  . h/o recent MRSA infection 08/26/2015    Priority: Medium  . Elevated PSA measurement 01/07/2016  Priority: Low  . Decreased libido 01/07/2016    Priority: Low  . ED (erectile dysfunction) 01/07/2016    Priority: Low  . Glucose intolerance (impaired glucose tolerance) 06/08/2017  . High risk medication use-  Accutane [facility] 06/08/2017  . Vitamin D deficiency 06/08/2017  . Chronic periumbilical pain with hernia 12/14/2016  . Irreducible umbilical hernia 61/84/8592  . Callus of foot 12/14/2016  . Elevated fasting blood sugar- 08/2016 w derm labs 12/14/2016  . Elevated LFTs 08/12/2016  . Myalgia due to statin 01/07/2016    Allergies:  Allergies  Allergen Reactions  . Flomax [Tamsulosin Hcl] Anxiety     Family history-  Reviewed; changed as appropriate  Social history-  Reviewed; changed as appropriate

## 2017-10-29 ENCOUNTER — Telehealth: Payer: Self-pay | Admitting: Family Medicine

## 2017-10-29 NOTE — Telephone Encounter (Signed)
Patient has had acute back pain in the past and it is troubling him again. He has been prescribed Flexeril in the past by our clinic for these flare ups. He is hoping that a refill could be called into [facility]. Please advise

## 2017-11-01 NOTE — Telephone Encounter (Signed)
Spoke to the patient and he states that he is having right side mid back pain x 3 days.  Patient believes that he may have pulled something playing with his grandson. Patient states that Flexeril has helped in the past.  Medication was last filled on 02/18/2017 for #30.  Please review and advise. MPulliam, CMA/RT(R)

## 2017-11-02 ENCOUNTER — Other Ambulatory Visit: Payer: Self-pay

## 2017-11-02 DIAGNOSIS — M546 Pain in thoracic spine: Secondary | ICD-10-CM

## 2017-11-02 MED ORDER — CYCLOBENZAPRINE HCL 10 MG PO TABS: 10.0000 mg | ORAL_TABLET | Freq: Three times a day (TID) | ORAL | 0 refills | Status: DC | PRN

## 2017-11-02 NOTE — Progress Notes (Signed)
Per Dr. Raliegh Scarlet refill for flexeril sent into the pharmacy.  Patient notified. MPulliam, CMA/RT(R)

## 2017-11-02 NOTE — Telephone Encounter (Signed)
Ok to [employer], thnx

## 2017-11-02 NOTE — Telephone Encounter (Signed)
Sent in RF and patient notified. MPulliam, CMA/RT(R)

## 2017-12-13 ENCOUNTER — Other Ambulatory Visit: Payer: Self-pay | Admitting: Family Medicine

## 2017-12-13 DIAGNOSIS — E782 Mixed hyperlipidemia: Secondary | ICD-10-CM

## 2017-12-13 DIAGNOSIS — E781 Pure hyperglyceridemia: Secondary | ICD-10-CM

## 2018-02-01 ENCOUNTER — Encounter: Payer: Self-pay | Admitting: Family Medicine

## 2018-02-01 ENCOUNTER — Ambulatory Visit (INDEPENDENT_AMBULATORY_CARE_PROVIDER_SITE_OTHER): Payer: BLUE CROSS/BLUE SHIELD | Admitting: Family Medicine

## 2018-02-01 VITALS — BP 129/88 | HR 68 | Temp 98.3°F | Ht 70.0 in | Wt 201.8 lb

## 2018-02-01 DIAGNOSIS — Z1211 Encounter for screening for malignant neoplasm of colon: Secondary | ICD-10-CM | POA: Diagnosis not present

## 2018-02-01 DIAGNOSIS — Z79899 Other long term (current) drug therapy: Secondary | ICD-10-CM | POA: Diagnosis not present

## 2018-02-01 DIAGNOSIS — R7302 Impaired glucose tolerance (oral): Secondary | ICD-10-CM

## 2018-02-01 DIAGNOSIS — Z719 Counseling, unspecified: Secondary | ICD-10-CM

## 2018-02-01 DIAGNOSIS — R7301 Impaired fasting glucose: Secondary | ICD-10-CM

## 2018-02-01 DIAGNOSIS — Z114 Encounter for screening for human immunodeficiency virus [HIV]: Secondary | ICD-10-CM

## 2018-02-01 DIAGNOSIS — E781 Pure hyperglyceridemia: Secondary | ICD-10-CM | POA: Diagnosis not present

## 2018-02-01 DIAGNOSIS — Z125 Encounter for screening for malignant neoplasm of prostate: Secondary | ICD-10-CM | POA: Diagnosis not present

## 2018-02-01 DIAGNOSIS — Z0000 Encounter for general adult medical examination without abnormal findings: Secondary | ICD-10-CM | POA: Diagnosis not present

## 2018-02-01 DIAGNOSIS — E782 Mixed hyperlipidemia: Secondary | ICD-10-CM | POA: Diagnosis not present

## 2018-02-01 DIAGNOSIS — E559 Vitamin D deficiency, unspecified: Secondary | ICD-10-CM

## 2018-02-01 DIAGNOSIS — F1010 Alcohol abuse, uncomplicated: Secondary | ICD-10-CM

## 2018-02-01 LAB — POC HEMOCCULT BLD/STL (OFFICE/1-CARD/DIAGNOSTIC): Fecal Occult Blood, POC: NEGATIVE

## 2018-02-01 NOTE — Progress Notes (Signed)
Male physical  Impression and Recommendations:    1. Encounter for wellness examination   2. Health education/counseling   3. Glucose intolerance (impaired glucose tolerance)   4. Hypertriglyceridemia   5. Mixed hyperlipidemia   6. Excessive drinking of alcohol   7. Elevated fasting blood sugar- 08/2016 w derm labs   8. High risk medication use-  Accutane from Derm   9. Vitamin D deficiency   10. Encounter for screening fecal occult blood testing   11. Encounter for screening for HIV   12. Screening PSA (prostate specific antigen)     1) Anticipatory Guidance: Discussed importance of wearing a seatbelt while driving, not texting while driving;   sunscreen when outside along with skin surveillance; eating a balanced and modest diet; physical activity at least 25 minutes per day or 150 min/ week moderate to intense activity.  - To help prevent hemorrhoids, drink adequate water, keep stools loose, and avoid straining.  - Patient knows to keep an eye on his blood pressure and look for it to be 135/85 on a regular basis.  2) Immunizations / Screenings / Labs:  All immunizations are up-to-date per recommendations or will be updated today. Patient is due for dental and vision screens which pt will schedule independently. Will obtain CBC, CMP, HgA1c, Lipid panel, TSH and vit D when fasting, if not already done recently.   - Patient's last colonoscopy was obtained 10/2015, with repeat in 5 years.  - Due for next TDAP 2023.  - Patient had shingles vaccine recently.  - Per patient had flu vaccine two months ago.  - Need for low-risk HIV/Hepatitis C screen.  - Patient sent home with Patient notes that he does have hemorrhoids.  3) Weight:   BMI meaning discussed with patient.  Discussed goal of losing 5-10% of current body weight which would improve overall feelings of well being and improve objective health data. Improve nutrient density of diet through increasing intake of  fruits and vegetables and decreasing saturated fats, white flour products and refined sugars.   4) BMI Counseling - BMI of 28.96 Explained to patient what BMI refers to, and what it means medically.    Told patient to think about it as a "medical risk stratification measurement" and how increasing BMI is associated with increasing risk/ or worsening state of various diseases such as hypertension, hyperlipidemia, diabetes, premature OA, depression etc.  American Heart Association guidelines for healthy diet, basically Mediterranean diet, and exercise guidelines of 30 minutes 5 days per week or more discussed in detail.  Health counseling performed.  All questions answered.  5) Lifestyle & Preventative Health Maintenance - Advised patient to continue working toward exercising to improve overall mental, physical, and emotional health.    - Reviewed the "spokes of the wheel" of mood and health management.  Stressed the importance of ongoing prudent habits, including regular exercise, appropriate sleep hygiene, healthful dietary habits, and prayer/meditation to relax.  - Encouraged patient to engage in daily physical activity, especially a formal exercise routine.  Recommended that the patient eventually strive for at least 150 minutes of moderate cardiovascular activity per week according to guidelines established by the [facility].   - Healthy dietary habits encouraged, including low-carb, and high amounts of lean protein in diet.   - Patient should also consume adequate amounts of water.   Orders Placed This Encounter  Procedures  . CBC with Differential/Platelet  . Comprehensive metabolic panel  . Hemoglobin A1c  . Lipid panel  .  T4, free  . TSH  . VITAMIN D 25 Hydroxy (Vit-D Deficiency, Fractures)  . PSA  . HIV antibody (with reflex)  . POC Hemoccult Bld/Stl (1-Cd Office Dx)    Gross side effects, risk and benefits, and alternatives of medications discussed with patient.  Patient is aware  that all medications have potential side effects and we are unable to predict every side effect or drug-drug interaction that may occur.  Expresses verbal understanding and consents to current therapy plan and treatment regimen.  Please see AVS handed out to patient at the end of our visit for further patient instructions/ counseling done pertaining to today's office visit.  Follow-up preventative CPE in 1 year. Follow-up office visit pending lab work.  F/up sooner for chronic care management and/or prn  This document serves as a record of services personally performed by Mellody Dance, DO. It was created on her behalf by Toni Amend, a trained medical scribe. The creation of this record is based on the scribe's personal observations and the provider's statements to them.   I have reviewed the above medical documentation for accuracy and completeness and I concur.  Mellody Dance, DO 02/01/2018 12:12 PM       Subjective:    CC: CPE  HPI: Keith Bullock is a 58 y.o. male who presents to [place] at [facility] today for a yearly health maintenance exam.    Health Maintenance Summary Reviewed and updated, unless pt declines services.  Colonoscopy:  Patient's last colonoscopy was obtained 10/2015, with repeat in 5 years. Patient notes that he does have hemorrhoids. Tobacco History Reviewed:  Never smoker. Alcohol:  No concerns, no excessive use. Exercise Habits:  Not meeting AHA guidelines.  Does body squats and resistance training with bands and body weight.  States that he's been cleaning out the upstairs of the house to prepare for his mother-in-law to move into the house with them. STD concerns:  None. Drug Use:  None. Birth control method:  N/a. Testicular/penile concerns:  Denies sexual health problems or concerns.  Says he's been "sexually better as I've lost some of the weight."  Has no need for assistance.  Used to weigh near 230 lbs.  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  Dermatological Health Denies rashes, lumps, bumps, or any other dermatological concerns.  Visual [facility] Goes to [facility] for follow-up.  Urinary/Prostate Symptoms States he does have a large prostate; he urinates frequently at night, but doesn't think that this is getting any worse.  Says if he drinks something late at night, he gets up about 3-4 times in the night to urinate.  Friend Unexpectedly Died Two Days Ago Notes that one of his best friends had a massive heart attack on his recliner this past 16-May-2022 and died, age 46.    Immunization History  Administered Date(s) Administered  . Influenza,inj,Quad PF,6+ Mos 11/13/2016  . Influenza-Unspecified 10/27/2011, 11/25/2015, 11/13/2016  . Tdap 10/27/2011  . Zoster Recombinat (Shingrix) 04/08/2017, 06/08/2017    Health Maintenance  Topic Date Due  . INFLUENZA VACCINE  10/20/2018 (Originally 08/19/2017)  . HIV Screening  08/12/2028 (Originally 09/05/1975)  . COLONOSCOPY  11/12/2020  . TETANUS/TDAP  10/26/2021  . Hepatitis C Screening  Completed      Wt Readings  from Last 3 Encounters:  02/01/18 201 lb 12.8 oz (91.5 kg)  09/27/17 196 lb (88.9 kg)  06/08/17 208 lb 14.4 oz (94.8 kg)   BP Readings from Last 3 Encounters:  02/01/18 129/88  09/27/17 132/78  06/08/17 127/84   Pulse Readings from Last 3 Encounters:  02/01/18 68  09/27/17 78  06/08/17 83    Patient Active Problem List   Diagnosis Date Noted  . Hypertriglyceridemia 01/07/2016    Priority: High  . HLD (hyperlipidemia) 01/07/2016    Priority: High  . Essential hypertension 08/26/2015    Priority: High  . H/o back strain - M-SK and rad to sides/ front 01/07/2016    Priority: Medium  . Excessive drinking of alcohol 01/07/2016     Priority: Medium  . Obesity, Class I, BMI 30-34.9 01/07/2016    Priority: Medium  . h/o recent MRSA infection 08/26/2015    Priority: Medium  . Elevated PSA measurement 01/07/2016    Priority: Low  . Decreased libido 01/07/2016    Priority: Low  . ED (erectile dysfunction) 01/07/2016    Priority: Low  . Glucose intolerance (impaired glucose tolerance) 06/08/2017  . High risk medication use-  Acc[facility]ane from [facility] 06/08/2017  . Vitamin D deficiency 06/08/2017  . Chronic periumbilical pain with hernia 12/14/2016  . Irreducible umbilical hernia 23/55/7322  . Callus of foot 12/14/2016  . Elevated fasting blood sugar- 08/2016 w derm labs 12/14/2016  . Elevated LFTs 08/12/2016  . Myalgia due to statin 01/07/2016    Past Medical History:  Diagnosis Date  . Hypertension   . MRSA cellulitis     Past Surgical History:  Procedure Laterality Date  . INSERTION OF MESH N/A 02/23/2017   Procedure: INSERTION OF MESH;  Surgeon: Ralene Ok, MD;  Location: [place];  Service: General;  Laterality: N/A;  . ROTATOR CUFF REPAIR Left   . UMBILICAL HERNIA REPAIR N/A 02/23/2017   Procedure: LAPAROSCOPIC UMBILICAL HERNIA REPAIR WITH MESH;  Surgeon: Ralene Ok, MD;  Location: [facility] OR;  Service: General;  Laterality: N/A;    Family History  Problem Relation Age of Onset  . Cancer Mother        Ovarian  . Depression Mother   . Hypertension Mother   . Cancer Father        Colon  . Diabetes Paternal Grandfather     Social History   Substance and Sexual Activity  Drug Use No  ,  Social History   Substance and Sexual Activity  Alcohol Use Yes   Comment: 2-3 a day  ,  Social History   Tobacco Use  Smoking Status Never Smoker  Smokeless Tobacco Never Used  ,  Social History   Substance and Sexual Activity  Sexual Activity Yes    Patient's Medications  New Prescriptions   No medications on file  Previous Medications   AMLODIPINE (NORVASC) 10 MG TABLET    TAKE 1 TABLET BY MOUTH  DAILY.   BIOTIN 5000 MCG TABS    Take 1 tablet by mouth daily.   CETIRIZINE (ZYRTEC) 10 MG TABLET    Take 10 mg by mouth daily as needed for allergies.   CYANOCOBALAMIN (B-12) 500 MCG TABS    Take 1 tablet by mouth daily.   CYCLOBENZAPRINE (FLEXERIL) 10 MG TABLET    Take 1 tablet (10 mg total) by mouth 3 (three) times daily as needed for muscle spasms.   EZETIMIBE (ZETIA) 10 MG TABLET    TAKE 1 TABLET BY MOUTH AT  BEDTIME.   FAMOTIDINE-CA CARB-MAG HYDROX (PEPCID COMPLETE PO)    Take 1 tablet by mouth daily as needed (acid reflux).   ISOTRETINOIN (CLARAVIS) 40 MG CAPSULE    Take 40 mg by mouth 2 (two) times daily.   MULTIPLE VITAMIN (MULTIVITAMIN WITH MINERALS) TABS TABLET    Take 1 tablet by mouth daily.   NAPHAZOLINE-PHENIRAMINE (OPCON-A) 0.027-0.315 % SOLN    Apply 1 drop to eye daily as needed (dry eyes).   NAPROXEN SODIUM (ANAPROX) 220 MG TABLET    Take 220 mg by mouth daily as needed (pain).    OMEGA-3 ACID ETHYL ESTERS (LOVAZA) 1 G CAPSULE    Take 2 capsules (2 g total) by mouth 2 (two) times daily.   SILDENAFIL (REVATIO) 20 MG TABLET    Take 60 mg by mouth daily as needed (erectile dysfunction).   TRAMADOL (ULTRAM) 50 MG TABLET    Take 1 tablet (50 mg total) by mouth every 6 (six) hours as needed.   VALSARTAN-HYDROCHLOROTHIAZIDE (DIOVAN-HCT) 320-25 MG TABLET    TAKE 1 TABLET BY MOUTH DAILY.  Modified Medications   No medications on file  Discontinued Medications   No medications on file    Flomax [tamsulosin hcl]  Review of Systems: General:   Denies fever, chills, unexplained weight loss.  Optho/Auditory:   Denies visual changes, blurred vision/LOV Respiratory:   Denies SOB, DOE more than baseline levels.  Cardiovascular:   Denies chest pain, palpitations, new onset peripheral edema  Gastrointestinal:   Denies nausea, vomiting, diarrhea.  Genitourinary: Denies dysuria, freq/ urgency, flank pain or discharge from genitals.  Endocrine:     Denies hot or cold intolerance, polyuria,  polydipsia. Musculoskeletal:   Denies unexplained myalgias, joint swelling, unexplained arthralgias, gait problems.  Skin:  Denies rash, suspicious lesions Neurological:     Denies dizziness, unexplained weakness, numbness  Psychiatric/Behavioral:   Denies mood changes, suicidal or homicidal ideations, hallucinations    Objective:     Blood pressure 129/88, pulse 68, temperature 98.3 F (36.8 C), height 5\' 10"  (1.778 m), weight 201 lb 12.8 oz (91.5 kg), SpO2 99 %. Body mass index is 28.96 kg/m. General Appearance:    Alert, cooperative, no distress, appears stated age  Head:    Normocephalic, without obvious abnormality, atraumatic  Eyes:    PERRL, conjunctiva/corneas clear, EOM's intact, fundi    benign, both eyes  Ears:    Normal TM's and external ear canals, both ears  Nose:   Nares normal, septum midline, mucosa normal, no drainage    or sinus tenderness  Throat:   Lips w/o lesion, mucosa moist, and tongue normal; teeth and   gums normal  Neck:   Supple, symmetrical, trachea midline, no adenopathy;    thyroid:  no enlargement/tenderness/nodules; no carotid   bruit or JVD  Back:     Symmetric, no curvature, ROM normal, no CVA tenderness  Lungs:     Clear to auscultation bilaterally, respirations unlabored, no       Wh/ R/ R  Chest Wall:    No tenderness or gross deformity; normal excursion   Heart:    Regular rate and rhythm, S1 and S2 normal, no murmur, rub   or gallop  Abdomen:     Soft, non-tender, bowel sounds active all four quadrants, NO   G/R/R, no masses, no organomegaly  Genitalia:   Ext genitalia: without lesion, no penile rash or discharge, no hernias appreciated   Rectal:   Two to three small non-thrombosed external hemorrhoids, not  bleeding, not inflamed.  Normal tone, prostate slightly enlarged, smooth and equal b/l, no tenderness; guaiac negative stool  Extremities:   Extremities normal, atraumatic, no cyanosis or gross edema  Pulses:   2+ and symmetric all  extremities  Skin:   Warm, dry, Skin color, texture, turgor normal, no obvious rashes or lesions  M-Sk:   Ambulates * 4 w/o difficulty, no gross deformities, tone WNL  Neurologic:   CNII-XII intact, normal strength, sensation and reflexes    Throughout Psych:  No HI/SI, judgement and insight good, Euthymic mood. Full Affect.

## 2018-02-01 NOTE — Patient Instructions (Signed)
Preventive Care for Adults, Male A healthy lifestyle and preventive care can promote health and wellness. Preventive health guidelines for men include the following key practices:  A routine yearly physical is a good way to check with your health care provider about your health and preventative screening. It is a chance to share any concerns and updates on your health and to receive a thorough exam.  Visit your dentist for a routine exam and preventative care every 6 months. Brush your teeth twice a day and floss once a day. Good oral hygiene prevents tooth decay and gum disease.  The frequency of eye exams is based on your age, health, family medical history, use of contact lenses, and other factors. Follow your health care provider's recommendations for frequency of eye exams.  Eat a healthy diet. Foods such as vegetables, fruits, whole grains, low-fat dairy products, and lean protein foods contain the nutrients you need without too many calories. Decrease your intake of foods high in solid fats, added sugars, and salt. Eat the right amount of calories for you.Get information about a proper diet from your health care provider, if necessary.  Regular physical exercise is one of the most important things you can do for your health. Most adults should get at least 150 minutes of moderate-intensity exercise (any activity that increases your heart rate and causes you to sweat) each week. In addition, most adults need muscle-strengthening exercises on 2 or more days a week.  Maintain a healthy weight. The body mass index (BMI) is a screening tool to identify possible weight problems. It provides an estimate of body fat based on height and weight. Your health care provider can find your BMI and can help you achieve or maintain a healthy weight.For adults 20 years and older:  A BMI below 18.5 is considered underweight.  A BMI of 18.5 to 24.9 is normal.  A BMI of 25 to 29.9 is considered  overweight.  A BMI of 30 and above is considered obese.  Maintain normal blood lipids and cholesterol levels by exercising and minimizing your intake of saturated fat. Eat a balanced diet with plenty of fruit and vegetables. Blood tests for lipids and cholesterol should begin at age 20 and be repeated every 5 years. If your lipid or cholesterol levels are high, you are over 50, or you are at high risk for heart disease, you may need your cholesterol levels checked more frequently.Ongoing high lipid and cholesterol levels should be treated with medicines if diet and exercise are not working.  If you smoke, find out from your health care provider how to quit. If you do not use tobacco, do not start.  Lung cancer screening is recommended for adults aged 55-80 years who are at high risk for developing lung cancer because of a history of smoking. A yearly low-dose CT scan of the lungs is recommended for people who have at least a 30-pack-year history of smoking and are a current smoker or have quit within the past 15 years. A pack year of smoking is smoking an average of 1 pack of cigarettes a day for 1 year (for example: 1 pack a day for 30 years or 2 packs a day for 15 years). Yearly screening should continue until the smoker has stopped smoking for at least 15 years. Yearly screening should be stopped for people who develop a health problem that would prevent them from having lung cancer treatment.  If you choose to drink alcohol, do not have more   than 2 drinks per day. One drink is considered to be 12 ounces (355 mL) of beer, 5 ounces (148 mL) of wine, or 1.5 ounces (44 mL) of liquor.  Avoid use of street drugs. Do not share needles with anyone. Ask for help if you need support or instructions about stopping the use of drugs.  High blood pressure causes heart disease and increases the risk of stroke. Your blood pressure should be checked at least every 1-2 years. Ongoing high blood pressure should be  treated with medicines, if weight loss and exercise are not effective.  If you are 45-79 years old, ask your health care provider if you should take aspirin to prevent heart disease.  Diabetes screening is done by taking a blood sample to check your blood glucose level after you have not eaten for a certain period of time (fasting). If you are not overweight and you do not have risk factors for diabetes, you should be screened once every 3 years starting at age 45. If you are overweight or obese and you are 40-70 years of age, you should be screened for diabetes every year as part of your cardiovascular risk assessment.  Colorectal cancer can be detected and often prevented. Most routine colorectal cancer screening begins at the age of 50 and continues through age 75. However, your health care provider may recommend screening at an earlier age if you have risk factors for colon cancer. On a yearly basis, your health care provider may provide home test kits to check for hidden blood in the stool. Use of a small camera at the end of a tube to directly examine the colon (sigmoidoscopy or colonoscopy) can detect the earliest forms of colorectal cancer. Talk to your health care provider about this at age 50, when routine screening begins. Direct exam of the colon should be repeated every 5-10 years through age 75, unless early forms of precancerous polyps or small growths are found.  People who are at an increased risk for hepatitis B should be screened for this virus. You are considered at high risk for hepatitis B if:  You were born in a country where hepatitis B occurs often. Talk with your health care provider about which countries are considered high risk.  Your parents were born in a high-risk country and you have not received a shot to protect against hepatitis B (hepatitis B vaccine).  You have HIV or AIDS.  You use needles to inject street drugs.  You live with, or have sex with, someone who  has hepatitis B.  You are a man who has sex with other men (MSM).  You get hemodialysis treatment.  You take certain medicines for conditions such as cancer, organ transplantation, and autoimmune conditions.  Hepatitis C blood testing is recommended for all people born from 1945 through 1965 and any individual with known risks for hepatitis C.  Practice safe sex. Use condoms and avoid high-risk sexual practices to reduce the spread of sexually transmitted infections (STIs). STIs include gonorrhea, chlamydia, syphilis, trichomonas, herpes, HPV, and human immunodeficiency virus (HIV). Herpes, HIV, and HPV are viral illnesses that have no cure. They can result in disability, cancer, and death.  If you are a man who has sex with other men, you should be screened at least once per year for:  HIV.  Urethral, rectal, and pharyngeal infection of gonorrhea, chlamydia, or both.  If you are at risk of being infected with HIV, it is recommended that you take a   prescription medicine daily to prevent HIV infection. This is called preexposure prophylaxis (PrEP). You are considered at risk if:  You are a man who has sex with other men (MSM) and have other risk factors.  You are a heterosexual man, are sexually active, and are at increased risk for HIV infection.  You take drugs by injection.  You are sexually active with a partner who has HIV.  Talk with your health care provider about whether you are at high risk of being infected with HIV. If you choose to begin PrEP, you should first be tested for HIV. You should then be tested every 3 months for as long as you are taking PrEP.  A one-time screening for abdominal aortic aneurysm (AAA) and surgical repair of large AAAs by ultrasound are recommended for men ages 65 to 75 years who are current or former smokers.  Healthy men should no longer receive prostate-specific antigen (PSA) blood tests as part of routine cancer screening. Talk with your health  care provider about prostate cancer screening.  Testicular cancer screening is not recommended for adult males who have no symptoms. Screening includes self-exam, a health care provider exam, and other screening tests. Consult with your health care provider about any symptoms you have or any concerns you have about testicular cancer.  Use sunscreen. Apply sunscreen liberally and repeatedly throughout the day. You should seek shade when your shadow is shorter than you. Protect yourself by wearing long sleeves, pants, a wide-brimmed hat, and sunglasses year round, whenever you are outdoors.  Once a month, do a whole-body skin exam, using a mirror to look at the skin on your back. Tell your health care provider about new moles, moles that have irregular borders, moles that are larger than a pencil eraser, or moles that have changed in shape or color.  Stay current with required vaccines (immunizations).  Influenza vaccine. All adults should be immunized every year.  Tetanus, diphtheria, and acellular pertussis (Td, Tdap) vaccine. An adult who has not previously received Tdap or who does not know his vaccine status should receive 1 dose of Tdap. This initial dose should be followed by tetanus and diphtheria toxoids (Td) booster doses every 10 years. Adults with an unknown or incomplete history of completing a 3-dose immunization series with Td-containing vaccines should begin or complete a primary immunization series including a Tdap dose. Adults should receive a Td booster every 10 years.  Varicella vaccine. An adult without evidence of immunity to varicella should receive 2 doses or a second dose if he has previously received 1 dose.  Human papillomavirus (HPV) vaccine. Males aged 11-21 years who have not received the vaccine previously should receive the 3-dose series. Males aged 22-26 years may be immunized. Immunization is recommended through the age of 26 years for any male who has sex with males  and did not get any or all doses earlier. Immunization is recommended for any person with an immunocompromised condition through the age of 26 years if he did not get any or all doses earlier. During the 3-dose series, the second dose should be obtained 4-8 weeks after the first dose. The third dose should be obtained 24 weeks after the first dose and 16 weeks after the second dose.  Zoster vaccine. One dose is recommended for adults aged 60 years or older unless certain conditions are present.  Measles, mumps, and rubella (MMR) vaccine. Adults born before 1957 generally are considered immune to measles and mumps. Adults born in 1957   or later should have 1 or more doses of MMR vaccine unless there is a contraindication to the vaccine or there is laboratory evidence of immunity to each of the three diseases. A routine second dose of MMR vaccine should be obtained at least 28 days after the first dose for students attending postsecondary schools, health care workers, or international travelers. People who received inactivated measles vaccine or an unknown type of measles vaccine during 1963-1967 should receive 2 doses of MMR vaccine. People who received inactivated mumps vaccine or an unknown type of mumps vaccine before 1979 and are at high risk for mumps infection should consider immunization with 2 doses of MMR vaccine. Unvaccinated health care workers born before 1957 who lack laboratory evidence of measles, mumps, or rubella immunity or laboratory confirmation of disease should consider measles and mumps immunization with 2 doses of MMR vaccine or rubella immunization with 1 dose of MMR vaccine.  Pneumococcal 13-valent conjugate (PCV13) vaccine. When indicated, a person who is uncertain of his immunization history and has no record of immunization should receive the PCV13 vaccine. All adults 65 years of age and older should receive this vaccine. An adult aged 19 years or older who has certain medical  conditions and has not been previously immunized should receive 1 dose of PCV13 vaccine. This PCV13 should be followed with a dose of pneumococcal polysaccharide (PPSV23) vaccine. Adults who are at high risk for pneumococcal disease should obtain the PPSV23 vaccine at least 8 weeks after the dose of PCV13 vaccine. Adults older than 58 years of age who have normal immune system function should obtain the PPSV23 vaccine dose at least 1 year after the dose of PCV13 vaccine.  Pneumococcal polysaccharide (PPSV23) vaccine. When PCV13 is also indicated, PCV13 should be obtained first. All adults aged 65 years and older should be immunized. An adult younger than age 65 years who has certain medical conditions should be immunized. Any person who resides in a nursing home or long-term care facility should be immunized. An adult smoker should be immunized. People with an immunocompromised condition and certain other conditions should receive both PCV13 and PPSV23 vaccines. People with human immunodeficiency virus (HIV) infection should be immunized as soon as possible after diagnosis. Immunization during chemotherapy or radiation therapy should be avoided. Routine use of PPSV23 vaccine is not recommended for American Indians, [place] Natives, or people younger than 65 years unless there are medical conditions that require PPSV23 vaccine. When indicated, people who have unknown immunization and have no record of immunization should receive PPSV23 vaccine. One-time revaccination 5 years after the first dose of PPSV23 is recommended for people aged 19-64 years who have chronic kidney failure, nephrotic syndrome, asplenia, or immunocompromised conditions. People who received 1-2 doses of PPSV23 before age 65 years should receive another dose of PPSV23 vaccine at age 65 years or later if at least 5 years have passed since the previous dose. Doses of PPSV23 are not needed for people immunized with PPSV23 at or after age 65  years.  Meningococcal vaccine. Adults with asplenia or persistent complement component deficiencies should receive 2 doses of quadrivalent meningococcal conjugate (MenACWY-D) vaccine. The doses should be obtained at least 2 months apart. Microbiologists working with certain meningococcal bacteria, military recruits, people at risk during an outbreak, and people who travel to or live in countries with a high rate of meningitis should be immunized. A first-year college student up through age 21 years who is living in a residence hall should receive a   dose if he did not receive a dose on or after his 16th birthday. Adults who have certain high-risk conditions should receive one or more doses of vaccine.  Hepatitis A vaccine. Adults who wish to be protected from this disease, have chronic liver disease, work with hepatitis A-infected animals, work in hepatitis A research labs, or travel to or work in countries with a high rate of hepatitis A should be immunized. Adults who were previously unvaccinated and who anticipate close contact with an international adoptee during the first 60 days after arrival in the [place] from a country with a high rate of hepatitis A should be immunized.  Hepatitis B vaccine. Adults should be immunized if they wish to be protected from this disease, are under age 59 years and have diabetes, have chronic liver disease, have had more than one sex partner in the past 6 months, may be exposed to blood or other infectious body fluids, are household contacts or sex partners of hepatitis B positive people, are clients or workers in certain care facilities, or travel to or work in countries with a high rate of hepatitis B.  Haemophilus influenzae type b (Hib) vaccine. A previously unvaccinated person with asplenia or sickle cell disease or having a scheduled splenectomy should receive 1 dose of Hib vaccine. Regardless of previous immunization, a recipient of a hematopoietic stem cell  transplant should receive a 3-dose series 6-12 months after his successful transplant. Hib vaccine is not recommended for adults with HIV infection. Preventive Service / Frequency Ages 19 to 39  Blood pressure check.** / Every 3-5 years.  Lipid and cholesterol check.** / Every 5 years beginning at age 20.  Hepatitis C blood test.** / For any individual with known risks for hepatitis C.  Skin self-exam. / Monthly.  Influenza vaccine. / Every year.  Tetanus, diphtheria, and acellular pertussis (Tdap, Td) vaccine.** / Consult your health care provider. 1 dose of Td every 10 years.  Varicella vaccine.** / Consult your health care provider.  HPV vaccine. / 3 doses over 6 months, if 26 or younger.  Measles, mumps, rubella (MMR) vaccine.** / You need at least 1 dose of MMR if you were born in 1957 or later. You may also need a second dose.  Pneumococcal 13-valent conjugate (PCV13) vaccine.** / Consult your health care provider.  Pneumococcal polysaccharide (PPSV23) vaccine.** / 1 to 2 doses if you smoke cigarettes or if you have certain conditions.  Meningococcal vaccine.** / 1 dose if you are age 19 to 21 years and a first-year college student living in a residence hall, or have one of several medical conditions. You may also need additional booster doses.  Hepatitis A vaccine.** / Consult your health care provider.  Hepatitis B vaccine.** / Consult your health care provider.  Haemophilus influenzae type b (Hib) vaccine.** / Consult your health care provider. Ages 40 to 64  Blood pressure check.** / Every year.  Lipid and cholesterol check.** / Every 5 years beginning at age 20.  Lung cancer screening. / Every year if you are aged 55-80 years and have a 30-pack-year history of smoking and currently smoke or have quit within the past 15 years. Yearly screening is stopped once you have quit smoking for at least 15 years or develop a health problem that would prevent you from having  lung cancer treatment.  Fecal occult blood test (FOBT) of stool. / Every year beginning at age 50 and continuing until age 75. You may not have to do   this test if you get a colonoscopy every 10 years.  Flexible sigmoidoscopy** or colonoscopy.** / Every 5 years for a flexible sigmoidoscopy or every 10 years for a colonoscopy beginning at age 50 and continuing until age 75.  Hepatitis C blood test.** / For all people born from 1945 through 1965 and any individual with known risks for hepatitis C.  Skin self-exam. / Monthly.  Influenza vaccine. / Every year.  Tetanus, diphtheria, and acellular pertussis (Tdap/Td) vaccine.** / Consult your health care provider. 1 dose of Td every 10 years.  Varicella vaccine.** / Consult your health care provider.  Zoster vaccine.** / 1 dose for adults aged 60 years or older.  Measles, mumps, rubella (MMR) vaccine.** / You need at least 1 dose of MMR if you were born in 1957 or later. You may also need a second dose.  Pneumococcal 13-valent conjugate (PCV13) vaccine.** / Consult your health care provider.  Pneumococcal polysaccharide (PPSV23) vaccine.** / 1 to 2 doses if you smoke cigarettes or if you have certain conditions.  Meningococcal vaccine.** / Consult your health care provider.  Hepatitis A vaccine.** / Consult your health care provider.  Hepatitis B vaccine.** / Consult your health care provider.  Haemophilus influenzae type b (Hib) vaccine.** / Consult your health care provider. Ages 65 and over  Blood pressure check.** / Every year.  Lipid and cholesterol check.**/ Every 5 years beginning at age 20.  Lung cancer screening. / Every year if you are aged 55-80 years and have a 30-pack-year history of smoking and currently smoke or have quit within the past 15 years. Yearly screening is stopped once you have quit smoking for at least 15 years or develop a health problem that would prevent you from having lung cancer treatment.  Fecal  occult blood test (FOBT) of stool. / Every year beginning at age 50 and continuing until age 75. You may not have to do this test if you get a colonoscopy every 10 years.  Flexible sigmoidoscopy** or colonoscopy.** / Every 5 years for a flexible sigmoidoscopy or every 10 years for a colonoscopy beginning at age 50 and continuing until age 75.  Hepatitis C blood test.** / For all people born from 1945 through 1965 and any individual with known risks for hepatitis C.  Abdominal aortic aneurysm (AAA) screening.** / A one-time screening for ages 65 to 75 years who are current or former smokers.  Skin self-exam. / Monthly.  Influenza vaccine. / Every year.  Tetanus, diphtheria, and acellular pertussis (Tdap/Td) vaccine.** / 1 dose of Td every 10 years.  Varicella vaccine.** / Consult your health care provider.  Zoster vaccine.** / 1 dose for adults aged 60 years or older.  Pneumococcal 13-valent conjugate (PCV13) vaccine.** / 1 dose for all adults aged 65 years and older.  Pneumococcal polysaccharide (PPSV23) vaccine.** / 1 dose for all adults aged 65 years and older.  Meningococcal vaccine.** / Consult your health care provider.  Hepatitis A vaccine.** / Consult your health care provider.  Hepatitis B vaccine.** / Consult your health care provider.  Haemophilus influenzae type b (Hib) vaccine.** / Consult your health care provider. **Family history and personal history of risk and conditions may change your health care provider's recommendations.   This information is not intended to replace advice given to you by your health care provider. Make sure you discuss any questions you have with your health care provider.   Document Released: 03/03/2001 Document Revised: 01/26/2014 Document Reviewed: 06/02/2010 [employer] Interactive Patient Education 2016   [employer]  

## 2018-02-02 LAB — HEMOGLOBIN A1C
Est. average glucose Bld gHb Est-mCnc: 105 mg/dL
Hgb A1c MFr Bld: 5.3 % (ref 4.8–5.6)

## 2018-02-02 LAB — LIPID PANEL
CHOL/HDL RATIO: 2.6 ratio (ref 0.0–5.0)
Cholesterol, Total: 166 mg/dL (ref 100–199)
HDL: 64 mg/dL (ref >39)
LDL CALC: 91 mg/dL (ref 0–99)
TRIGLYCERIDES: 55 mg/dL (ref 0–149)
VLDL CHOLESTEROL CAL: 11 mg/dL (ref 5–40)

## 2018-02-02 LAB — CBC WITH DIFFERENTIAL/PLATELET
BASOS ABS: 0.1 10*3/uL (ref 0.0–0.2)
Basos: 2 %
EOS (ABSOLUTE): 0.1 10*3/uL (ref 0.0–0.4)
EOS: 4 %
HEMATOCRIT: 39.7 % (ref 37.5–51.0)
HEMOGLOBIN: 14.3 g/dL (ref 13.0–17.7)
IMMATURE GRANS (ABS): 0 10*3/uL (ref 0.0–0.1)
Immature Granulocytes: 0 %
LYMPHS ABS: 1.3 10*3/uL (ref 0.7–3.1)
LYMPHS: 35 %
MCH: 33.4 pg — ABNORMAL HIGH (ref 26.6–33.0)
MCHC: 36 g/dL — AB (ref 31.5–35.7)
MCV: 93 fL (ref 79–97)
MONOCYTES: 18 %
Monocytes Absolute: 0.7 10*3/uL (ref 0.1–0.9)
NEUTROS ABS: 1.5 10*3/uL (ref 1.4–7.0)
Neutrophils: 41 %
Platelets: 324 10*3/uL (ref 150–450)
RBC: 4.28 x10E6/uL (ref 4.14–5.80)
RDW: 12.1 % (ref 11.6–15.4)
WBC: 3.6 10*3/uL (ref 3.4–10.8)

## 2018-02-02 LAB — COMPREHENSIVE METABOLIC PANEL
ALBUMIN: 4.6 g/dL (ref 3.5–5.5)
ALK PHOS: 74 IU/L (ref 39–117)
ALT: 120 IU/L — ABNORMAL HIGH (ref 0–44)
AST: 88 IU/L — ABNORMAL HIGH (ref 0–40)
Albumin/Globulin Ratio: 2.3 — ABNORMAL HIGH (ref 1.2–2.2)
BUN / CREAT RATIO: 15 (ref 9–20)
BUN: 15 mg/dL (ref 6–24)
Bilirubin Total: 0.8 mg/dL (ref 0.0–1.2)
CO2: 24 mmol/L (ref 20–29)
CREATININE: 1 mg/dL (ref 0.76–1.27)
Calcium: 9.6 mg/dL (ref 8.7–10.2)
Chloride: 95 mmol/L — ABNORMAL LOW (ref 96–106)
GFR, EST AFRICAN AMERICAN: 96 mL/min/{1.73_m2} (ref >59)
GFR, EST NON AFRICAN AMERICAN: 83 mL/min/{1.73_m2} (ref >59)
GLOBULIN, TOTAL: 2 g/dL (ref 1.5–4.5)
GLUCOSE: 112 mg/dL — AB (ref 65–99)
Potassium: 4.3 mmol/L (ref 3.5–5.2)
SODIUM: 134 mmol/L (ref 134–144)
TOTAL PROTEIN: 6.6 g/dL (ref 6.0–8.5)

## 2018-02-02 LAB — PSA: Prostate Specific Ag, Serum: 3 ng/mL (ref 0.0–4.0)

## 2018-02-02 LAB — TSH: TSH: 1.34 u[IU]/mL (ref 0.450–4.500)

## 2018-02-02 LAB — VITAMIN D 25 HYDROXY (VIT D DEFICIENCY, FRACTURES): Vit D, 25-Hydroxy: 67.4 ng/mL (ref 30.0–100.0)

## 2018-02-02 LAB — T4, FREE: FREE T4: 1.47 ng/dL (ref 0.82–1.77)

## 2018-02-02 LAB — HIV ANTIBODY (ROUTINE TESTING W REFLEX): HIV Screen 4th Generation wRfx: NONREACTIVE

## 2018-04-09 ENCOUNTER — Other Ambulatory Visit: Payer: Self-pay | Admitting: Family Medicine

## 2018-04-09 DIAGNOSIS — E781 Pure hyperglyceridemia: Secondary | ICD-10-CM

## 2018-04-09 DIAGNOSIS — E782 Mixed hyperlipidemia: Secondary | ICD-10-CM

## 2018-05-04 ENCOUNTER — Other Ambulatory Visit: Payer: Self-pay | Admitting: Family Medicine

## 2018-05-04 DIAGNOSIS — I10 Essential (primary) hypertension: Secondary | ICD-10-CM

## 2018-06-01 ENCOUNTER — Ambulatory Visit (INDEPENDENT_AMBULATORY_CARE_PROVIDER_SITE_OTHER): Payer: BLUE CROSS/BLUE SHIELD | Admitting: Family Medicine

## 2018-06-01 ENCOUNTER — Encounter: Payer: Self-pay | Admitting: Family Medicine

## 2018-06-01 ENCOUNTER — Other Ambulatory Visit: Payer: Self-pay

## 2018-06-01 VITALS — BP 140/90 | HR 75 | Temp 97.8°F | Ht 70.0 in | Wt 204.0 lb

## 2018-06-01 DIAGNOSIS — N521 Erectile dysfunction due to diseases classified elsewhere: Secondary | ICD-10-CM

## 2018-06-01 DIAGNOSIS — E781 Pure hyperglyceridemia: Secondary | ICD-10-CM

## 2018-06-01 DIAGNOSIS — F1010 Alcohol abuse, uncomplicated: Secondary | ICD-10-CM | POA: Diagnosis not present

## 2018-06-01 DIAGNOSIS — F4322 Adjustment disorder with anxiety: Secondary | ICD-10-CM | POA: Insufficient documentation

## 2018-06-01 DIAGNOSIS — I10 Essential (primary) hypertension: Secondary | ICD-10-CM

## 2018-06-01 DIAGNOSIS — R740 Nonspecific elevation of levels of transaminase and lactic acid dehydrogenase [LDH]: Secondary | ICD-10-CM

## 2018-06-01 DIAGNOSIS — E782 Mixed hyperlipidemia: Secondary | ICD-10-CM

## 2018-06-01 DIAGNOSIS — R7401 Elevation of levels of liver transaminase levels: Secondary | ICD-10-CM | POA: Insufficient documentation

## 2018-06-01 MED ORDER — BUSPIRONE HCL 10 MG PO TABS: 5.0000 mg | ORAL_TABLET | Freq: Three times a day (TID) | ORAL | 5 refills | Status: DC

## 2018-06-01 MED ORDER — FLUOXETINE HCL 10 MG PO TABS: ORAL_TABLET | ORAL | 1 refills | Status: DC

## 2018-06-01 MED ORDER — AMLODIPINE BESYLATE 10 MG PO TABS: 5.0000 mg | ORAL_TABLET | Freq: Two times a day (BID) | ORAL | 1 refills | Status: DC

## 2018-06-01 MED ORDER — SILDENAFIL CITRATE 20 MG PO TABS: 60.0000 mg | ORAL_TABLET | Freq: Every day | ORAL | 1 refills | Status: DC | PRN

## 2018-06-01 MED ORDER — OMEGA-3-ACID ETHYL ESTERS 1 G PO CAPS: 2.0000 g | ORAL_CAPSULE | Freq: Two times a day (BID) | ORAL | 3 refills | Status: DC

## 2018-06-01 NOTE — Progress Notes (Signed)
Virtual / live video office visit note for [employer], D.O- Primary Care Physician at [facility]   I connected with current patient today and beyond visually recognizing the correct individual, I verified that I am speaking with the correct person using two identifiers.  . Location of the patient: Home . Location of the provider: Office Only the patient (+/- their family members at pt's discretion) and myself were participating in the encounter    - This visit type was conducted due to national recommendations for restrictions regarding the COVID-19 Pandemic (e.g. social distancing) in an effort to limit this patient's exposure and mitigate transmission in our community.  This format is felt to be most appropriate for this patient at this time.   - The patient did have access to video technology today     - No physical exam could be performed with this format, beyond that communicated to [place] by the patient/ family members as noted.   - Additionally my office staff/ schedulers discussed with the patient that there may be a monetary charge related to this service, depending on patient's medical insurance.   The patient expressed understanding, and agreed to proceed.      History of Present Illness:  Acute complaints: Worried sick all the time from pandemic- stressed badly last several months.  Last 2 wks have been a little better.   - Waking up in middle of the night cold sweats from bad dreams/ severe anxiety.   Had some URI sx- and at time diff to breath- had a lot of panic as he thought he had corona virus.  Depression screen [facility] 2/9 06/01/2018 02/01/2018 09/27/2017 06/08/2017 04/13/2017  Decreased Interest 0 1 0 0 0  Down, Depressed, Hopeless 1 0 0 1 0  PHQ - 2 Score 1 1 0 1 0  Altered sleeping 2 1 1 1 1   Tired, decreased energy 0 1 0 1 1  Change in appetite 0 0 0 0 1  Feeling bad or failure about yourself  0 0 0 0 0  Trouble concentrating 2 0 0 1 0  Moving slowly or  fidgety/restless 0 0 0 0 0  Suicidal thoughts 0 0 0 0 0  PHQ-9 Score 5 3 1 4 3   Difficult doing work/chores Somewhat difficult - Not difficult at all Not difficult at all Not difficult at all    GAD 7 : Generalized Anxiety Score 06/01/2018  Nervous, Anxious, on Edge 1  Control/stop worrying 1  Worry too much - different things 1  Trouble relaxing 1  Restless 0  Easily annoyed or irritable 0  Afraid - awful might happen 1  Total GAD 7 Score 5  Anxiety Difficulty Very difficult  - Still having 5-6 Truly's per day.  Has not cut back on alcohol despite very high elevation in ALT around 120's and AST in the 80s last office visit in January, and our recommendations.  Is using alcohol to cope with his current stress levels -Patient golfs 5 days a week and walks the course.  He does this with his wife. -Pt was on wellbutrin in past- worked well but he had no libido. This was decades ago when he was having a lot of stress family stress.   Ave BP: 120's/ 80 or so, checks BID most days- well controlled in general.  However patient states when he takes his medicine in the morning, his blood pressure tends to be too low and he finds  if he takes it at night it is better controlled and he does not have lows.  However, when patient awakes it usually in 140s over 90s-not well controlled.   ED: - uses them once every couple weeks. No s-es at all, works well for pt, he has not seen in c BP with taking them.  Patient had gotten them from urology in the past but requests we provide that to him now so he does not have to continually go up to [facility] to get them.   CHOL HPI/ elevated transaminases:  -  He  is currently managed with: Zetia and Lovaza.  Txmnt compliance- good-however Lovaza was too expensive so patient is using over-the-counter fish oil at this time.  Patient reports very little compliance with low chol/ saturated and trans fat diet.  Does exercise with golfing 5 days a week or more and  walking the entire course.  RUQ pain-denies   Muscle aches-denies  No other s-e  Last lipid panel as follows:  Lab Results  Component Value Date   CHOL 166 02/01/2018   HDL 64 02/01/2018   LDLCALC 91 02/01/2018   TRIG 55 02/01/2018   CHOLHDL 2.6 02/01/2018    Hepatic Function Latest Ref Rng & Units 02/01/2018 06/04/2017 01/25/2017  Total Protein 6.0 - 8.5 g/dL 6.6 7.0 6.9  Albumin 3.5 - 5.5 g/dL 4.6 4.3 4.3  AST 0 - 40 IU/L 88(H) 73(H) 55(H)  ALT 0 - 44 IU/L 120(H) 78(H) 71(H)  Alk Phosphatase 39 - 117 IU/L 74 93 73  Total Bilirubin 0.0 - 1.2 mg/dL 0.8 0.5 0.5  Bilirubin, Direct 0.00 - 0.40 mg/dL - - -        Impression and Recommendations:    1. Essential hypertension   2. Mixed hyperlipidemia   3. Hypertriglyceridemia   4. Excessive drinking of alcohol   5. Transaminitis   6. Glucose intolerance (impaired glucose tolerance)   7. Vitamin D deficiency   8. Adjustment disorder with anxious mood   9. Erectile dysfunction due to diseases classified elsewhere     -Since patient feels his blood pressure goes too low when he takes his meds in the morning, he will take his valsartan and medication at night as he has been doing but he will cut the amlodipine in half and take half in the morning and half in the evening in hopes this will level out his blood pressure throughout the day.  No new medications were given but the dose change of how to take the medicine on the amlodipine was modified.  Patient will check his blood pressure twice daily and let us know how this change did for him  -For his hyperlipidemia and hypertriglyceridemia: We will try Lovaza prescription as in the past his insurance would not pay for it but we will try that again.  After starting medications, he had significant improvement in his HDL which was 42 up to in the 70s and 60s and also triglycerides went into normal ranges and LDL less than 100.  Continue medications for now.  Just recently checked in  January  -For alcohol excess and elevated liver enzymes:  -Explained to patient the importance of abstinence and or significant decrease in alcohol intake.  He understands this is a detriment to his liver and causing alcoholic hepatitis.  He declined to get a repeat liver enzyme today and states in 1 month he will work on really decreasing his alcohol intake and we can repeat labs then.  CMP 1 month-just a few days prior to follow-up OV.  - pt needs repeat CMP in one month- he will really work on dec ETOH as we give him Prozac and buspar for mood.  -For new onset of mood disorder/anxiety predominance:  -Patient declined going to a Social worker.   We will start Prozac at low dose and also BuSpar.  In the past SNRI caused decreased libido and hopefully, Prozac will get in his system quickly and we will start BuSpar as well so that if the SSRI causes sexual dysfunction, BuSpar will be in him and hopefully we will just be able to wean off the Prozac and continue the BuSpar at a higher dose if this happens.  Risk benefits of meds discussed with patient in detail.  All questions were answered.  Reminded him it is not just about medications but also diet lifestyle, prayer or meditation, breathing techniques, healthy diet, etc.  -For erectile dysfunction, patient was seen by urology prior.  He was evaluated and was found that sildenafil was appropriate.  Patient asked if we could refill the medicine for him.  Reminded of increased blood pressure, risk of priapism etc., patient understands all risks and requests medication.  Refill was provided  - As part of my medical decision making, I reviewed the following data within the [place] History obtained from pt /family, CMA notes reviewed and incorporated if applicable, Labs reviewed, Radiograph/ tests reviewed if applicable and OV notes from prior OV's with me, as well as other specialists she/he has seen since seeing me last, were all reviewed and  used in my medical decision making process today.   - Additionally, discussion had with patient regarding txmnt plan, their biases about that plan etc were used in my medical decision making today.   - The patient agreed with the plan and demonstrated an understanding of the instructions.   No barriers to understanding were identified.   - Red flag symptoms and signs discussed in detail.  Patient expressed understanding regarding what to do in case of emergency\ urgent symptoms.  The patient was advised to call back or seek an in-person evaluation if the symptoms worsen or if the condition fails to improve as anticipated.   Return for one mo f/up - CMP prior to OV, then ov 3-5d later- since starting prozac and buspar..    No orders of the defined types were placed in this encounter.   Meds ordered this encounter  Medications  . omega-3 acid ethyl esters (LOVAZA) 1 g capsule    Sig: Take 2 capsules (2 g total) by mouth 2 (two) times daily.    Dispense:  360 capsule    Refill:  3  . FLUoxetine (PROZAC) 10 MG tablet    Sig: Use one half tablet daily for 1 week then increase to 1 tablet daily    Dispense:  90 tablet    Refill:  1  . busPIRone (BUSPAR) 10 MG tablet    Sig: Take 0.5 tablets (5 mg total) by mouth 3 (three) times daily.    Dispense:  90 tablet    Refill:  5  . sildenafil (REVATIO) 20 MG tablet    Sig: Take 3 tablets (60 mg total) by mouth daily as needed (erectile dysfunction).    Dispense:  10 tablet    Refill:  1  . amLODipine (NORVASC) 10 MG tablet    Sig: Take 0.5 tablets (5 mg total) by mouth 2 (two) times a day.  Dispense:  90 tablet    Refill:  1    Medications Discontinued During This Encounter  Medication Reason  . ISOtretinoin (CLARAVIS) 40 MG capsule Completed Course  . omega-3 acid ethyl esters (LOVAZA) 1 g capsule Reorder  . sildenafil (REVATIO) 20 MG tablet Reorder  . amLODipine (NORVASC) 10 MG tablet       I provided 35+ minutes of  non-face-to-face time during this encounter,with over 50% of the time in direct counseling on patients medical conditions/ medical concerns.  Additional time was spent with charting and coordination of care after the actual visit commenced.   Note:  This note was prepared with assistance of Dragon voice recognition software. Occasional wrong-word or sound-a-like substitutions may have occurred due to the inherent limitations of voice recognition software.  Mellody Dance, DO     Patient Care Team    Relationship Specialty Notifications Start End  Mellody Dance, DO PCP - General Family Medicine  01/07/16   Wilford Corner, MD Consulting Physician Gastroenterology  01/07/16   Warren Danes, PA-C Physician Assistant Dermatology  01/07/16   Carolan Clines, MD (Inactive) Consulting Physician Urology  01/07/16    Comment: Jennette Dubin, MD Consulting Physician Infectious Diseases  01/07/16     -Vitals obtained; medications/ allergies reconciled;  personal medical, social, Sx etc.histories were updated by CMA, reviewed by me and are reflected in chart  Patient Active Problem List   Diagnosis Date Noted  . Hypertriglyceridemia 01/07/2016    Priority: High  . HLD (hyperlipidemia) 01/07/2016    Priority: High  . Essential hypertension 08/26/2015    Priority: High  . H/o back strain - M-SK and rad to sides/ front 01/07/2016    Priority: Medium  . Excessive drinking of alcohol 01/07/2016    Priority: Medium  . Obesity, Class I, BMI 30-34.9 01/07/2016    Priority: Medium  . h/o recent MRSA infection 08/26/2015    Priority: Medium  . Elevated PSA measurement 01/07/2016    Priority: Low  . Decreased libido 01/07/2016    Priority: Low  . ED (erectile dysfunction) 01/07/2016    Priority: Low  . Transaminitis 06/01/2018  . Glucose intolerance (impaired glucose tolerance) 06/08/2017  . High risk medication use-  Accutane from [facility] 06/08/2017  . Vitamin D  deficiency 06/08/2017  . Chronic periumbilical pain with hernia 12/14/2016  . Irreducible umbilical hernia 82/42/3536  . Callus of foot 12/14/2016  . Elevated fasting blood sugar- 08/2016 w derm labs 12/14/2016  . Elevated LFTs 08/12/2016  . Myalgia due to statin 01/07/2016     Current Meds  Medication Sig  . amLODipine (NORVASC) 10 MG tablet Take 0.5 tablets (5 mg total) by mouth 2 (two) times a day.  . Biotin 5000 MCG TABS Take 1 tablet by mouth daily.  . cetirizine (ZYRTEC) 10 MG tablet Take 10 mg by mouth daily as needed for allergies.  . Cyanocobalamin (B-12) 500 MCG TABS Take 1 tablet by mouth daily.  Marland Kitchen ezetimibe (ZETIA) 10 MG tablet TAKE 1 TABLET BY MOUTH AT BEDTIME.  . Famotidine-Ca Carb-Mag Hydrox (PEPCID COMPLETE PO) Take 1 tablet by mouth daily as needed (acid reflux).  . Multiple Vitamin (MULTIVITAMIN WITH MINERALS) TABS tablet Take 1 tablet by mouth daily.  . Naphazoline-Pheniramine (OPCON-A) 0.027-0.315 % SOLN Apply 1 drop to eye daily as needed (dry eyes).  Marland Kitchen omega-3 acid ethyl esters (LOVAZA) 1 g capsule Take 2 capsules (2 g total) by mouth 2 (two) times daily.  . valsartan-hydrochlorothiazide (  DIOVAN-HCT) 320-25 MG tablet TAKE 1 TABLET BY MOUTH DAILY.  . [DISCONTINUED] amLODipine (NORVASC) 10 MG tablet TAKE 1 TABLET BY MOUTH DAILY.  . [DISCONTINUED] omega-3 acid ethyl esters (LOVAZA) 1 g capsule Take 2 capsules (2 g total) by mouth 2 (two) times daily.     Allergies  Allergen Reactions  . Flomax [Tamsulosin Hcl] Anxiety     ROS:  See above HPI for pertinent positives and negatives   Objective:   Blood pressure 140/90, pulse 75, temperature 97.8 F (36.6 C), height 5' 10"  (1.778 m), weight 204 lb (92.5 kg).  (if some vitals are omitted, this means that patient was UNABLE to obtain them even though they were asked to get them prior to OV today.  They were asked to call us at their earliest convenience with these once obtained.)  General: A & O * 3; visually in  no acute distress; in usual state of health.  Skin: Visible skin appears normal and pt's usual skin color HEENT:  EOMI, head is normocephalic and atraumatic.  Sclera are anicteric. Neck has a good range of motion.  Lips are noncyanotic Chest: normal chest excursion and movement Respiratory: speaking in full sentences, no conversational dyspnea; no use of accessory muscles Psych: insight good, mood- appears full

## 2018-07-13 ENCOUNTER — Other Ambulatory Visit: Payer: Self-pay | Admitting: Family Medicine

## 2018-07-13 ENCOUNTER — Telehealth: Payer: Self-pay

## 2018-07-13 DIAGNOSIS — E781 Pure hyperglyceridemia: Secondary | ICD-10-CM

## 2018-07-13 DIAGNOSIS — E782 Mixed hyperlipidemia: Secondary | ICD-10-CM

## 2018-07-13 NOTE — Telephone Encounter (Signed)
Insurance will not cover the [employer].  Called the patient he states that the [place] is too expensive out of pocket.  Patient is using OTC fish oil 2 caps a day - patient is not sure of the dose (he will check and let the office know) he states that he is sure that it is either 1,000 or 2,000 mg capsules. MPulliam, CMA/RT(R)

## 2018-08-30 ENCOUNTER — Telehealth: Payer: Self-pay | Admitting: Family Medicine

## 2018-08-30 NOTE — Telephone Encounter (Signed)
-----   Message from Jerilee Field, [place] sent at 07/13/2018  3:17 PM EDT ----- Patient is due for follow up, please call the patient to make an appointment.  Thanks. MPulliam, CMA/RT(R)

## 2018-08-30 NOTE — Telephone Encounter (Signed)
Noted MPulliam, CMA/RT(R)  

## 2018-08-30 NOTE — Telephone Encounter (Signed)
Called patient to set up provider required Telehealth or OV -- left message on cell#  --FYI to medical asst.  -glh

## 2018-10-29 ENCOUNTER — Other Ambulatory Visit: Payer: Self-pay | Admitting: Family Medicine

## 2018-10-29 DIAGNOSIS — E782 Mixed hyperlipidemia: Secondary | ICD-10-CM

## 2018-10-29 DIAGNOSIS — E781 Pure hyperglyceridemia: Secondary | ICD-10-CM

## 2018-12-01 ENCOUNTER — Other Ambulatory Visit: Payer: Self-pay | Admitting: Family Medicine

## 2018-12-01 DIAGNOSIS — E782 Mixed hyperlipidemia: Secondary | ICD-10-CM

## 2018-12-01 DIAGNOSIS — I10 Essential (primary) hypertension: Secondary | ICD-10-CM

## 2018-12-01 DIAGNOSIS — E781 Pure hyperglyceridemia: Secondary | ICD-10-CM

## 2018-12-07 DIAGNOSIS — Z03818 Encounter for observation for suspected exposure to other biological agents ruled out: Secondary | ICD-10-CM | POA: Diagnosis not present

## 2019-01-16 ENCOUNTER — Other Ambulatory Visit: Payer: Self-pay | Admitting: Family Medicine

## 2019-01-16 DIAGNOSIS — E782 Mixed hyperlipidemia: Secondary | ICD-10-CM

## 2019-01-16 DIAGNOSIS — E781 Pure hyperglyceridemia: Secondary | ICD-10-CM

## 2019-02-08 ENCOUNTER — Other Ambulatory Visit: Payer: Self-pay | Admitting: Family Medicine

## 2019-02-08 DIAGNOSIS — E782 Mixed hyperlipidemia: Secondary | ICD-10-CM

## 2019-02-08 DIAGNOSIS — E781 Pure hyperglyceridemia: Secondary | ICD-10-CM

## 2019-02-13 ENCOUNTER — Other Ambulatory Visit: Payer: Self-pay | Admitting: Family Medicine

## 2019-02-13 DIAGNOSIS — I10 Essential (primary) hypertension: Secondary | ICD-10-CM

## 2019-03-15 ENCOUNTER — Encounter: Payer: BLUE CROSS/BLUE SHIELD | Admitting: Family Medicine

## 2019-03-15 ENCOUNTER — Telehealth: Payer: Self-pay | Admitting: Family Medicine

## 2019-03-15 NOTE — Telephone Encounter (Signed)
Patient called stating he is out of (3) diff meds--- He had appt scheduled today 2/24 but it was cancelled due to med asst/ CMA calling out sick--- Pt's appt rescheduled for later in March but he is OUT of Meds & is requesting refill just enough to get him to the Rescheduled Date 04/18/19.  Pt needs refill on :   1)--- amLODipine (NORVASC) 10 MG tablet [id number]   Order Details Dose, Route, Frequency: As Directed  Dispense Quantity: 15 tablet Refills: 0       Sig: TAKE 1 TABLET BY MOUTH DAILY. *PATIENT NEEDS APPOINTMENT FOR FURTHER REFILLS*   2)--- ezetimibe (ZETIA) 10 MG tablet [id number]   Order Details Dose: 10 mg Route: Oral Frequency: Daily at bedtime  Dispense Quantity: 15 tablet Refills: 0       Sig: Take 1 tablet (10 mg total) by mouth at bedtime. *PATIENT NEEDS APPOINTMENT FOR FURTHER REFILLS*       3)---valsartan-hydrochlorothiazide (DIOVAN-HCT) 320-25 MG tablet [id number]   Order Details Dose: 1 tablet Route: Oral Frequency: Daily  Dispense Quantity: 15 tablet Refills: 0       Sig: TAKE 1 TABLET BY MOUTH DAILY. *PATIENT NEEDS APPOINTMENT FOR FURTHER REFILLS*   --Forwarding request to med asst & if approved send refill order to :    [place], [place] - [place] [phone] (Phone) [phone] (Fax)   --glh

## 2019-03-16 ENCOUNTER — Other Ambulatory Visit: Payer: Self-pay | Admitting: Family Medicine

## 2019-03-16 ENCOUNTER — Ambulatory Visit: Payer: BC Managed Care – PPO | Attending: Internal Medicine

## 2019-03-16 DIAGNOSIS — I10 Essential (primary) hypertension: Secondary | ICD-10-CM

## 2019-03-16 DIAGNOSIS — Z23 Encounter for immunization: Secondary | ICD-10-CM

## 2019-03-16 DIAGNOSIS — E781 Pure hyperglyceridemia: Secondary | ICD-10-CM

## 2019-03-16 DIAGNOSIS — E782 Mixed hyperlipidemia: Secondary | ICD-10-CM

## 2019-03-16 NOTE — Progress Notes (Signed)
   Covid-19 Vaccination Clinic  Name:  Keith Bullock    MRN: [id number] DOB: 1960-02-29  03/16/2019  Keith Bullock was observed post Covid-19 immunization for 15 minutes without incidence. He was provided with Vaccine Information Sheet and instruction to access the V-Safe system.   Keith Bullock was instructed to call 911 with any severe reactions post vaccine: Marland Kitchen Difficulty breathing  . Swelling of your face and throat  . A fast heartbeat  . A bad rash all over your body  . Dizziness and weakness    Immunizations Administered    Name Date Dose VIS Date Route   Pfizer COVID-19 Vaccine 03/16/2019 10:56 AM 0.3 mL 12/30/2018 Intramuscular   Manufacturer: [place]   Lot: [id number]   [place]: [id number]

## 2019-03-16 NOTE — Telephone Encounter (Signed)
Done.  T. Makylie Rivere, CMA °

## 2019-04-11 ENCOUNTER — Ambulatory Visit: Payer: BC Managed Care – PPO | Attending: Internal Medicine

## 2019-04-11 DIAGNOSIS — Z23 Encounter for immunization: Secondary | ICD-10-CM

## 2019-04-11 NOTE — Progress Notes (Signed)
   Covid-19 Vaccination Clinic  Name:  Keith Bullock    MRN: [id number] DOB: 1960-08-07  04/11/2019  Mr. Dambrosio was observed post Covid-19 immunization for 15 minutes without incident. He was provided with Vaccine Information Sheet and instruction to access the V-Safe system.   Mr. Michalko was instructed to call 911 with any severe reactions post vaccine: Marland Kitchen Difficulty breathing  . Swelling of face and throat  . A fast heartbeat  . A bad rash all over body  . Dizziness and weakness   Immunizations Administered    Name Date Dose VIS Date Route   Pfizer COVID-19 Vaccine 04/11/2019  1:40 PM 0.3 mL 12/30/2018 Intramuscular   Manufacturer: [place]   Lot: [id number]   Home: [id number]

## 2019-04-18 ENCOUNTER — Other Ambulatory Visit: Payer: Self-pay

## 2019-04-18 ENCOUNTER — Encounter: Payer: Self-pay | Admitting: Family Medicine

## 2019-04-18 ENCOUNTER — Other Ambulatory Visit: Payer: BC Managed Care – PPO

## 2019-04-18 ENCOUNTER — Ambulatory Visit (INDEPENDENT_AMBULATORY_CARE_PROVIDER_SITE_OTHER): Payer: BC Managed Care – PPO | Admitting: Family Medicine

## 2019-04-18 VITALS — BP 114/70 | HR 73 | Temp 98.5°F | Ht 70.0 in | Wt 213.2 lb

## 2019-04-18 DIAGNOSIS — E781 Pure hyperglyceridemia: Secondary | ICD-10-CM

## 2019-04-18 DIAGNOSIS — F4322 Adjustment disorder with anxiety: Secondary | ICD-10-CM

## 2019-04-18 DIAGNOSIS — E785 Hyperlipidemia, unspecified: Secondary | ICD-10-CM | POA: Diagnosis not present

## 2019-04-18 DIAGNOSIS — E559 Vitamin D deficiency, unspecified: Secondary | ICD-10-CM | POA: Diagnosis not present

## 2019-04-18 DIAGNOSIS — I10 Essential (primary) hypertension: Secondary | ICD-10-CM | POA: Diagnosis not present

## 2019-04-18 DIAGNOSIS — Z79899 Other long term (current) drug therapy: Secondary | ICD-10-CM

## 2019-04-18 DIAGNOSIS — R7989 Other specified abnormal findings of blood chemistry: Secondary | ICD-10-CM

## 2019-04-18 DIAGNOSIS — Z1211 Encounter for screening for malignant neoplasm of colon: Secondary | ICD-10-CM | POA: Diagnosis not present

## 2019-04-18 DIAGNOSIS — R972 Elevated prostate specific antigen [PSA]: Secondary | ICD-10-CM

## 2019-04-18 DIAGNOSIS — R7301 Impaired fasting glucose: Secondary | ICD-10-CM

## 2019-04-18 DIAGNOSIS — E782 Mixed hyperlipidemia: Secondary | ICD-10-CM

## 2019-04-18 DIAGNOSIS — Z719 Counseling, unspecified: Secondary | ICD-10-CM

## 2019-04-18 DIAGNOSIS — Z0000 Encounter for general adult medical examination without abnormal findings: Secondary | ICD-10-CM

## 2019-04-18 DIAGNOSIS — N521 Erectile dysfunction due to diseases classified elsewhere: Secondary | ICD-10-CM

## 2019-04-18 LAB — POC HEMOCCULT BLD/STL (OFFICE/1-CARD/DIAGNOSTIC): Fecal Occult Blood, POC: NEGATIVE

## 2019-04-18 MED ORDER — BUSPIRONE HCL 10 MG PO TABS: 10.0000 mg | ORAL_TABLET | Freq: Three times a day (TID) | ORAL | 0 refills | Status: DC

## 2019-04-18 MED ORDER — OMEGA-3-ACID ETHYL ESTERS 1 G PO CAPS: 2.0000 g | ORAL_CAPSULE | Freq: Two times a day (BID) | ORAL | 0 refills | Status: DC

## 2019-04-18 MED ORDER — VALSARTAN-HYDROCHLOROTHIAZIDE 320-25 MG PO TABS: 1.0000 | ORAL_TABLET | Freq: Every day | ORAL | 0 refills | Status: DC

## 2019-04-18 MED ORDER — EZETIMIBE 10 MG PO TABS: ORAL_TABLET | ORAL | 0 refills | Status: DC

## 2019-04-18 MED ORDER — SILDENAFIL CITRATE 20 MG PO TABS: 60.0000 mg | ORAL_TABLET | Freq: Every day | ORAL | 0 refills | Status: DC | PRN

## 2019-04-18 MED ORDER — AMLODIPINE BESYLATE 10 MG PO TABS: ORAL_TABLET | ORAL | 0 refills | Status: DC

## 2019-04-18 NOTE — Patient Instructions (Signed)
Preventive Care, Male Preventive care refers to lifestyle choices and visits with your health care provider that can promote health and wellness. What does preventive care include?   A yearly physical exam. This is also called an annual well check.  Dental exams once or twice a year.  Routine eye exams. Ask your health care provider how often you should have your eyes checked.  Personal lifestyle choices, including: ? Daily care of your teeth and gums. ? Regular physical activity. ? Eating a healthy diet. ? Avoiding tobacco and drug use. ? Limiting alcohol use. ? Practicing safe sex. ? Taking low doses of aspirin every day. ? Taking vitamin and mineral supplements as recommended by your health care provider. What happens during an annual well check? The services and screenings done by your health care provider during your annual well check will depend on your age, overall health, lifestyle risk factors, and family history of disease. Counseling Your health care provider may ask you questions about your:  Alcohol use.  Tobacco use.  Drug use.  Emotional well-being.  Home and relationship well-being.  Sexual activity.  Eating habits.  History of falls.  Memory and ability to understand (cognition).  Work and work Statistician. Screening You may have the following tests or measurements:  Height, weight, and BMI.  Blood pressure.  Lipid and cholesterol levels. These may be checked every 5 years, or more frequently if you are over 40 years old.  Skin check.  Lung cancer screening. You may have this screening every year starting at age 42 if you have a 30-pack-year history of smoking and currently smoke or have quit within the past 15 years.  Colorectal cancer screening. All adults should have this screening starting at age 39 and continuing until age 27. You will have tests every 1-10 years, depending on your results and the type of screening test. People at  increased risk should start screening at an earlier age. Screening tests may include: ? Guaiac-based fecal occult blood testing. ? Fecal immunochemical test (FIT). ? Stool DNA test. ? Virtual colonoscopy. ? Sigmoidoscopy. During this test, a flexible tube with a tiny camera (sigmoidoscope) is used to examine your rectum and lower colon. The sigmoidoscope is inserted through your anus into your rectum and lower colon. ? Colonoscopy. During this test, a long, thin, flexible tube with a tiny camera (colonoscope) is used to examine your entire colon and rectum.  Prostate cancer screening. Recommendations will vary depending on your family history and other risks.  Hepatitis C blood test.  Hepatitis B blood test.  Sexually transmitted disease (STD) testing.  Diabetes screening. This is done by checking your blood sugar (glucose) after you have not eaten for a while (fasting). You may have this done every 1-3 years.  Abdominal aortic aneurysm (AAA) screening. You may need this if you are a current or former smoker.  Osteoporosis. You may be screened starting at age 18 if you are at high risk. Talk with your health care provider about your test results, treatment options, and if necessary, the need for more tests. Vaccines Your health care provider may recommend certain vaccines, such as:  Influenza vaccine. This is recommended every year.  Tetanus, diphtheria, and acellular pertussis (Tdap, Td) vaccine. You may need a Td booster every 10 years.  Varicella vaccine. You may need this if you have not been vaccinated.  Zoster vaccine. You may need this after age 78.  Measles, mumps, and rubella (MMR) vaccine. You may need  at least one dose of MMR if you were born in 1957 or later. You may also need a second dose. °· Pneumococcal 13-valent conjugate (PCV13) vaccine. One dose is recommended after age 65. °· Pneumococcal polysaccharide (PPSV23) vaccine. One dose is recommended after age  65. °· Meningococcal vaccine. You may need this if you have certain conditions. °· Hepatitis A vaccine. You may need this if you have certain conditions or if you travel or work in places where you may be exposed to hepatitis A. °· Hepatitis B vaccine. You may need this if you have certain conditions or if you travel or work in places where you may be exposed to hepatitis B. °· Haemophilus influenzae type b (Hib) vaccine. You may need this if you have certain risk factors. °Talk to your health care provider about which screenings and vaccines you need and how often you need them. °This information is not intended to replace advice given to you by your health care provider. Make sure you discuss any questions you have with your health care provider. °Document Released: 02/01/2015 Document Revised: 02/25/2017 Document Reviewed: 11/06/2014 °Elsevier Interactive Patient Education © 2019 [employer] ° ° ° ° ° ° ° °Preventive Care for Adults, Male °A healthy lifestyle and preventive care can promote health and wellness. Preventive health guidelines for men include the following key practices: °· A routine yearly physical is a good way to check with your health care provider about your health and preventative screening. It is a chance to share any concerns and updates on your health and to receive a thorough exam. °· Visit your dentist for a routine exam and preventative care every 6 months. Brush your teeth twice a day and floss once a day. Good oral hygiene prevents tooth decay and gum disease. °· The frequency of eye exams is based on your age, health, family medical history, use of contact lenses, and other factors. Follow your health care provider's recommendations for frequency of eye exams. °· Eat a healthy diet. Foods such as vegetables, fruits, whole grains, low-fat dairy products, and lean protein foods contain the nutrients you need without too many calories. Decrease your intake of foods high in solid fats,  added sugars, and salt. Eat the right amount of calories for you. Get information about a proper diet from your health care provider, if necessary. °· Regular physical exercise is one of the most important things you can do for your health. Most adults should get at least 150 minutes of moderate-intensity exercise (any activity that increases your heart rate and causes you to sweat) each week. In addition, most adults need muscle-strengthening exercises on 2 or more days a week. °· Maintain a healthy weight. The body mass index (BMI) is a screening tool to identify possible weight problems. It provides an estimate of body fat based on height and weight. Your health care provider can find your BMI and can help you achieve or maintain a healthy weight. For adults 20 years and older: °¨ A BMI below 18.5 is considered underweight. °¨ A BMI of 18.5 to 24.9 is normal. °¨ A BMI of 25 to 29.9 is considered overweight. °¨ A BMI of 30 and above is considered obese. °· Maintain normal blood lipids and cholesterol levels by exercising and minimizing your intake of saturated fat. Eat a balanced diet with plenty of fruit and vegetables. Blood tests for lipids and cholesterol should begin at age 20 and be repeated every 5 years. If your lipid or cholesterol   levels are high, you are over 50, or you are at high risk for heart disease, you may need your cholesterol levels checked more frequently. Ongoing high lipid and cholesterol levels should be treated with medicines if diet and exercise are not working.  If you smoke, find out from your health care provider how to quit. If you do not use tobacco, do not start.  Lung cancer screening is recommended for adults aged 71-80 years who are at high risk for developing lung cancer because of a history of smoking. A yearly low-dose CT scan of the lungs is recommended for people who have at least a 30-pack-year history of smoking and are a current smoker or have quit within the past 15  years. A pack year of smoking is smoking an average of 1 pack of cigarettes a day for 1 year (for example: 1 pack a day for 30 years or 2 packs a day for 15 years). Yearly screening should continue until the smoker has stopped smoking for at least 15 years. Yearly screening should be stopped for people who develop a health problem that would prevent them from having lung cancer treatment.  If you choose to drink alcohol, do not have more than 2 drinks per day. One drink is considered to be 12 ounces (355 mL) of beer, 5 ounces (148 mL) of wine, or 1.5 ounces (44 mL) of liquor.  Avoid use of street drugs. Do not share needles with anyone. Ask for help if you need support or instructions about stopping the use of drugs.  High blood pressure causes heart disease and increases the risk of stroke. Your blood pressure should be checked at least every 1-2 years. Ongoing high blood pressure should be treated with medicines, if weight loss and exercise are not effective.  If you are 36-60 years old, ask your health care provider if you should take aspirin to prevent heart disease.  Diabetes screening is done by taking a blood sample to check your blood glucose level after you have not eaten for a certain period of time (fasting). If you are not overweight and you do not have risk factors for diabetes, you should be screened once every 3 years starting at age 42. If you are overweight or obese and you are 20-59 years of age, you should be screened for diabetes every year as part of your cardiovascular risk assessment.  Colorectal cancer can be detected and often prevented. Most routine colorectal cancer screening begins at the age of 80 and continues through age 41. However, your health care provider may recommend screening at an earlier age if you have risk factors for colon cancer. On a yearly basis, your health care provider may provide home test kits to check for hidden blood in the stool. Use of a small camera  at the end of a tube to directly examine the colon (sigmoidoscopy or colonoscopy) can detect the earliest forms of colorectal cancer. Talk to your health care provider about this at age 18, when routine screening begins. Direct exam of the colon should be repeated every 5-10 years through age 86, unless early forms of precancerous polyps or small growths are found.  People who are at an increased risk for hepatitis B should be screened for this virus. You are considered at high risk for hepatitis B if:  You were born in a country where hepatitis B occurs often. Talk with your health care provider about which countries are considered high risk.  Your parents  were born in a high-risk country and you have not received a shot to protect against hepatitis B (hepatitis B vaccine).  You have HIV or AIDS.  You use needles to inject street drugs.  You live with, or have sex with, someone who has hepatitis B.  You are a man who has sex with other men (MSM).  You get hemodialysis treatment.  You take certain medicines for conditions such as cancer, organ transplantation, and autoimmune conditions.  Hepatitis C blood testing is recommended for all people born from [birth year] through 1965 and any individual with known risks for hepatitis C.  Practice safe sex. Use condoms and avoid high-risk sexual practices to reduce the spread of sexually transmitted infections (STIs). STIs include gonorrhea, chlamydia, syphilis, trichomonas, herpes, HPV, and human immunodeficiency virus (HIV). Herpes, HIV, and HPV are viral illnesses that have no cure. They can result in disability, cancer, and death.  If you are a man who has sex with other men, you should be screened at least once per year for:  HIV.  Urethral, rectal, and pharyngeal infection of gonorrhea, chlamydia, or both.  If you are at risk of being infected with HIV, it is recommended that you take a prescription medicine daily to prevent HIV infection. This  is called preexposure prophylaxis (PrEP). You are considered at risk if:  You are a man who has sex with other men (MSM) and have other risk factors.  You are a heterosexual man, are sexually active, and are at increased risk for HIV infection.  You take drugs by injection.  You are sexually active with a partner who has HIV.  Talk with your health care provider about whether you are at high risk of being infected with HIV. If you choose to begin PrEP, you should first be tested for HIV. You should then be tested every 3 months for as long as you are taking PrEP.  A one-time screening for abdominal aortic aneurysm (AAA) and surgical repair of large AAAs by ultrasound are recommended for men ages 42 to 30 years who are current or former smokers.  Healthy men should no longer receive prostate-specific antigen (PSA) blood tests as part of routine cancer screening. Talk with your health care provider about prostate cancer screening.  Testicular cancer screening is not recommended for adult males who have no symptoms. Screening includes self-exam, a health care provider exam, and other screening tests. Consult with your health care provider about any symptoms you have or any concerns you have about testicular cancer.  Use sunscreen. Apply sunscreen liberally and repeatedly throughout the day. You should seek shade when your shadow is shorter than you. Protect yourself by wearing long sleeves, pants, a wide-brimmed hat, and sunglasses year round, whenever you are outdoors.  Once a month, do a whole-body skin exam, using a mirror to look at the skin on your back. Tell your health care provider about new moles, moles that have irregular borders, moles that are larger than a pencil eraser, or moles that have changed in shape or color.  Stay current with required vaccines (immunizations).  Influenza vaccine. All adults should be immunized every year.  Tetanus, diphtheria, and acellular pertussis (Td,  Tdap) vaccine. An adult who has not previously received Tdap or who does not know his vaccine status should receive 1 dose of Tdap. This initial dose should be followed by tetanus and diphtheria toxoids (Td) booster doses every 10 years. Adults with an unknown or incomplete history of completing a 3-dose  immunization series with Td-containing vaccines should begin or complete a primary immunization series including a Tdap dose. Adults should receive a Td booster every 10 years.  Varicella vaccine. An adult without evidence of immunity to varicella should receive 2 doses or a second dose if he has previously received 1 dose.  Human papillomavirus (HPV) vaccine. Males aged 11-21 years who have not received the vaccine previously should receive the 3-dose series. Males aged 22-26 years may be immunized. Immunization is recommended through the age of 25 years for any male who has sex with males and did not get any or all doses earlier. Immunization is recommended for any person with an immunocompromised condition through the age of 50 years if he did not get any or all doses earlier. During the 3-dose series, the second dose should be obtained 4-8 weeks after the first dose. The third dose should be obtained 24 weeks after the first dose and 16 weeks after the second dose.  Zoster vaccine. One dose is recommended for adults aged 86 years or older unless certain conditions are present.  Measles, mumps, and rubella (MMR) vaccine. Adults born before [birth year] generally are considered immune to measles and mumps. Adults born in [birth year] or later should have 1 or more doses of MMR vaccine unless there is a contraindication to the vaccine or there is laboratory evidence of immunity to each of the three diseases. A routine second dose of MMR vaccine should be obtained at least 28 days after the first dose for students attending postsecondary schools, health care workers, or international travelers. People who received  inactivated measles vaccine or an unknown type of measles vaccine during 1963-1967 should receive 2 doses of MMR vaccine. People who received inactivated mumps vaccine or an unknown type of mumps vaccine before 1979 and are at high risk for mumps infection should consider immunization with 2 doses of MMR vaccine. Unvaccinated health care workers born before [birth year] who lack laboratory evidence of measles, mumps, or rubella immunity or laboratory confirmation of disease should consider measles and mumps immunization with 2 doses of MMR vaccine or rubella immunization with 1 dose of MMR vaccine.  Pneumococcal 13-valent conjugate (PCV13) vaccine. When indicated, a person who is uncertain of his immunization history and has no record of immunization should receive the PCV13 vaccine. All adults 62 years of age and older should receive this vaccine. An adult aged 77 years or older who has certain medical conditions and has not been previously immunized should receive 1 dose of PCV13 vaccine. This PCV13 should be followed with a dose of pneumococcal polysaccharide (PPSV23) vaccine. Adults who are at high risk for pneumococcal disease should obtain the PPSV23 vaccine at least 8 weeks after the dose of PCV13 vaccine. Adults older than 59 years of age who have normal immune system function should obtain the PPSV23 vaccine dose at least 1 year after the dose of PCV13 vaccine.  Pneumococcal polysaccharide (PPSV23) vaccine. When PCV13 is also indicated, PCV13 should be obtained first. All adults aged 40 years and older should be immunized. An adult younger than age 62 years who has certain medical conditions should be immunized. Any person who resides in a nursing home or long-term care facility should be immunized. An adult smoker should be immunized. People with an immunocompromised condition and certain other conditions should receive both PCV13 and PPSV23 vaccines. People with human immunodeficiency virus (HIV) infection  should be immunized as soon as possible after diagnosis. Immunization during chemotherapy or radiation therapy should  be avoided. Routine use of PPSV23 vaccine is not recommended for American Indians, [place] Natives, or people younger than 65 years unless there are medical conditions that require PPSV23 vaccine. When indicated, people who have unknown immunization and have no record of immunization should receive PPSV23 vaccine. One-time revaccination 5 years after the first dose of PPSV23 is recommended for people aged 19-64 years who have chronic kidney failure, nephrotic syndrome, asplenia, or immunocompromised conditions. People who received 1-2 doses of PPSV23 before age 66 years should receive another dose of PPSV23 vaccine at age 62 years or later if at least 5 years have passed since the previous dose. Doses of PPSV23 are not needed for people immunized with PPSV23 at or after age 31 years.  Meningococcal vaccine. Adults with asplenia or persistent complement component deficiencies should receive 2 doses of quadrivalent meningococcal conjugate (MenACWY-D) vaccine. The doses should be obtained at least 2 months apart. Microbiologists working with certain meningococcal bacteria, [place] recruits, people at risk during an outbreak, and people who travel to or live in countries with a high rate of meningitis should be immunized. A first-year college student up through age 80 years who is living in a residence hall should receive a dose if he did not receive a dose on or after his 16th birthday. Adults who have certain high-risk conditions should receive one or more doses of vaccine.  Hepatitis A vaccine. Adults who wish to be protected from this disease, have chronic liver disease, work with hepatitis A-infected animals, work in hepatitis A research labs, or travel to or work in countries with a high rate of hepatitis A should be immunized. Adults who were previously unvaccinated and who anticipate close  contact with an international adoptee during the first 60 days after arrival in the [place] from a country with a high rate of hepatitis A should be immunized.  Hepatitis B vaccine. Adults should be immunized if they wish to be protected from this disease, are under age 89 years and have diabetes, have chronic liver disease, have had more than one sex partner in the past 6 months, may be exposed to blood or other infectious body fluids, are household contacts or sex partners of hepatitis B positive people, are clients or workers in certain care facilities, or travel to or work in countries with a high rate of hepatitis B.  Haemophilus influenzae type b (Hib) vaccine. A previously unvaccinated person with asplenia or sickle cell disease or having a scheduled splenectomy should receive 1 dose of Hib vaccine. Regardless of previous immunization, a recipient of a hematopoietic stem cell transplant should receive a 3-dose series 6-12 months after his successful transplant. Hib vaccine is not recommended for adults with HIV infection. Preventive Service / Frequency Ages 42 to 29  Blood pressure check.** / Every 3-5 years.  Lipid and cholesterol check.** / Every 5 years beginning at age 64.  Hepatitis C blood test.** / For any individual with known risks for hepatitis C.  Skin self-exam. / Monthly.  Influenza vaccine. / Every year.  Tetanus, diphtheria, and acellular pertussis (Tdap, Td) vaccine.** / Consult your health care provider. 1 dose of Td every 10 years.  Varicella vaccine.** / Consult your health care provider.  HPV vaccine. / 3 doses over 6 months, if 57 or younger.  Measles, mumps, rubella (MMR) vaccine.** / You need at least 1 dose of MMR if you were born in [birth year] or later. You may also need a second dose.  Pneumococcal 13-valent conjugate (  PCV13) vaccine.** / Consult your health care provider.  Pneumococcal polysaccharide (PPSV23) vaccine.** / 1 to 2 doses if you smoke  cigarettes or if you have certain conditions.  Meningococcal vaccine.** / 1 dose if you are age 51 to 42 years and a Market researcher living in a residence hall, or have one of several medical conditions. You may also need additional booster doses.  Hepatitis A vaccine.** / Consult your health care provider.  Hepatitis B vaccine.** / Consult your health care provider.  Haemophilus influenzae type b (Hib) vaccine.** / Consult your health care provider. Ages 30 to 76  Blood pressure check.** / Every year.  Lipid and cholesterol check.** / Every 5 years beginning at age 38.  Lung cancer screening. / Every year if you are aged 92-80 years and have a 30-pack-year history of smoking and currently smoke or have quit within the past 15 years. Yearly screening is stopped once you have quit smoking for at least 15 years or develop a health problem that would prevent you from having lung cancer treatment.  Fecal occult blood test (FOBT) of stool. / Every year beginning at age 24 and continuing until age 51. You may not have to do this test if you get a colonoscopy every 10 years.  Flexible sigmoidoscopy** or colonoscopy.** / Every 5 years for a flexible sigmoidoscopy or every 10 years for a colonoscopy beginning at age 76 and continuing until age 32.  Hepatitis C blood test.** / For all people born from [birth year] through 1965 and any individual with known risks for hepatitis C.  Skin self-exam. / Monthly.  Influenza vaccine. / Every year.  Tetanus, diphtheria, and acellular pertussis (Tdap/Td) vaccine.** / Consult your health care provider. 1 dose of Td every 10 years.  Varicella vaccine.** / Consult your health care provider.  Zoster vaccine.** / 1 dose for adults aged 55 years or older.  Measles, mumps, rubella (MMR) vaccine.** / You need at least 1 dose of MMR if you were born in 1957 or later. You may also need a second dose.  Pneumococcal 13-valent conjugate (PCV13) vaccine.** /  Consult your health care provider.  Pneumococcal polysaccharide (PPSV23) vaccine.** / 1 to 2 doses if you smoke cigarettes or if you have certain conditions.  Meningococcal vaccine.** / Consult your health care provider.  Hepatitis A vaccine.** / Consult your health care provider.  Hepatitis B vaccine.** / Consult your health care provider.  Haemophilus influenzae type b (Hib) vaccine.** / Consult your health care provider. Ages 75 and over  Blood pressure check.** / Every year.  Lipid and cholesterol check.**/ Every 5 years beginning at age 62.  Lung cancer screening. / Every year if you are aged 79-80 years and have a 30-pack-year history of smoking and currently smoke or have quit within the past 15 years. Yearly screening is stopped once you have quit smoking for at least 15 years or develop a health problem that would prevent you from having lung cancer treatment.  Fecal occult blood test (FOBT) of stool. / Every year beginning at age 59 and continuing until age 75. You may not have to do this test if you get a colonoscopy every 10 years.  Flexible sigmoidoscopy** or colonoscopy.** / Every 5 years for a flexible sigmoidoscopy or every 10 years for a colonoscopy beginning at age 26 and continuing until age 30.  Hepatitis C blood test.** / For all people born from [birth year] through 1965 and any individual with known risks for hepatitis C.  Abdominal aortic aneurysm (AAA) screening.** / A one-time screening for ages 73 to 4 years who are current or former smokers.  Skin self-exam. / Monthly.  Influenza vaccine. / Every year.  Tetanus, diphtheria, and acellular pertussis (Tdap/Td) vaccine.** / 1 dose of Td every 10 years.  Varicella vaccine.** / Consult your health care provider.  Zoster vaccine.** / 1 dose for adults aged 106 years or older.  Pneumococcal 13-valent conjugate (PCV13) vaccine.** / 1 dose for all adults aged 16 years and older.  Pneumococcal polysaccharide (PPSV23)  vaccine.** / 1 dose for all adults aged 29 years and older.  Meningococcal vaccine.** / Consult your health care provider.  Hepatitis A vaccine.** / Consult your health care provider.  Hepatitis B vaccine.** / Consult your health care provider.  Haemophilus influenzae type b (Hib) vaccine.** / Consult your health care provider. **Family history and personal history of risk and conditions may change your health care provider's recommendations.   This information is not intended to replace advice given to you by your health care provider. Make sure you discuss any questions you have with your health care provider.   Document Released: 03/03/2001 Document Revised: 01/26/2014 Document Reviewed: 06/02/2010 Elsevier Interactive Patient Education [employer].

## 2019-04-18 NOTE — Progress Notes (Signed)
Male physical  Impression and Recommendations:    1. Encounter for wellness examination   2. Health education/counseling   3. Screening for colon cancer   4. Essential hypertension   5. Adjustment disorder with anxious mood   6. Mixed hyperlipidemia   7. Hypertriglyceridemia   8. Erectile dysfunction due to diseases classified elsewhere      1) Anticipatory Guidance: Discussed skin CA prevention and sunscreen when outside along with skin surveillance; eating a balanced and modest diet; physical activity at least 25 minutes per day or minimum of 150 min/ week moderate to intense activity.  - Strongly advised patient to cut back on alcohol intake, for improved health and wellbeing.  Advised patient to reduce alcohol intake to 1-2 beverages per day.  - Strongly advised patient to follow up with dermatology once yearly for skin screening, surveillance, and maintenance as needed.  Encouraged patient to have his wife assist him with examining his back every 4-6 months.  - Discussed prudent self-testicular screening habits with patient today.   2) Immunizations / Screenings / Labs:   All immunizations are up-to-date per recommendations or will be updated today if pt allows.    - Patient understands with dental and vision screens they will schedule independently.  - Will obtain CBC, CMP, HgA1c, Lipid panel, TSH and vit D when fasting, if not already done past 12 mo/ recently   - Lab work obtained this morning with PSA added.  - Last colonoscopy obtained in 10 of 2017 with repeat recommended in 5 years due to family history. - Discussed need for repeat in 2022.  - Shingles vaccine up to date.  - TDAP up to date.  - Low risk Hep C/HIV screen up to date.  - Patient obtained his second COVID-19 vaccination last Tuesday.   3) Health Counseling, Preventative Maintenance, Weight - BMI is 30.59 kg/m: BMI meaning discussed with patient.  Discussed goal to improve diet habits to  improve overall feelings of well being and objective health data. Improve nutrient density of diet through increasing intake of fruits and vegetables and decreasing saturated fats, white flour products and refined sugars.   - Advised patient to continue working toward exercising to improve overall mental, physical, and emotional health.    - Reviewed the "spokes of the wheel" of wellbeing.  Stressed the importance of ongoing prudent habits, including regular exercise, appropriate sleep hygiene, healthful dietary habits, and prayer/meditation to relax.  - Encouraged patient to engage in daily physical activity as tolerated, especially a formal exercise routine.  Recommended that the patient eventually strive for at least 150 minutes of moderate cardiovascular activity per week according to guidelines established by the [facility].   - Healthy dietary habits encouraged, including low-carb, and high amounts of lean protein in diet.   - Patient should also consume adequate amounts of water.  - Health counseling performed.  All questions answered.     Orders Placed This Encounter  Procedures  . POC Hemoccult Bld/Stl (1-Cd Office Dx)    Meds ordered this encounter  Medications  . amLODipine (NORVASC) 10 MG tablet    Sig: TAKE 1 TABLET BY MOUTH DAILY.    Dispense:  90 tablet    Refill:  0  . busPIRone (BUSPAR) 10 MG tablet    Sig: Take 1 tablet (10 mg total) by mouth 3 (three) times daily.    Dispense:  270 tablet    Refill:  0  . ezetimibe (ZETIA) 10 MG  tablet    Sig: TAKE 1 TABLET BY MOUTH AT BEDTIME.    Dispense:  90 tablet    Refill:  0  . sildenafil (REVATIO) 20 MG tablet    Sig: Take 3 tablets (60 mg total) by mouth daily as needed (erectile dysfunction).    Dispense:  30 tablet    Refill:  0  . valsartan-hydrochlorothiazide (DIOVAN-HCT) 320-25 MG tablet    Sig: Take 1 tablet by mouth daily.    Dispense:  90 tablet    Refill:  0  . omega-3 acid ethyl esters (LOVAZA) 1 g capsule     Sig: Take 2 capsules (2 g total) by mouth 2 (two) times daily.    Dispense:  360 capsule    Refill:  0     Return in about 4 months (around 08/18/2019) for HTN, Mood, etc ( inc buspar to 10mg  TID) .  Reminded pt important of f-up preventative CPE in 1 year.  Reminded pt again, this is in addition to any chronic care visits.    Gross side effects, risk and benefits, and alternatives of medications discussed with patient.  Patient is aware that all medications have potential side effects and we are unable to predict every side effect or drug-drug interaction that may occur.  Expresses verbal understanding and consents to current therapy plan and treatment regimen.  Please see AVS handed out to patient at the end of our visit for further patient instructions/ counseling done pertaining to today's office visit.    This case required medical decision making of at least moderate complexity.  This document serves as a record of services personally performed by Mellody Dance, DO. It was created on her behalf by Toni Amend, a trained medical scribe. The creation of this record is based on the scribe's personal observations and the provider's statements to them.    The above documentation from Toni Amend, medical scribe, has been reviewed by Marjory Sneddon, D.O.        Subjective:     I, Toni Amend, am serving as Education administrator for [employer].  CC: CPE  HPI: MARVON MOUSLEY is a 59 y.o. male who presents to [place] at [facility] today for a yearly health maintenance exam.     Health Maintenance Summary  - Reviewed and updated, unless pt declines services.  Last Cologuard or Colonoscopy:  Last colonoscopy obtained in 10 of 2017 with repeat recommended in 5 years due to family history. Family history of Colon CA:  Dad had colon cancer and died at age 11.  Tobacco History Reviewed:  Never smoker.  Alcohol / drug use:  Continues drinking the  same amount of alcohol.  Believes he probably consumes at least five Truly's per day, and occasionally has a "vodka drink or something" in addition at the golf [place].  Notes he doesn't keep it at the house. Exercise Habits:  Lately, he walks on the golf course.  Notes his phone tracks just under 6 miles of steps every time he plays golf, and he goes golfing at least four times per week.  Says "my legs have really strengthened up." Dental Home:  Does not see a dentist regularly. Eye exams:  Believes it has been over a year since his last eye examination.  He follows up at the same place as his wife, and visits usually once per year. Dermatology home:  Hasn't been back to dermatology since his last visit; notes "I haven't had issues."  Denies skin concerns or changes at present.  Male history: STD concerns:   none, monogamous Birth control method:  N/A. Additional penile/ urinary concerns:   Notes "it's just slow; I have an enlarged prostate."  Reports no significant change in his urinary status from before.  He did not experience side-effects while using sildenafil.  He did not notice any side-effects or concerns with his blood pressure during use.  On the sildenafil, he never experienced difficulties with his erection subsiding.  Denies headaches while using sildenafil, or other new or unusual side-effects.    He is aware that he has hemorrhoids.  Notes occasionally when he wipes, he notices blood.  Additional concerns beyond Health Maintenance issues:     - Mood Management Notes he recently increased his dose of Buspar to 10 mg TID, in the hopes that this will work better to manage his concerns.  Notes he has not been taking Prozac, not for several months.    Says he's been waking up in the middle of the night, and thinks over the past couple of weeks, with his increased dose of Buspar, he hasn't been waking up in the middle of the night as often.  In general, feels his anxiety has improved  "somewhat" since starting Buspar.  Says he thinks part of it is the entire past year, "with the whole pandemic thing."  Notes he's also had a real good friend battling brain cancer, glioblastoma, since before the holidays.  This has been causing him significant distress.    Immunization History  Administered Date(s) Administered  . Influenza Split Jun 16, 1960  . Influenza,inj,Quad PF,6+ Mos 11/13/2016, 11/15/2017, 11/28/2018  . Influenza-Unspecified 10/27/2011, 11/25/2015, 11/13/2016  . PFIZER SARS-COV-2 Vaccination 03/16/2019, 04/11/2019  . Tdap 10/27/2011  . Zoster Recombinat (Shingrix) 04/08/2017, 06/08/2017     Health Maintenance  Topic Date Due  . COLONOSCOPY  11/12/2020  . TETANUS/TDAP  10/26/2021  . INFLUENZA VACCINE  Completed  . Hepatitis C Screening  Completed  . HIV Screening  Completed       Wt Readings from Last 3 Encounters:  04/18/19 213 lb 3.2 oz (96.7 kg)  06/01/18 204 lb (92.5 kg)  02/01/18 201 lb 12.8 oz (91.5 kg)   BP Readings from Last 3 Encounters:  04/18/19 114/70  06/01/18 140/90  02/01/18 129/88   Pulse Readings from Last 3 Encounters:  04/18/19 73  06/01/18 75  02/01/18 68    Patient Active Problem List   Diagnosis Date Noted  . Hypertriglyceridemia 01/07/2016  . HLD (hyperlipidemia) 01/07/2016  . Essential hypertension 08/26/2015  . H/o back strain - M-SK and rad to sides/ front 01/07/2016  . Excessive drinking of alcohol 01/07/2016  . Obesity, Class I, BMI 30-34.9 01/07/2016  . h/o recent MRSA infection 08/26/2015  . Elevated PSA measurement 01/07/2016  . Decreased libido 01/07/2016  . ED (erectile dysfunction) 01/07/2016  . Transaminitis 06/01/2018  . Adjustment disorder with anxious mood 06/01/2018  . Glucose intolerance (impaired glucose tolerance) 06/08/2017  . High risk medication use-  Accutane from [facility] 06/08/2017  . Vitamin D deficiency 06/08/2017  . Chronic periumbilical pain with hernia 12/14/2016  . Irreducible  umbilical hernia [id number]  . Callus of foot 12/14/2016  . Elevated fasting blood sugar- 08/2016 w derm labs 12/14/2016  . Elevated LFTs 08/12/2016  . Myalgia due to statin 01/07/2016    Past Medical History:  Diagnosis Date  . Hypertension   . MRSA cellulitis     Past Surgical History:  Procedure Laterality Date  .  INSERTION OF MESH N/A 02/23/2017   Procedure: INSERTION OF MESH;  Surgeon: Ralene Ok, MD;  Location: [facility];  Service: General;  Laterality: N/A;  . ROTATOR CUFF REPAIR Left   . UMBILICAL HERNIA REPAIR N/A 02/23/2017   Procedure: LAPAROSCOPIC UMBILICAL HERNIA REPAIR WITH MESH;  Surgeon: Ralene Ok, MD;  Location: [facility] OR;  Service: General;  Laterality: N/A;    Family History  Problem Relation Age of Onset  . Cancer Mother        Ovarian  . Depression Mother   . Hypertension Mother   . Cancer Father        Colon  . Diabetes Paternal Grandfather     Social History   Substance and Sexual Activity  Drug Use No  ,  Social History   Substance and Sexual Activity  Alcohol Use Yes   Comment: 2-3 a day  ,  Social History   Tobacco Use  Smoking Status Never Smoker  Smokeless Tobacco Never Used  ,  Social History   Substance and Sexual Activity  Sexual Activity Yes    Patient's Medications  New Prescriptions   No medications on file  Previous Medications   BIOTIN 5000 MCG TABS    Take 1 tablet by mouth daily.   CETIRIZINE (ZYRTEC) 10 MG TABLET    Take 10 mg by mouth daily as needed for allergies.   CYANOCOBALAMIN (B-12) 500 MCG TABS    Take 1 tablet by mouth daily.   CYCLOBENZAPRINE (FLEXERIL) 10 MG TABLET    Take 1 tablet (10 mg total) by mouth 3 (three) times daily as needed for muscle spasms.   FAMOTIDINE-CA CARB-MAG HYDROX (PEPCID COMPLETE PO)    Take 1 tablet by mouth daily as needed (acid reflux).   FLUOXETINE (PROZAC) 10 MG TABLET    Use one half tablet daily for 1 week then increase to 1 tablet daily   MULTIPLE VITAMIN (MULTIVITAMIN  WITH MINERALS) TABS TABLET    Take 1 tablet by mouth daily.   NAPHAZOLINE-PHENIRAMINE (OPCON-A) 0.027-0.315 % SOLN    Apply 1 drop to eye daily as needed (dry eyes).   NAPROXEN SODIUM (ANAPROX) 220 MG TABLET    Take 220 mg by mouth daily as needed (pain).    TRAMADOL (ULTRAM) 50 MG TABLET    Take 1 tablet (50 mg total) by mouth every 6 (six) hours as needed.  Modified Medications   Modified Medication Previous Medication   AMLODIPINE (NORVASC) 10 MG TABLET amLODipine (NORVASC) 10 MG tablet      TAKE 1 TABLET BY MOUTH DAILY.    TAKE 1 TABLET BY MOUTH DAILY. *PATIENT NEEDS APPOINTMENT FOR FURTHER REFILLS*   BUSPIRONE (BUSPAR) 10 MG TABLET busPIRone (BUSPAR) 10 MG tablet      Take 1 tablet (10 mg total) by mouth 3 (three) times daily.    Take 0.5 tablets (5 mg total) by mouth 3 (three) times daily.   EZETIMIBE (ZETIA) 10 MG TABLET ezetimibe (ZETIA) 10 MG tablet      TAKE 1 TABLET BY MOUTH AT BEDTIME.    TAKE 1 TABLET BY MOUTH AT BEDTIME. *PATIENT NEEDS APPOINTMENT FOR FURTHER REFILLS*   OMEGA-3 ACID ETHYL ESTERS (LOVAZA) 1 G CAPSULE omega-3 acid ethyl esters (LOVAZA) 1 g capsule      Take 2 capsules (2 g total) by mouth 2 (two) times daily.    Take 2 capsules (2 g total) by mouth 2 (two) times daily.   SILDENAFIL (REVATIO) 20 MG TABLET sildenafil (REVATIO) 20  MG tablet      Take 3 tablets (60 mg total) by mouth daily as needed (erectile dysfunction).    Take 3 tablets (60 mg total) by mouth daily as needed (erectile dysfunction).   VALSARTAN-HYDROCHLOROTHIAZIDE (DIOVAN-HCT) 320-25 MG TABLET valsartan-hydrochlorothiazide (DIOVAN-HCT) 320-25 MG tablet      Take 1 tablet by mouth daily.    TAKE 1 TABLET BY MOUTH DAILY. *PATIENT NEEDS APPOINTMENT FOR FURTHER REFILLS*  Discontinued Medications   No medications on file    Flomax [tamsulosin hcl]  Review of Systems: General:   Denies fever, chills, unexplained weight loss.  Optho/Auditory:   Denies visual changes, blurred vision/LOV Respiratory:    Denies SOB, DOE more than baseline levels.   Cardiovascular:   Denies chest pain, palpitations, new onset peripheral edema  Gastrointestinal:   Denies nausea, vomiting, diarrhea.  Genitourinary: Denies dysuria, freq/ urgency, flank pain or discharge from genitals.  Endocrine:     Denies hot or cold intolerance, polyuria, polydipsia. Musculoskeletal:   Denies unexplained myalgias, joint swelling, unexplained arthralgias, gait problems.  Skin:  Denies rash, suspicious lesions Neurological:     Denies dizziness, unexplained weakness, numbness  Psychiatric/Behavioral:   Denies mood changes, suicidal or homicidal ideations, hallucinations    Objective:     Blood pressure 114/70, pulse 73, temperature 98.5 F (36.9 C), temperature source Oral, height 5\' 10"  (1.778 m), weight 213 lb 3.2 oz (96.7 kg), SpO2 97 %. Body mass index is 30.59 kg/m. General Appearance:    Alert, cooperative, no distress, appears stated age  Head:    Normocephalic, without obvious abnormality, atraumatic  Eyes:    PERRL, conjunctiva/corneas clear, EOM's intact, fundi    benign, both eyes  Ears:    Normal TM's and external ear canals, both ears  Nose:   Nares normal, septum midline, mucosa normal, no drainage    or sinus tenderness  Throat:   Lips w/o lesion, mucosa moist, and tongue normal; teeth and   gums normal  Neck:   Supple, symmetrical, trachea midline, no adenopathy;    thyroid:  no enlargement/tenderness/nodules; no carotid   bruit or JVD  Back:     Symmetric, no curvature, ROM normal, no CVA tenderness  Lungs:     Clear to auscultation bilaterally, respirations unlabored, no       Wh/ R/ R  Chest Wall:    No tenderness or gross deformity; normal excursion   Heart:    Regular rate and rhythm, S1 and S2 normal, no murmur, rub   or gallop  Abdomen:     Soft, non-tender, bowel sounds active all four quadrants, NO   G/R/R, no masses, no organomegaly  Genitalia:    Ext genitalia: without lesion, no penile  rash or discharge, no hernias appreciated   Rectal:    small external hemorrhoids, not thrombosed, not inflamed, no erythema. Normal tone, prostate slightly enlarged, and equal b/l, no tenderness; guaiac negative stool  Extremities:   Extremities normal, atraumatic, no cyanosis or gross edema  Pulses:   2+ and symmetric all extremities  Skin:   Warm, dry, Skin color, texture, turgor normal, no obvious rashes or lesions  M-Sk:   Ambulates * 4 w/o difficulty, no gross deformities, tone WNL  Neurologic:   CNII-XII intact, normal strength, sensation and reflexes    Throughout Psych:  No HI/SI, judgement and insight good, Euthymic mood. Full Affect.

## 2019-04-18 NOTE — Progress Notes (Signed)
Pt requests PSA be added to labs.  Per Dr. Raliegh Scarlet, ok to add PSA.  Pt denies motorcycle riding, riding lawnmower or having had sex in the last 3 days.  PSA added to labs.  Charyl Bigger, CMA

## 2019-04-18 NOTE — Addendum Note (Signed)
Addended by: Fonnie Mu on: 04/18/2019 09:36 AM   Modules accepted: Orders

## 2019-04-19 LAB — COMPREHENSIVE METABOLIC PANEL
ALT: 80 IU/L — ABNORMAL HIGH (ref 0–44)
AST: 81 IU/L — ABNORMAL HIGH (ref 0–40)
Albumin/Globulin Ratio: 1.8 (ref 1.2–2.2)
Albumin: 4.4 g/dL (ref 3.8–4.9)
Alkaline Phosphatase: 80 IU/L (ref 39–117)
BUN/Creatinine Ratio: 13 (ref 9–20)
BUN: 13 mg/dL (ref 6–24)
Bilirubin Total: 0.7 mg/dL (ref 0.0–1.2)
CO2: 22 mmol/L (ref 20–29)
Calcium: 9.2 mg/dL (ref 8.7–10.2)
Chloride: 91 mmol/L — ABNORMAL LOW (ref 96–106)
Creatinine, Ser: 1.01 mg/dL (ref 0.76–1.27)
GFR calc Af Amer: 94 mL/min/{1.73_m2} (ref >59)
GFR calc non Af Amer: 82 mL/min/{1.73_m2} (ref >59)
Globulin, Total: 2.5 g/dL (ref 1.5–4.5)
Glucose: 99 mg/dL (ref 65–99)
Potassium: 4.3 mmol/L (ref 3.5–5.2)
Sodium: 129 mmol/L — ABNORMAL LOW (ref 134–144)
Total Protein: 6.9 g/dL (ref 6.0–8.5)

## 2019-04-19 LAB — CBC WITH DIFFERENTIAL/PLATELET
Basophils Absolute: 0.1 10*3/uL (ref 0.0–0.2)
Basos: 2 %
EOS (ABSOLUTE): 0.1 10*3/uL (ref 0.0–0.4)
Eos: 4 %
Hematocrit: 44 % (ref 37.5–51.0)
Hemoglobin: 15.4 g/dL (ref 13.0–17.7)
Immature Grans (Abs): 0 10*3/uL (ref 0.0–0.1)
Immature Granulocytes: 0 %
Lymphocytes Absolute: 1.5 10*3/uL (ref 0.7–3.1)
Lymphs: 37 %
MCH: 32.6 pg (ref 26.6–33.0)
MCHC: 35 g/dL (ref 31.5–35.7)
MCV: 93 fL (ref 79–97)
Monocytes Absolute: 0.5 10*3/uL (ref 0.1–0.9)
Monocytes: 12 %
Neutrophils Absolute: 1.8 10*3/uL (ref 1.4–7.0)
Neutrophils: 45 %
Platelets: 259 10*3/uL (ref 150–450)
RBC: 4.72 x10E6/uL (ref 4.14–5.80)
RDW: 12.1 % (ref 11.6–15.4)
WBC: 3.9 10*3/uL (ref 3.4–10.8)

## 2019-04-19 LAB — LIPID PANEL
Chol/HDL Ratio: 2.6 ratio (ref 0.0–5.0)
Cholesterol, Total: 163 mg/dL (ref 100–199)
HDL: 62 mg/dL (ref >39)
LDL Chol Calc (NIH): 77 mg/dL (ref 0–99)
Triglycerides: 141 mg/dL (ref 0–149)
VLDL Cholesterol Cal: 24 mg/dL (ref 5–40)

## 2019-04-19 LAB — T4, FREE: Free T4: 1.17 ng/dL (ref 0.82–1.77)

## 2019-04-19 LAB — TSH: TSH: 2.85 u[IU]/mL (ref 0.450–4.500)

## 2019-04-19 LAB — VITAMIN D 25 HYDROXY (VIT D DEFICIENCY, FRACTURES): Vit D, 25-Hydroxy: 60.3 ng/mL (ref 30.0–100.0)

## 2019-04-19 LAB — HEMOGLOBIN A1C
Est. average glucose Bld gHb Est-mCnc: 111 mg/dL
Hgb A1c MFr Bld: 5.5 % (ref 4.8–5.6)

## 2019-04-19 LAB — PSA: Prostate Specific Ag, Serum: 3.8 ng/mL (ref 0.0–4.0)

## 2019-05-03 ENCOUNTER — Telehealth: Payer: Self-pay

## 2019-05-03 NOTE — Telephone Encounter (Signed)
PA for Lovaza was denied by [facility]  Would you like to try any alternative medications or therapies?  Please advise.  Charyl Bigger, CMA

## 2019-05-03 NOTE — Telephone Encounter (Signed)
That I would recommend he take over-the-counter fish oil of 1200 mg tabs.  Take 2 tabs every morning and 2 tabs 12 hours later.

## 2019-05-03 NOTE — Telephone Encounter (Signed)
05/03/2019  LVM for pt to call to discuss.  Charyl Bigger, CMA

## 2019-05-04 NOTE — Telephone Encounter (Signed)
05/04/2019  LVM for pt to call to discuss results.  Charyl Bigger, CMA

## 2019-05-05 NOTE — Telephone Encounter (Signed)
05/05/2019  MyChart message sent to pt.  Charyl Bigger, CMA

## 2019-06-16 ENCOUNTER — Telehealth: Payer: Self-pay | Admitting: Podiatry

## 2019-06-16 NOTE — Telephone Encounter (Signed)
Pt called and is wanting to order 2 pair of orthotics just like the ones he got in 2019. He does not want insurance billed and is aware the 1st pr is 438.00 and second pair is 219.00. I told pt I would talk with Liliane Channel and if any issues I would call him next week. Other wise I would call when they come in.Marland KitchenMarland Kitchen

## 2019-07-13 ENCOUNTER — Telehealth: Payer: Self-pay | Admitting: Orthotics

## 2019-07-13 DIAGNOSIS — M7751 Other enthesopathy of right foot: Secondary | ICD-10-CM | POA: Diagnosis not present

## 2019-07-13 DIAGNOSIS — M7752 Other enthesopathy of left foot: Secondary | ICD-10-CM

## 2019-07-13 NOTE — Telephone Encounter (Signed)
Pt returned your call and he goes by Keith Bullock and there are orthotics in [place] for the patient Keith Bullock from 2019 and the mrn does match. He is wanting 2 pair of orthotics.

## 2019-08-10 ENCOUNTER — Other Ambulatory Visit: Payer: Self-pay | Admitting: Physician Assistant

## 2019-08-10 DIAGNOSIS — E782 Mixed hyperlipidemia: Secondary | ICD-10-CM

## 2019-08-10 DIAGNOSIS — I10 Essential (primary) hypertension: Secondary | ICD-10-CM

## 2019-08-10 DIAGNOSIS — E781 Pure hyperglyceridemia: Secondary | ICD-10-CM

## 2019-08-17 ENCOUNTER — Telehealth: Payer: Self-pay | Admitting: Physician Assistant

## 2019-08-17 DIAGNOSIS — E782 Mixed hyperlipidemia: Secondary | ICD-10-CM

## 2019-08-17 DIAGNOSIS — I10 Essential (primary) hypertension: Secondary | ICD-10-CM

## 2019-08-17 DIAGNOSIS — E781 Pure hyperglyceridemia: Secondary | ICD-10-CM

## 2019-08-17 NOTE — Telephone Encounter (Signed)
Patient is requesting a refill of his valsartan, Zetia, and amlodipine. If approved please send to [facility].

## 2019-08-18 MED ORDER — VALSARTAN-HYDROCHLOROTHIAZIDE 320-25 MG PO TABS: 1.0000 | ORAL_TABLET | Freq: Every day | ORAL | 0 refills | Status: DC

## 2019-08-18 MED ORDER — EZETIMIBE 10 MG PO TABS: ORAL_TABLET | ORAL | 0 refills | Status: DC

## 2019-08-18 MED ORDER — AMLODIPINE BESYLATE 10 MG PO TABS: ORAL_TABLET | ORAL | 0 refills | Status: DC

## 2019-08-18 NOTE — Telephone Encounter (Signed)
Refills sent to requested pharmacy. AS, CMA 

## 2019-08-18 NOTE — Addendum Note (Signed)
Addended by: Mickel Crow on: 08/18/2019 08:12 AM   Modules accepted: Orders

## 2019-08-31 ENCOUNTER — Ambulatory Visit: Payer: BC Managed Care – PPO | Admitting: Physician Assistant

## 2019-09-13 ENCOUNTER — Ambulatory Visit (INDEPENDENT_AMBULATORY_CARE_PROVIDER_SITE_OTHER): Payer: BC Managed Care – PPO | Admitting: Physician Assistant

## 2019-09-13 ENCOUNTER — Encounter: Payer: Self-pay | Admitting: Physician Assistant

## 2019-09-13 ENCOUNTER — Other Ambulatory Visit: Payer: Self-pay

## 2019-09-13 VITALS — BP 126/81 | HR 68 | Ht 70.0 in | Wt 208.9 lb

## 2019-09-13 DIAGNOSIS — F4322 Adjustment disorder with anxiety: Secondary | ICD-10-CM

## 2019-09-13 DIAGNOSIS — E782 Mixed hyperlipidemia: Secondary | ICD-10-CM | POA: Diagnosis not present

## 2019-09-13 DIAGNOSIS — I10 Essential (primary) hypertension: Secondary | ICD-10-CM | POA: Diagnosis not present

## 2019-09-13 NOTE — Assessment & Plan Note (Signed)
-  BP at goal -Continue current medication regimen. -Continue ambulatory BP and pulse monitoring. -Follow low-sodium diet. -Checking CMP today for medication monitoring.

## 2019-09-13 NOTE — Patient Instructions (Signed)
DASH Eating Plan DASH stands for "Dietary Approaches to Stop Hypertension." The DASH eating plan is a healthy eating plan that has been shown to reduce high blood pressure (hypertension). It may also reduce your risk for type 2 diabetes, heart disease, and stroke. The DASH eating plan may also help with weight loss. What are tips for following this plan?  General guidelines  Avoid eating more than 2,300 mg (milligrams) of salt (sodium) a day. If you have hypertension, you may need to reduce your sodium intake to 1,500 mg a day.  Limit alcohol intake to no more than 1 drink a day for nonpregnant women and 2 drinks a day for men. One drink equals 12 oz of beer, 5 oz of wine, or 1 oz of hard liquor.  Work with your health care provider to maintain a healthy body weight or to lose weight. Ask what an ideal weight is for you.  Get at least 30 minutes of exercise that causes your heart to beat faster (aerobic exercise) most days of the week. Activities may include walking, swimming, or biking.  Work with your health care provider or diet and nutrition specialist (dietitian) to adjust your eating plan to your individual calorie needs. Reading food labels   Check food labels for the amount of sodium per serving. Choose foods with less than 5 percent of the Daily Value of sodium. Generally, foods with less than 300 mg of sodium per serving fit into this eating plan.  To find whole grains, look for the word "whole" as the first word in the ingredient list. Shopping  Buy products labeled as "low-sodium" or "no salt added."  Buy fresh foods. Avoid canned foods and premade or frozen meals. Cooking  Avoid adding salt when cooking. Use salt-free seasonings or herbs instead of table salt or sea salt. Check with your health care provider or pharmacist before using salt substitutes.  Do not fry foods. Cook foods using healthy methods such as baking, boiling, grilling, and broiling instead.  Cook with  heart-healthy oils, such as olive, canola, soybean, or sunflower oil. Meal planning  Eat a balanced diet that includes: ? 5 or more servings of fruits and vegetables each day. At each meal, try to fill half of your plate with fruits and vegetables. ? Up to 6-8 servings of whole grains each day. ? Less than 6 oz of lean meat, poultry, or fish each day. A 3-oz serving of meat is about the same size as a deck of cards. One egg equals 1 oz. ? 2 servings of low-fat dairy each day. ? A serving of nuts, seeds, or beans 5 times each week. ? Heart-healthy fats. Healthy fats called Omega-3 fatty acids are found in foods such as flaxseeds and coldwater fish, like sardines, salmon, and mackerel.  Limit how much you eat of the following: ? Canned or prepackaged foods. ? Food that is high in trans fat, such as fried foods. ? Food that is high in saturated fat, such as fatty meat. ? Sweets, desserts, sugary drinks, and other foods with added sugar. ? Full-fat dairy products.  Do not salt foods before eating.  Try to eat at least 2 vegetarian meals each week.  Eat more home-cooked food and less restaurant, buffet, and fast food.  When eating at a restaurant, ask that your food be prepared with less salt or no salt, if possible. What foods are recommended? The items listed may not be a complete list. Talk with your dietitian about   what dietary choices are best for you. Grains Whole-grain or whole-wheat bread. Whole-grain or whole-wheat pasta. Brown rice. Oatmeal. Quinoa. Bulgur. Whole-grain and low-sodium cereals. Pita bread. Low-fat, low-sodium crackers. Whole-wheat flour tortillas. Vegetables Fresh or frozen vegetables (raw, steamed, roasted, or grilled). Low-sodium or reduced-sodium tomato and vegetable juice. Low-sodium or reduced-sodium tomato sauce and tomato paste. Low-sodium or reduced-sodium canned vegetables. Fruits All fresh, dried, or frozen fruit. Canned fruit in natural juice (without  added sugar). Meat and other protein foods Skinless chicken or turkey. Ground chicken or turkey. Pork with fat trimmed off. Fish and seafood. Egg whites. Dried beans, peas, or lentils. Unsalted nuts, nut butters, and seeds. Unsalted canned beans. Lean cuts of beef with fat trimmed off. Low-sodium, lean deli meat. Dairy Low-fat (1%) or fat-free (skim) milk. Fat-free, low-fat, or reduced-fat cheeses. Nonfat, low-sodium ricotta or cottage cheese. Low-fat or nonfat yogurt. Low-fat, low-sodium cheese. Fats and oils Soft margarine without trans fats. Vegetable oil. Low-fat, reduced-fat, or light mayonnaise and salad dressings (reduced-sodium). Canola, safflower, olive, soybean, and sunflower oils. Avocado. Seasoning and other foods Herbs. Spices. Seasoning mixes without salt. Unsalted popcorn and pretzels. Fat-free sweets. What foods are not recommended? The items listed may not be a complete list. Talk with your dietitian about what dietary choices are best for you. Grains Baked goods made with fat, such as croissants, muffins, or some breads. Dry pasta or rice meal packs. Vegetables Creamed or fried vegetables. Vegetables in a cheese sauce. Regular canned vegetables (not low-sodium or reduced-sodium). Regular canned tomato sauce and paste (not low-sodium or reduced-sodium). Regular tomato and vegetable juice (not low-sodium or reduced-sodium). Pickles. Olives. Fruits Canned fruit in a light or heavy syrup. Fried fruit. Fruit in cream or butter sauce. Meat and other protein foods Fatty cuts of meat. Ribs. Fried meat. Bacon. Sausage. Bologna and other processed lunch meats. Salami. Fatback. Hotdogs. Bratwurst. Salted nuts and seeds. Canned beans with added salt. Canned or smoked fish. Whole eggs or egg yolks. Chicken or turkey with skin. Dairy Whole or 2% milk, cream, and half-and-half. Whole or full-fat cream cheese. Whole-fat or sweetened yogurt. Full-fat cheese. Nondairy creamers. Whipped toppings.  Processed cheese and cheese spreads. Fats and oils Butter. Stick margarine. Lard. Shortening. Ghee. Bacon fat. Tropical oils, such as coconut, palm kernel, or palm oil. Seasoning and other foods Salted popcorn and pretzels. Onion salt, garlic salt, seasoned salt, table salt, and sea salt. Worcestershire sauce. Tartar sauce. Barbecue sauce. Teriyaki sauce. Soy sauce, including reduced-sodium. Steak sauce. Canned and packaged gravies. Fish sauce. Oyster sauce. Cocktail sauce. Horseradish that you find on the shelf. Ketchup. Mustard. Meat flavorings and tenderizers. Bouillon cubes. Hot sauce and Tabasco sauce. Premade or packaged marinades. Premade or packaged taco seasonings. Relishes. Regular salad dressings. Where to find more information:  National Heart, Lung, and Blood Institute: www.nhlbi.nih.gov  American Heart Association: www.heart.org Summary  The DASH eating plan is a healthy eating plan that has been shown to reduce high blood pressure (hypertension). It may also reduce your risk for type 2 diabetes, heart disease, and stroke.  With the DASH eating plan, you should limit salt (sodium) intake to 2,300 mg a day. If you have hypertension, you may need to reduce your sodium intake to 1,500 mg a day.  When on the DASH eating plan, aim to eat more fresh fruits and vegetables, whole grains, lean proteins, low-fat dairy, and heart-healthy fats.  Work with your health care provider or diet and nutrition specialist (dietitian) to adjust your eating plan to your   individual calorie needs. This information is not intended to replace advice given to you by your health care provider. Make sure you discuss any questions you have with your health care provider. Document Revised: 12/18/2016 Document Reviewed: 12/30/2015 Elsevier Patient Education  2020 Elsevier Inc.  

## 2019-09-13 NOTE — Progress Notes (Signed)
Established Patient Office Visit  Subjective:  Patient ID: Keith Bullock, male    DOB: 11-13-1960  Age: 59 y.o. MRN: [id number]  CC:  Chief Complaint  Patient presents with  . Hyperlipidemia    HPI Keith Bullock presents for follow-up on hypertension and hyperlipidemia.  Patient has no acute concerns today.  HTN: Pt denies new onset chest pain, palpitations, dizziness or lower extremity swelling. Taking medication as directed without side effects. Checks BP at home and readings range in 120s/80. Pt follows a low salt diet.  HLD: Pt taking medication as directed without issues.  Reports his diet consist of lean meats, salads, vegetables and low carbohydrates.  Mood: Patient reports he stopped taking BuSpar. States last year he was waking up with panic attacks which have resolved.  Reports his mood is doing well.   Past Medical History:  Diagnosis Date  . Hypertension   . MRSA cellulitis     Past Surgical History:  Procedure Laterality Date  . INSERTION OF MESH N/A 02/23/2017   Procedure: INSERTION OF MESH;  Surgeon: Ralene Ok, MD;  Location: [facility];  Service: General;  Laterality: N/A;  . ROTATOR CUFF REPAIR Left   . UMBILICAL HERNIA REPAIR N/A 02/23/2017   Procedure: LAPAROSCOPIC UMBILICAL HERNIA REPAIR WITH MESH;  Surgeon: Ralene Ok, MD;  Location: [facility] OR;  Service: General;  Laterality: N/A;    Family History  Problem Relation Age of Onset  . Cancer Mother        Ovarian  . Depression Mother   . Hypertension Mother   . Cancer Father        Colon  . Diabetes Paternal Grandfather     Social History   Socioeconomic History  . Marital status: Married    Spouse name: Not on file  . Number of children: Not on file  . Years of education: Not on file  . Highest education level: Not on file  Occupational History  . Not on file  Tobacco Use  . Smoking status: Never Smoker  . Smokeless tobacco: Never Used  Vaping Use  . Vaping Use: Never used   Substance and Sexual Activity  . Alcohol use: Yes    Comment: 2-3 a day  . Drug use: No  . Sexual activity: Yes  Other Topics Concern  . Not on file  Social History Narrative  . Not on file   Social Determinants of Health   Financial Resource Strain:   . Difficulty of Paying Living Expenses: Not on file  Food Insecurity:   . Worried About Charity fundraiser in the Last Year: Not on file  . Ran Out of Food in the Last Year: Not on file  Transportation Needs:   . Lack of Transportation (Medical): Not on file  . Lack of Transportation (Non-Medical): Not on file  Physical Activity:   . Days of Exercise per Week: Not on file  . Minutes of Exercise per Session: Not on file  Stress:   . Feeling of Stress : Not on file  Social Connections:   . Frequency of Communication with Friends and Family: Not on file  . Frequency of Social Gatherings with Friends and Family: Not on file  . Attends Religious Services: Not on file  . Active Member of Clubs or Organizations: Not on file  . Attends Archivist Meetings: Not on file  . Marital Status: Not on file  Intimate Partner Violence:   . Fear of Current or  Ex-Partner: Not on file  . Emotionally Abused: Not on file  . Physically Abused: Not on file  . Sexually Abused: Not on file    Outpatient Medications Prior to Visit  Medication Sig Dispense Refill  . amLODipine (NORVASC) 10 MG tablet TAKE 1 TABLET BY MOUTH DAILY. 90 tablet 0  . Biotin 5000 MCG TABS Take 1 tablet by mouth daily.    . cetirizine (ZYRTEC) 10 MG tablet Take 10 mg by mouth daily as needed for allergies.    . Cyanocobalamin (B-12) 500 MCG TABS Take 1 tablet by mouth daily.    . cyclobenzaprine (FLEXERIL) 10 MG tablet Take 1 tablet (10 mg total) by mouth 3 (three) times daily as needed for muscle spasms. 30 tablet 0  . ezetimibe (ZETIA) 10 MG tablet TAKE 1 TABLET BY MOUTH AT BEDTIME. 90 tablet 0  . Famotidine-Ca Carb-Mag Hydrox (PEPCID COMPLETE PO) Take 1 tablet  by mouth daily as needed (acid reflux).    . Multiple Vitamin (MULTIVITAMIN WITH MINERALS) TABS tablet Take 1 tablet by mouth daily.    . Naphazoline-Pheniramine (OPCON-A) 0.027-0.315 % SOLN Apply 1 drop to eye daily as needed (dry eyes).    . naproxen sodium (ANAPROX) 220 MG tablet Take 220 mg by mouth daily as needed (pain).     Marland Kitchen omega-3 acid ethyl esters (LOVAZA) 1 g capsule Take 2 capsules (2 g total) by mouth 2 (two) times daily. 360 capsule 0  . sildenafil (REVATIO) 20 MG tablet Take 3 tablets (60 mg total) by mouth daily as needed (erectile dysfunction). 30 tablet 0  . traMADol (ULTRAM) 50 MG tablet Take 1 tablet (50 mg total) by mouth every 6 (six) hours as needed. 30 tablet 0  . valsartan-hydrochlorothiazide (DIOVAN-HCT) 320-25 MG tablet Take 1 tablet by mouth daily. 90 tablet 0  . busPIRone (BUSPAR) 10 MG tablet Take 1 tablet (10 mg total) by mouth 3 (three) times daily. (Patient not taking: Reported on 09/13/2019) 270 tablet 0  . FLUoxetine (PROZAC) 10 MG tablet Use one half tablet daily for 1 week then increase to 1 tablet daily (Patient not taking: Reported on 09/13/2019) 90 tablet 1   No facility-administered medications prior to visit.    Allergies  Allergen Reactions  . Flomax [Tamsulosin Hcl] Anxiety    ROS Review of Systems Review of Systems:  A fourteen system review of systems was performed and found to be positive as per HPI.    Objective:    Physical Exam General:  Well Developed, well nourished, appropriate for stated age.  Neuro:  Alert and oriented,  extra-ocular muscles intact, no focal deficits HEENT:  Normocephalic, atraumatic, neck supple Skin:  no gross rash, warm, pink. Cardiac:  RRR, S1 S2 Respiratory:  ECTA B/L and A/P, Not using accessory muscles, speaking in full sentences- unlabored. Vascular:  Ext warm, no cyanosis apprec.; cap RF less 2 sec. no gross edema Psych:  No HI/SI, judgement and insight good, Euthymic mood. Full Affect.   BP 126/81    Pulse 68   Ht 5\' 10"  (1.778 m)   Wt 208 lb 14.4 oz (94.8 kg)   SpO2 98%   BMI 29.97 kg/m  Wt Readings from Last 3 Encounters:  09/13/19 208 lb 14.4 oz (94.8 kg)  04/18/19 213 lb 3.2 oz (96.7 kg)  06/01/18 204 lb (92.5 kg)     Health Maintenance Due  Topic Date Due  . INFLUENZA VACCINE  08/20/2019    There are no preventive care reminders to display  for this patient.  Lab Results  Component Value Date   TSH 2.850 04/18/2019   Lab Results  Component Value Date   WBC 3.9 04/18/2019   HGB 15.4 04/18/2019   HCT 44.0 04/18/2019   MCV 93 04/18/2019   PLT 259 04/18/2019   Lab Results  Component Value Date   NA 129 (L) 04/18/2019   K 4.3 04/18/2019   CO2 22 04/18/2019   GLUCOSE 99 04/18/2019   BUN 13 04/18/2019   CREATININE 1.01 04/18/2019   BILITOT 0.7 04/18/2019   ALKPHOS 80 04/18/2019   AST 81 (H) 04/18/2019   ALT 80 (H) 04/18/2019   PROT 6.9 04/18/2019   ALBUMIN 4.4 04/18/2019   CALCIUM 9.2 04/18/2019   ANIONGAP 9 02/18/2017   Lab Results  Component Value Date   CHOL 163 04/18/2019   Lab Results  Component Value Date   HDL 62 04/18/2019   Lab Results  Component Value Date   LDLCALC 77 04/18/2019   Lab Results  Component Value Date   TRIG 141 04/18/2019   Lab Results  Component Value Date   CHOLHDL 2.6 04/18/2019   Lab Results  Component Value Date   HGBA1C 5.5 04/18/2019      Assessment & Plan:   Problem List Items Addressed This Visit      Cardiovascular and Mediastinum   Essential hypertension - Primary (Chronic)    -BP at goal -Continue current medication regimen. -Continue ambulatory BP and pulse monitoring. -Follow low-sodium diet. -Checking CMP today for medication monitoring.      Relevant Orders   CBC   Comprehensive metabolic panel   Hemoglobin A1c   TSH   Lipid panel     Other   HLD (hyperlipidemia) (Chronic)    -Last lipid panel wnl -Continue current medication regimen. -Follow heart healthy diet. -Rechecking  lipid panel today.      Relevant Orders   CBC   Comprehensive metabolic panel   Hemoglobin A1c   TSH   Lipid panel   Adjustment disorder with anxious mood    -Well-controlled -Will continue to monitor.      Relevant Orders   CBC   Comprehensive metabolic panel   Hemoglobin A1c   TSH   Lipid panel      No orders of the defined types were placed in this encounter.   Follow-up: Return in about 4 months (around 01/13/2020) for HTN, HLD.    Lorrene Reid, PA-C

## 2019-09-13 NOTE — Assessment & Plan Note (Signed)
-  Well-controlled -Will continue to monitor.

## 2019-09-13 NOTE — Assessment & Plan Note (Signed)
-  Last lipid panel wnl -Continue current medication regimen. -Follow heart healthy diet. -Rechecking lipid panel today.

## 2019-09-14 LAB — LIPID PANEL
Chol/HDL Ratio: 2.9 ratio (ref 0.0–5.0)
Cholesterol, Total: 173 mg/dL (ref 100–199)
HDL: 60 mg/dL (ref >39)
LDL Chol Calc (NIH): 94 mg/dL (ref 0–99)
Triglycerides: 108 mg/dL (ref 0–149)
VLDL Cholesterol Cal: 19 mg/dL (ref 5–40)

## 2019-09-14 LAB — CBC
Hematocrit: 43.8 % (ref 37.5–51.0)
Hemoglobin: 15.6 g/dL (ref 13.0–17.7)
MCH: 33 pg (ref 26.6–33.0)
MCHC: 35.6 g/dL (ref 31.5–35.7)
MCV: 93 fL (ref 79–97)
Platelets: 272 10*3/uL (ref 150–450)
RBC: 4.73 x10E6/uL (ref 4.14–5.80)
RDW: 12.2 % (ref 11.6–15.4)
WBC: 5.8 10*3/uL (ref 3.4–10.8)

## 2019-09-14 LAB — COMPREHENSIVE METABOLIC PANEL
ALT: 28 IU/L (ref 0–44)
AST: 31 IU/L (ref 0–40)
Albumin/Globulin Ratio: 1.8 (ref 1.2–2.2)
Albumin: 4.3 g/dL (ref 3.8–4.9)
Alkaline Phosphatase: 71 IU/L (ref 48–121)
BUN/Creatinine Ratio: 15 (ref 9–20)
BUN: 17 mg/dL (ref 6–24)
Bilirubin Total: 0.7 mg/dL (ref 0.0–1.2)
CO2: 26 mmol/L (ref 20–29)
Calcium: 9.4 mg/dL (ref 8.7–10.2)
Chloride: 98 mmol/L (ref 96–106)
Creatinine, Ser: 1.16 mg/dL (ref 0.76–1.27)
GFR calc Af Amer: 79 mL/min/{1.73_m2} (ref >59)
GFR calc non Af Amer: 69 mL/min/{1.73_m2} (ref >59)
Globulin, Total: 2.4 g/dL (ref 1.5–4.5)
Glucose: 113 mg/dL — ABNORMAL HIGH (ref 65–99)
Potassium: 4.2 mmol/L (ref 3.5–5.2)
Sodium: 136 mmol/L (ref 134–144)
Total Protein: 6.7 g/dL (ref 6.0–8.5)

## 2019-09-14 LAB — TSH: TSH: 3.2 u[IU]/mL (ref 0.450–4.500)

## 2019-09-14 LAB — HEMOGLOBIN A1C
Est. average glucose Bld gHb Est-mCnc: 114 mg/dL
Hgb A1c MFr Bld: 5.6 % (ref 4.8–5.6)

## 2019-11-27 ENCOUNTER — Other Ambulatory Visit: Payer: Self-pay | Admitting: Physician Assistant

## 2019-11-27 ENCOUNTER — Other Ambulatory Visit: Payer: Self-pay | Admitting: Family Medicine

## 2019-11-27 DIAGNOSIS — I10 Essential (primary) hypertension: Secondary | ICD-10-CM

## 2019-11-27 DIAGNOSIS — N521 Erectile dysfunction due to diseases classified elsewhere: Secondary | ICD-10-CM

## 2019-11-27 DIAGNOSIS — E782 Mixed hyperlipidemia: Secondary | ICD-10-CM

## 2019-11-27 DIAGNOSIS — E781 Pure hyperglyceridemia: Secondary | ICD-10-CM

## 2019-12-21 IMAGING — MR MR FOOT*L* W/O CM
5 series · 31 of 40 positions shown · non-contrast
Comparison: Plain films left foot 12/24/2016

CLINICAL DATA: Mass on the plantar surface of the right foot at the
first MTP joint with pain and swelling. The patient reports there
may be another lesion growing in the soft tissues of the great toe.

EXAM:
MRI OF THE LEFT FOOT WITHOUT CONTRAST
TECHNIQUE: Multiplanar, multisequence MR imaging of the left foot was
performed. No intravenous contrast was administered.

[Series 4: T1 · coronal · left · 3.0mm · 0.27mm/px · 8 of 46 slices shown (1 of 2)]
[im 1/46]
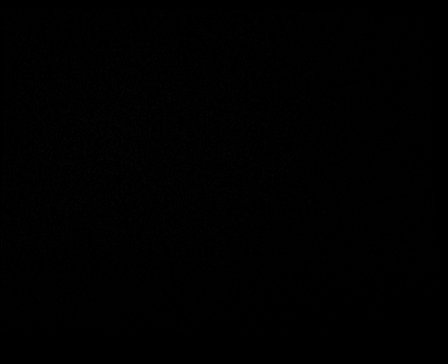
[im 6/46]
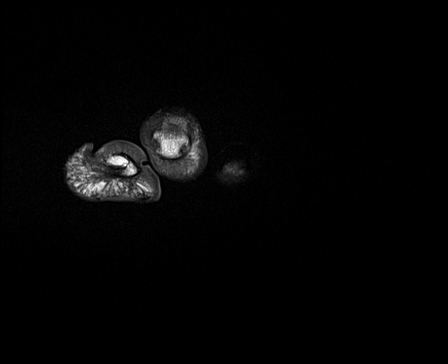
[im 16/46]
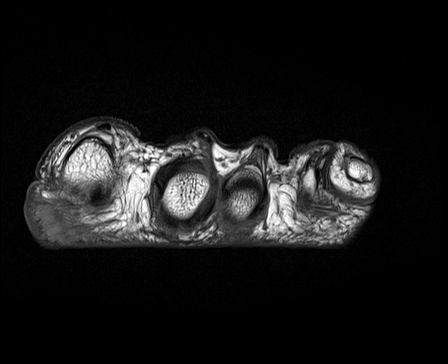
[im 21/46]
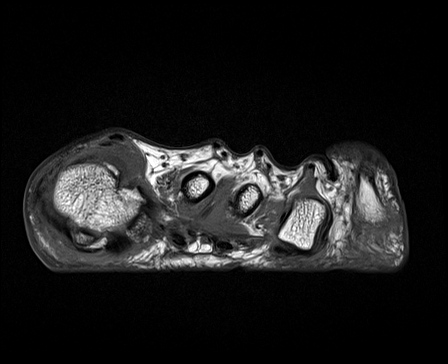
[im 26/46]
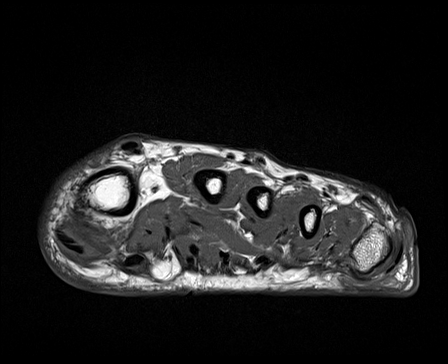
[im 31/46]
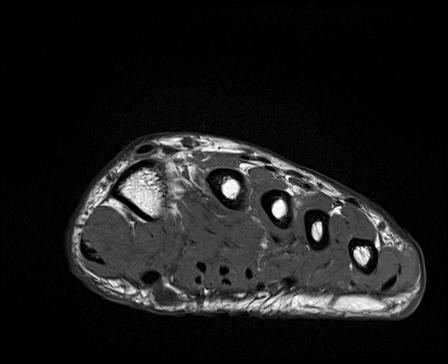
[im 41/46]
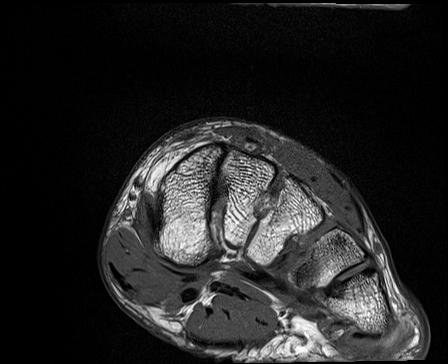
[im 46/46]
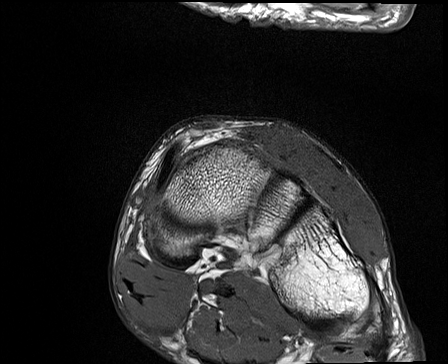

[Series 5: T2 fat-sat · coronal · left · 3.0mm · 0.27mm/px · 8 of 46 slices shown (1 of 3)]
[im 1/46]
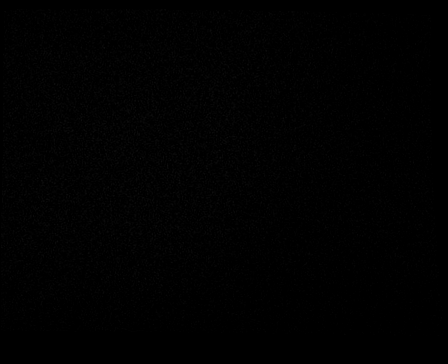
[im 6/46]
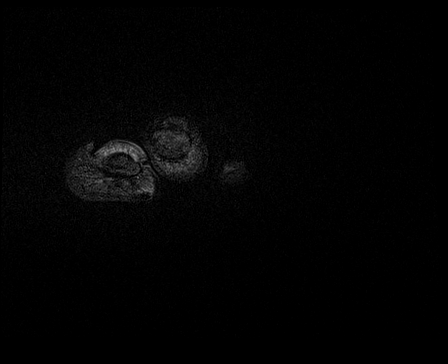
[im 16/46]
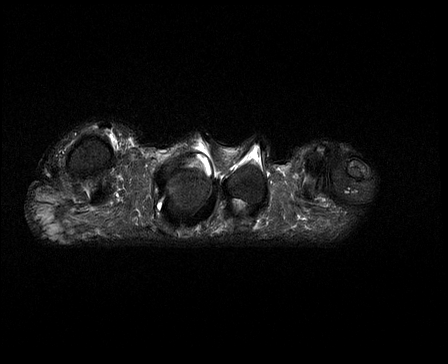
[im 21/46]
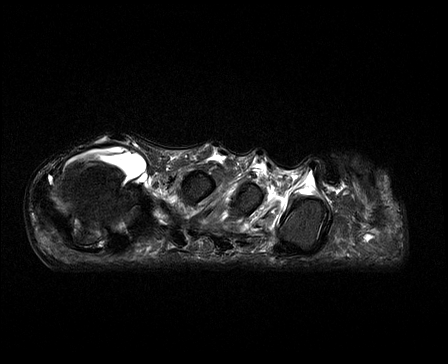
[im 26/46]
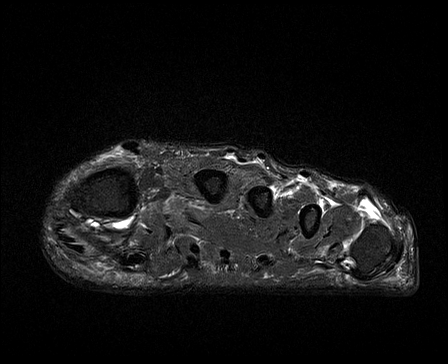
[im 31/46]
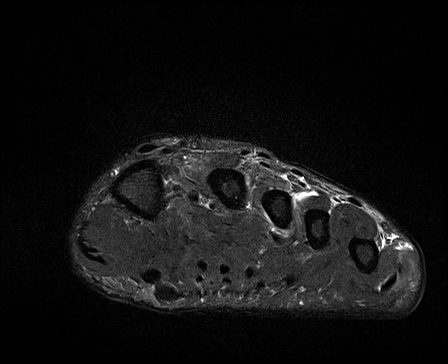
[im 41/46]
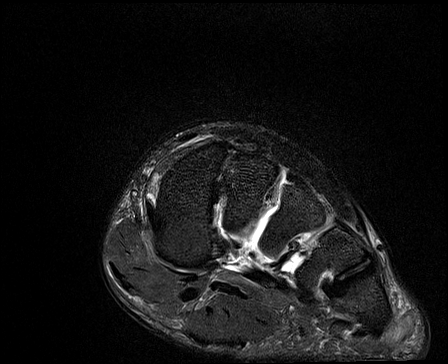
[im 46/46]
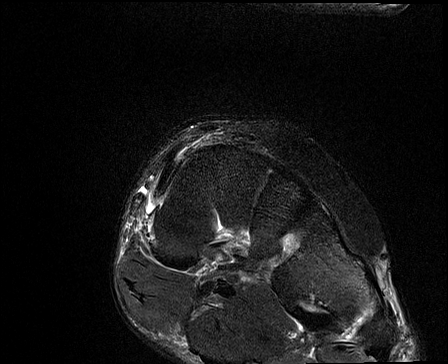

[Series 6: T2 fat-sat · sagittal · left · 3.0mm · 0.47mm/px · 8 of 34 slices shown (2 of 3)]
[im 1/34]
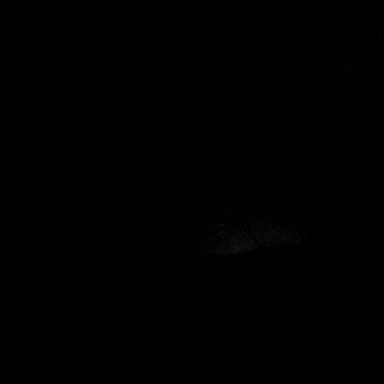
[im 5/34]
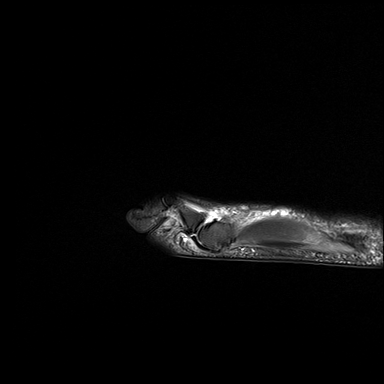
[im 10/34]
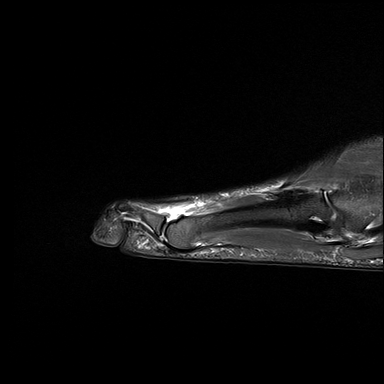
[im 15/34]
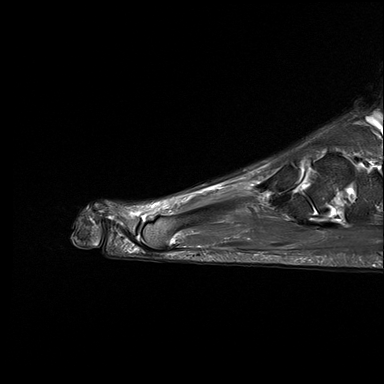
[im 19/34]
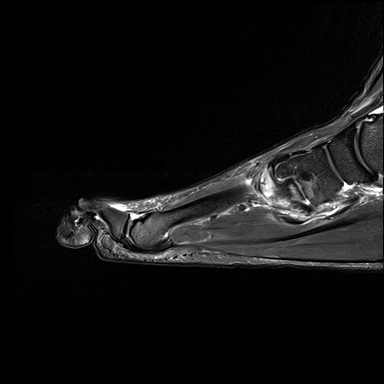
[im 24/34]
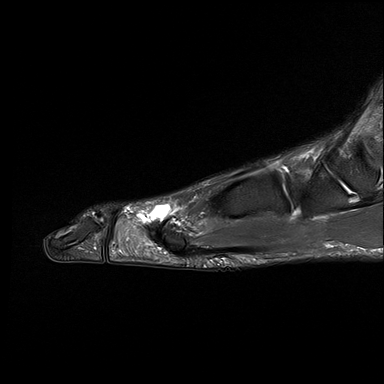
[im 29/34]
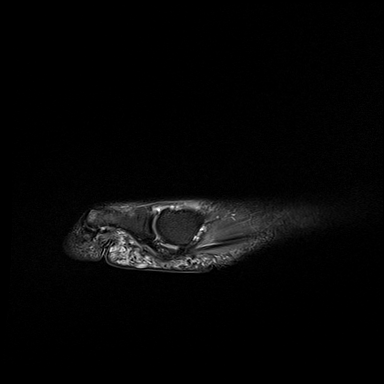
[im 34/34]
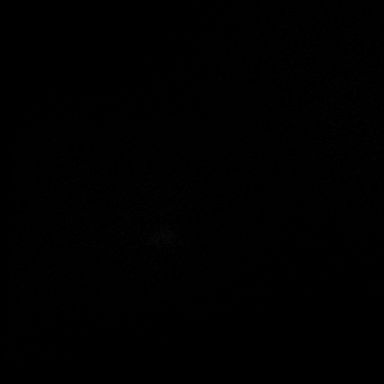

[Series 7: T1 · axial · left · 2.5mm · 0.40mm/px · 1 of 26 slices shown (2 of 2)]
[im 1/26]
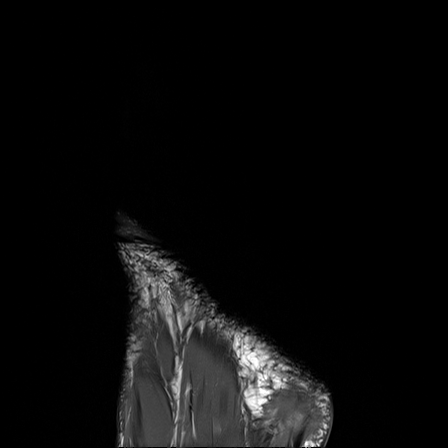

[Series 8: T2 fat-sat · axial · left · 2.5mm · 0.40mm/px · z∈[-70,-9]mm · 6 of 26 slices shown (3 of 3)]
[im 1/26]
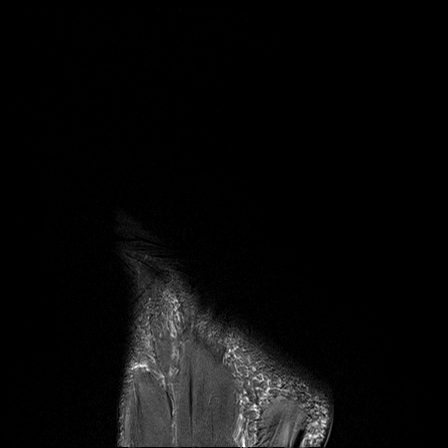
[im 6/26]
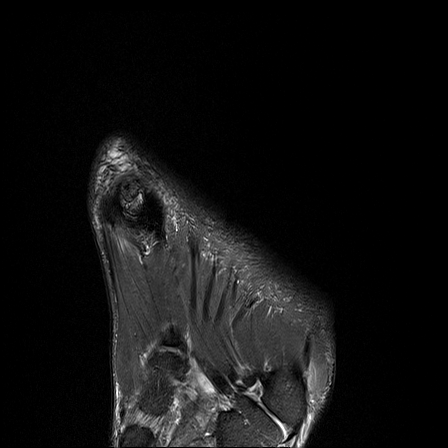
[im 11/26]
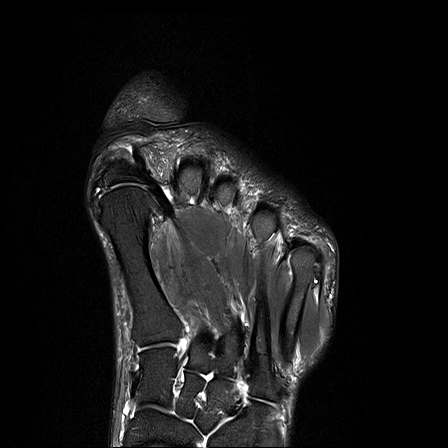
[im 16/26]
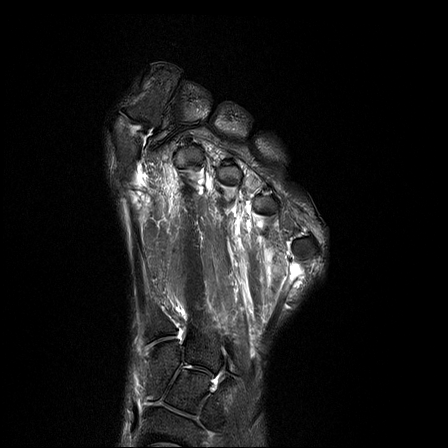
[im 21/26]
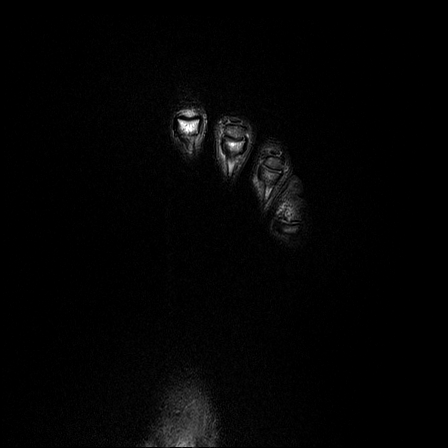
[im 26/26]
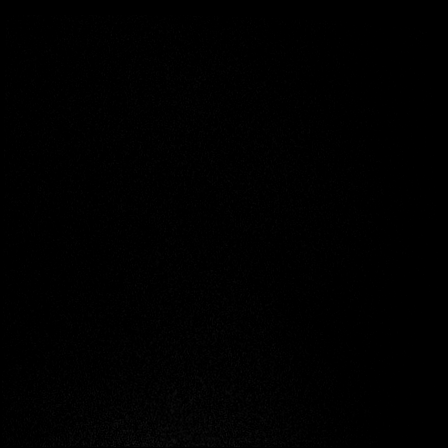

[31 of 40 positions shown; findings below may reference images not displayed]

FINDINGS: Bones/Joint/Cartilage

No acute bony abnormality is identified. The patient has first MTP
osteoarthritis with joint space narrowing and osteophytosis present.
The medial and lateral sesamoid bones are subluxed laterally. There
is mild subchondral cyst formation and edema in the medial sesamoid
and adjacent first metatarsal head. Osteophytosis is present about
the joint. The first and second toes are nearly crossing. Clawtoe
deformities are present throughout.

Ligaments

Intact.

Muscles and Tendons

Intact.

Soft tissues

A somewhat ill-defined T2 hyperintense collection which appears of
multiple septations is identified in the plantar soft tissues
subjacent to the first MTP joint. The lesion measures approximately
1.9 cm transverse by 1.1 cm craniocaudal by 3.6 cm long. No other
mass is identified. Fluid is seen in the first and third
intermetatarsal spaces.
IMPRESSION: Multi-septated T2 hyperintense collection in the plantar soft
tissues deep to the first MTP joint has an appearance most
compatible with an adventitial bursa although it cannot be
definitively characterized.

Moderately severe to severe first MTP osteoarthritis.

Fluid in the first and third intermetatarsal spaces compatible with
intermetatarsal bursitis.

Crossing first and second toes.  Clawtoe deformities throughout.

## 2019-12-26 DIAGNOSIS — H02831 Dermatochalasis of right upper eyelid: Secondary | ICD-10-CM | POA: Diagnosis not present

## 2019-12-26 DIAGNOSIS — H02834 Dermatochalasis of left upper eyelid: Secondary | ICD-10-CM | POA: Diagnosis not present

## 2019-12-26 DIAGNOSIS — H2513 Age-related nuclear cataract, bilateral: Secondary | ICD-10-CM | POA: Diagnosis not present

## 2020-03-28 ENCOUNTER — Telehealth: Payer: Self-pay | Admitting: Physician Assistant

## 2020-03-28 ENCOUNTER — Other Ambulatory Visit: Payer: Self-pay | Admitting: Physician Assistant

## 2020-03-28 DIAGNOSIS — E782 Mixed hyperlipidemia: Secondary | ICD-10-CM

## 2020-03-28 DIAGNOSIS — E781 Pure hyperglyceridemia: Secondary | ICD-10-CM

## 2020-03-28 NOTE — Telephone Encounter (Signed)
Please contact pt to schedule per last AVS for further medication refills. AS, CMA

## 2020-04-01 NOTE — Telephone Encounter (Signed)
Left voicemail letting patient know he needs to call to schedule for further med refills on 04-01-20.

## 2020-04-04 ENCOUNTER — Other Ambulatory Visit: Payer: Self-pay

## 2020-04-04 ENCOUNTER — Encounter: Payer: Self-pay | Admitting: Physician Assistant

## 2020-04-04 ENCOUNTER — Ambulatory Visit (INDEPENDENT_AMBULATORY_CARE_PROVIDER_SITE_OTHER): Payer: BC Managed Care – PPO | Admitting: Physician Assistant

## 2020-04-04 DIAGNOSIS — Z1283 Encounter for screening for malignant neoplasm of skin: Secondary | ICD-10-CM | POA: Diagnosis not present

## 2020-04-04 DIAGNOSIS — L570 Actinic keratosis: Secondary | ICD-10-CM

## 2020-04-04 DIAGNOSIS — D485 Neoplasm of uncertain behavior of skin: Secondary | ICD-10-CM

## 2020-04-04 NOTE — Progress Notes (Signed)
   Follow-Up Visit   Subjective  Keith Bullock is a 60 y.o. male who presents for the following: Annual Exam (Full body skin check. Has spots on face that patient believes are sunspots. No other concerns.).   The following portions of the chart were reviewed this encounter and updated as appropriate:  Tobacco  Allergies  Meds  Problems  Med Hx  Surg Hx  Fam Hx      Objective  Well appearing patient in no apparent distress; mood and affect are within normal limits.  A full examination was performed including scalp, head, eyes, ears, nose, lips, neck, chest, axillae, abdomen, back, buttocks, bilateral upper extremities, bilateral lower extremities, hands, feet, fingers, toes, fingernails, and toenails. All findings within normal limits unless otherwise noted below.  Objective  Head to toe: No atypical nevi   Objective  Left Tragus: Hyperkeratotic scale with pink base      Objective  Left bridge of nose, Right Parotid Area, Right Temple, Right Zygomatic Area: Erythematous patches with gritty scale.   Assessment & Plan  Screening exam for skin cancer Head to toe  Yearly skin check.  Neoplasm of uncertain behavior of skin Left Tragus  Skin / nail biopsy Type of biopsy: tangential   Informed consent: discussed and consent obtained   Timeout: patient name, date of birth, surgical site, and procedure verified   Anesthesia: the lesion was anesthetized in a standard fashion   Anesthetic:  1% lidocaine w/ epinephrine 1-100,000 local infiltration Instrument used: flexible razor blade   Hemostasis achieved with: ferric subsulfate   Outcome: patient tolerated procedure well   Post-procedure details: wound care instructions given    Specimen 1 - Surgical pathology Differential Diagnosis: R/O BCC vs SCC vs AK  Check Margins: No  AK (actinic keratosis) (4) Right Temple; Right Zygomatic Area; Right Parotid Area; Left bridge of nose  Destruction of lesion - Left bridge  of nose, Right Parotid Area, Right Temple, Right Zygomatic Area Complexity: simple   Destruction method: cryotherapy   Informed consent: discussed and consent obtained   Timeout:  patient name, date of birth, surgical site, and procedure verified Lesion destroyed using liquid nitrogen: Yes   Cryotherapy cycles:  5 Outcome: patient tolerated procedure well with no complications   Post-procedure details: wound care instructions given      I, Hlee Fringer, PA-C, have reviewed all documentation's for this visit.  The documentation on 04/04/20 for the exam, diagnosis, procedures and orders are all accurate and complete.

## 2020-04-11 ENCOUNTER — Telehealth: Payer: Self-pay

## 2020-04-11 NOTE — Telephone Encounter (Signed)
Path to patient  

## 2020-04-11 NOTE — Telephone Encounter (Signed)
-----   Message from Warren Danes, [place] sent at 04/11/2020  8:10 AM EDT ----- RTC if recurs

## 2020-06-10 ENCOUNTER — Telehealth: Payer: Self-pay | Admitting: Physician Assistant

## 2020-06-10 ENCOUNTER — Other Ambulatory Visit: Payer: Self-pay | Admitting: Physician Assistant

## 2020-06-10 DIAGNOSIS — E782 Mixed hyperlipidemia: Secondary | ICD-10-CM

## 2020-06-10 DIAGNOSIS — E781 Pure hyperglyceridemia: Secondary | ICD-10-CM

## 2020-06-10 DIAGNOSIS — I10 Essential (primary) hypertension: Secondary | ICD-10-CM

## 2020-06-10 NOTE — Telephone Encounter (Signed)
Error

## 2020-06-10 NOTE — Telephone Encounter (Signed)
Left voicemail for patient

## 2020-06-10 NOTE — Telephone Encounter (Signed)
Please contact patient to schedule appointment per last AVS for medication refills. AS, CMA

## 2020-06-19 ENCOUNTER — Other Ambulatory Visit: Payer: Self-pay

## 2020-06-19 ENCOUNTER — Encounter: Payer: Self-pay | Admitting: Physician Assistant

## 2020-06-19 ENCOUNTER — Ambulatory Visit (INDEPENDENT_AMBULATORY_CARE_PROVIDER_SITE_OTHER): Payer: BC Managed Care – PPO | Admitting: Physician Assistant

## 2020-06-19 VITALS — BP 118/79 | HR 73 | Temp 98.0°F | Ht 70.0 in | Wt 192.7 lb

## 2020-06-19 DIAGNOSIS — M25551 Pain in right hip: Secondary | ICD-10-CM | POA: Diagnosis not present

## 2020-06-19 DIAGNOSIS — Z87898 Personal history of other specified conditions: Secondary | ICD-10-CM | POA: Diagnosis not present

## 2020-06-19 DIAGNOSIS — M546 Pain in thoracic spine: Secondary | ICD-10-CM

## 2020-06-19 DIAGNOSIS — E782 Mixed hyperlipidemia: Secondary | ICD-10-CM | POA: Diagnosis not present

## 2020-06-19 DIAGNOSIS — Z8739 Personal history of other diseases of the musculoskeletal system and connective tissue: Secondary | ICD-10-CM

## 2020-06-19 DIAGNOSIS — Z87828 Personal history of other (healed) physical injury and trauma: Secondary | ICD-10-CM

## 2020-06-19 DIAGNOSIS — N521 Erectile dysfunction due to diseases classified elsewhere: Secondary | ICD-10-CM

## 2020-06-19 DIAGNOSIS — I10 Essential (primary) hypertension: Secondary | ICD-10-CM

## 2020-06-19 DIAGNOSIS — Z0000 Encounter for general adult medical examination without abnormal findings: Secondary | ICD-10-CM

## 2020-06-19 MED ORDER — TRAMADOL HCL 50 MG PO TABS: 50.0000 mg | ORAL_TABLET | Freq: Four times a day (QID) | ORAL | 0 refills | Status: DC | PRN

## 2020-06-19 MED ORDER — SILDENAFIL CITRATE 20 MG PO TABS: 60.0000 mg | ORAL_TABLET | Freq: Every day | ORAL | 0 refills | Status: DC | PRN

## 2020-06-19 MED ORDER — CYCLOBENZAPRINE HCL 10 MG PO TABS: 10.0000 mg | ORAL_TABLET | Freq: Three times a day (TID) | ORAL | 0 refills | Status: DC | PRN

## 2020-06-19 NOTE — Patient Instructions (Signed)
Hip Pain The hip is the joint between the upper legs and the lower pelvis. The bones, cartilage, tendons, and muscles of your hip joint support your body and allow you to move around. Hip pain can range from a minor ache to severe pain in one or both of your hips. The pain may be felt on the inside of the hip joint near the groin, or on the outside near the buttocks and upper thigh. You may also have swelling or stiffness in your hip area. Follow these instructions at home: Managing pain, stiffness, and swelling  If directed, put ice on the painful area. To do this: ? Put ice in a plastic bag. ? Place a towel between your skin and the bag. ? Leave the ice on for 20 minutes, 2-3 times a day.  If directed, apply heat to the affected area as often as told by your health care provider. Use the heat source that your health care provider recommends, such as a moist heat pack or a heating pad. ? Place a towel between your skin and the heat source. ? Leave the heat on for 20-30 minutes. ? Remove the heat if your skin turns bright red. This is especially important if you are unable to feel pain, heat, or cold. You may have a greater risk of getting burned.      Activity  Do exercises as told by your health care provider.  Avoid activities that cause pain. General instructions  Take over-the-counter and prescription medicines only as told by your health care provider.  Keep a journal of your symptoms. Write down: ? How often you have hip pain. ? The location of your pain. ? What the pain feels like. ? What makes the pain worse.  Sleep with a pillow between your legs on your most comfortable side.  Keep all follow-up visits as told by your health care provider. This is important.   Contact a health care provider if:  You cannot put weight on your leg.  Your pain or swelling continues or gets worse after one week.  It gets harder to walk.  You have a fever. Get help right away  if:  You fall.  You have a sudden increase in pain and swelling in your hip.  Your hip is red or swollen or very tender to touch. Summary  Hip pain can range from a minor ache to severe pain in one or both of your hips.  The pain may be felt on the inside of the hip joint near the groin, or on the outside near the buttocks and upper thigh.  Avoid activities that cause pain.  Write down how often you have hip pain, the location of the pain, what makes it worse, and what it feels like. This information is not intended to replace advice given to you by your health care provider. Make sure you discuss any questions you have with your health care provider. Document Revised: 05/23/2018 Document Reviewed: 05/23/2018 Elsevier Patient Education  2021 Elsevier Inc.  

## 2020-06-19 NOTE — Progress Notes (Signed)
Established Patient Office Visit  Subjective:  Patient ID: Keith Bullock, male    DOB: 07/13/1960  Age: 60 y.o. MRN: [id number]  CC:  Chief Complaint  Patient presents with  . Follow-up  . Hypertension  . Hyperlipidemia    HPI Keith Bullock presents for follow up on hypertension and hyperlipidemia. Patient has c/o of right hip pain for about 4 months. States cannot get comfortable at night due to the pain. Has tried Naproxen and Advil with minimal relief. Also has been using Voltaren gel which has been ineffective. Reports a fall 2 months ago which likely exacerbated the pain. Applying pressure worsens the pain such as when standing up. Does report paresthesia from thigh to knee (R). States had some tablets left of tramadol from prior rx which has provided some relief. States has been working outside in the yard and his back gave out about 10 days ago which he had to rest and recently has been having back spasms. Is requesting referral to Dr. Ninfa Linden and refill of tramadol and muscle relaxer.  HTN: Pt denies chest pain, palpitations, dizziness or edema. Taking medication as directed without side effects. Pt reports good hydration. Checks BP at home and BP readings <130/80.  HLD: Pt taking medication as directed without issues. Denies side effects including myalgias and RUQ pain. States diet consists of chicken, fish and vegetables.   ED: Takes medication when needed. Denies chest pain, palpitations, or anginal symptoms.  Past Medical History:  Diagnosis Date  . Hypertension   . MRSA cellulitis     Past Surgical History:  Procedure Laterality Date  . INSERTION OF MESH N/A 02/23/2017   Procedure: INSERTION OF MESH;  Surgeon: Ralene Ok, MD;  Location: [facility];  Service: General;  Laterality: N/A;  . ROTATOR CUFF REPAIR Left   . UMBILICAL HERNIA REPAIR N/A 02/23/2017   Procedure: LAPAROSCOPIC UMBILICAL HERNIA REPAIR WITH MESH;  Surgeon: Ralene Ok, MD;  Location: [facility] OR;   Service: General;  Laterality: N/A;    Family History  Problem Relation Age of Onset  . Cancer Mother        Ovarian  . Depression Mother   . Hypertension Mother   . Cancer Father        Colon  . Diabetes Paternal Grandfather     Social History   Socioeconomic History  . Marital status: Married    Spouse name: Not on file  . Number of children: Not on file  . Years of education: Not on file  . Highest education level: Not on file  Occupational History  . Not on file  Tobacco Use  . Smoking status: Never Smoker  . Smokeless tobacco: Never Used  Vaping Use  . Vaping Use: Never used  Substance and Sexual Activity  . Alcohol use: Yes    Comment: 2-3 a day  . Drug use: No  . Sexual activity: Yes  Other Topics Concern  . Not on file  Social History Narrative  . Not on file   Social Determinants of Health   Financial Resource Strain: Not on file  Food Insecurity: Not on file  Transportation Needs: Not on file  Physical Activity: Not on file  Stress: Not on file  Social Connections: Not on file  Intimate Partner Violence: Not on file    Outpatient Medications Prior to Visit  Medication Sig Dispense Refill  . amLODipine (NORVASC) 10 MG tablet **NEEDS APT FOR REFILLS** TAKE 1 TABLET BY MOUTH DAILY. 30 tablet  0  . Biotin 5000 MCG TABS Take 1 tablet by mouth daily.    . cetirizine (ZYRTEC) 10 MG tablet Take 10 mg by mouth daily as needed for allergies.    . Cyanocobalamin (B-12) 500 MCG TABS Take 1 tablet by mouth daily.    Marland Kitchen ezetimibe (ZETIA) 10 MG tablet TAKE 1 TABLET BY MOUTH AT BEDTIME.**NEEDS APT FOR REFILLS** 30 tablet 0  . Famotidine-Ca Carb-Mag Hydrox (PEPCID COMPLETE PO) Take 1 tablet by mouth daily as needed (acid reflux).    . Multiple Vitamin (MULTIVITAMIN WITH MINERALS) TABS tablet Take 1 tablet by mouth daily.    . Naphazoline-Pheniramine 0.027-0.315 % SOLN Apply 1 drop to eye daily as needed (dry eyes).    . naproxen sodium (ANAPROX) 220 MG tablet Take  220 mg by mouth daily as needed (pain).     Marland Kitchen omega-3 acid ethyl esters (LOVAZA) 1 g capsule Take 2 capsules (2 g total) by mouth 2 (two) times daily. 360 capsule 0  . valsartan-hydrochlorothiazide (DIOVAN-HCT) 320-25 MG tablet Take 1 tablet by mouth daily. **NEEDS APT FOR REFILLS** 30 tablet 0  . cyclobenzaprine (FLEXERIL) 10 MG tablet Take 1 tablet (10 mg total) by mouth 3 (three) times daily as needed for muscle spasms. 30 tablet 0  . sildenafil (REVATIO) 20 MG tablet Take 3 tablets (60 mg total) by mouth daily as needed (erectile dysfunction). 30 tablet 0  . traMADol (ULTRAM) 50 MG tablet Take 1 tablet (50 mg total) by mouth every 6 (six) hours as needed. 30 tablet 0   No facility-administered medications prior to visit.    Allergies  Allergen Reactions  . Flomax [Tamsulosin Hcl] Anxiety    ROS Review of Systems Review of Systems:  A fourteen system review of systems was performed and found to be positive as per HPI.   Objective:    Physical Exam General:  Well Developed, well nourished, appropriate for stated age.  Neuro:  Alert and oriented,  extra-ocular muscles intact  HEENT:  Normocephalic, atraumatic, neck supple, no carotid bruits appreciated  Skin:  no gross rash, warm, pink. Cardiac:  RRR, S1 S2 Respiratory:  ECTA B/L, Not using accessory muscles, speaking in full sentences- unlabored. MSK: No step-off noted, tenderness of lumbar paraspinal muscles and posterolateral hip area, good ROM. Vascular:  Ext warm, no cyanosis apprec.; cap RF less 2 sec. Psych:  No HI/SI, judgement and insight good, Euthymic mood. Full Affect.   BP 118/79   Pulse 73   Temp 98 F (36.7 C)   Ht $R'5\' 10"'OS$  (1.778 m)   Wt 192 lb 11.2 oz (87.4 kg)   SpO2 98%   BMI 27.65 kg/m  Wt Readings from Last 3 Encounters:  06/19/20 192 lb 11.2 oz (87.4 kg)  09/13/19 208 lb 14.4 oz (94.8 kg)  04/18/19 213 lb 3.2 oz (96.7 kg)     Health Maintenance Due  Topic Date Due  . Pneumococcal Vaccine 71-65  Years old (1 of 4 - PCV13) Never done  . COVID-19 Vaccine (4 - Booster for Pfizer series) 01/23/2020    There are no preventive care reminders to display for this patient.  Lab Results  Component Value Date   TSH 3.200 09/13/2019   Lab Results  Component Value Date   WBC 3.4 06/19/2020   HGB 14.3 06/19/2020   HCT 40.6 06/19/2020   MCV 94 06/19/2020   PLT 240 06/19/2020   Lab Results  Component Value Date   NA 134 06/19/2020   K 4.1  06/19/2020   CO2 20 06/19/2020   GLUCOSE 98 06/19/2020   BUN 12 06/19/2020   CREATININE 0.88 06/19/2020   BILITOT 0.7 06/19/2020   ALKPHOS 69 06/19/2020   AST 137 (H) 06/19/2020   ALT 88 (H) 06/19/2020   PROT 6.7 06/19/2020   ALBUMIN 4.2 06/19/2020   CALCIUM 9.5 06/19/2020   ANIONGAP 9 02/18/2017   EGFR 99 06/19/2020   Lab Results  Component Value Date   CHOL 168 06/19/2020   Lab Results  Component Value Date   HDL 51 06/19/2020   Lab Results  Component Value Date   LDLCALC 89 06/19/2020   Lab Results  Component Value Date   TRIG 163 (H) 06/19/2020   Lab Results  Component Value Date   CHOLHDL 3.3 06/19/2020   Lab Results  Component Value Date   HGBA1C 5.2 06/19/2020      Assessment & Plan:   Problem List Items Addressed This Visit      Cardiovascular and Mediastinum   Essential hypertension - Primary (Chronic)   Relevant Medications   sildenafil (REVATIO) 20 MG tablet   Other Relevant Orders   Comp Met (CMET) (Completed)   CBC w/Diff (Completed)     Other   HLD (hyperlipidemia) (Chronic)   Relevant Medications   sildenafil (REVATIO) 20 MG tablet   Other Relevant Orders   Comp Met (CMET) (Completed)   CBC w/Diff (Completed)   Lipid Profile (Completed)   ED (erectile dysfunction) (Chronic)   Relevant Medications   sildenafil (REVATIO) 20 MG tablet   H/o back strain - M-SK and rad to sides/ front (Chronic)   Relevant Medications   cyclobenzaprine (FLEXERIL) 10 MG tablet   traMADol (ULTRAM) 50 MG tablet     Other Visit Diagnoses    Right hip pain       Relevant Medications   traMADol (ULTRAM) 50 MG tablet   Other Relevant Orders   Ambulatory referral to Orthopedic Surgery   Healthcare maintenance       History of prediabetes       Relevant Orders   HgB A1c (Completed)      Essential hypertension: -Controlled. -Continue current medication regimen.  Will collect CMP for medication monitoring. -Follow low-sodium diet and stay well-hydrated. -Will continue to monitor.  Hyperlipidemia: -Last lipid panel: Total cholesterol 173, triglycerides 108, HDL 60, LDL 94 -Recommend to continue Zetia.  Follow a heart healthy diet. -Patient is fasting today so we will repeat lipid panel and hepatic function.  ED: -Stable. -Continue current medication regimen.  Provided refill.  Right hip pain, h/o back strain: -Will place referral to orthopedics (Dr. Jean Rosenthal). -PDMP reviewed, no prior records found.  Will provide a one-time refill for tramadol. -Use muscle relaxer as needed and recommend cautious use due to drowsiness.  Health Maintenance: -Patient is fasting today so we will collect fasting labs including A1c due to history of prediabetes. -Advised to schedule CPE at next office visit. -Patient will be due for colonoscopy 10/2020.  Meds ordered this encounter  Medications  . cyclobenzaprine (FLEXERIL) 10 MG tablet    Sig: Take 1 tablet (10 mg total) by mouth 3 (three) times daily as needed for muscle spasms.    Dispense:  30 tablet    Refill:  0    Order Specific Question:   Supervising Provider    Answer:   Beatrice Lecher D [[id number]]  . traMADol (ULTRAM) 50 MG tablet    Sig: Take 1 tablet (50 mg total) by mouth  every 6 (six) hours as needed.    Dispense:  30 tablet    Refill:  0    Order Specific Question:   Supervising Provider    Answer:   Beatrice Lecher D [[id number]]  . sildenafil (REVATIO) 20 MG tablet    Sig: Take 3 tablets (60 mg total) by mouth daily as  needed (erectile dysfunction).    Dispense:  30 tablet    Refill:  0    Order Specific Question:   Supervising Provider    Answer:   Beatrice Lecher D [[id number]]    Follow-up: Return in about 6 months (around 12/19/2020) for CPE .   Note:  This note was prepared with assistance of Dragon voice recognition software. Occasional wrong-word or sound-a-like substitutions may have occurred due to the inherent limitations of voice recognition software.  Lorrene Reid, PA-C

## 2020-06-20 LAB — CBC WITH DIFFERENTIAL/PLATELET
Basophils Absolute: 0.1 10*3/uL (ref 0.0–0.2)
Basos: 2 %
EOS (ABSOLUTE): 0.1 10*3/uL (ref 0.0–0.4)
Eos: 2 %
Hematocrit: 40.6 % (ref 37.5–51.0)
Hemoglobin: 14.3 g/dL (ref 13.0–17.7)
Immature Grans (Abs): 0 10*3/uL (ref 0.0–0.1)
Immature Granulocytes: 0 %
Lymphocytes Absolute: 1.1 10*3/uL (ref 0.7–3.1)
Lymphs: 33 %
MCH: 33.3 pg — ABNORMAL HIGH (ref 26.6–33.0)
MCHC: 35.2 g/dL (ref 31.5–35.7)
MCV: 94 fL (ref 79–97)
Monocytes Absolute: 0.5 10*3/uL (ref 0.1–0.9)
Monocytes: 14 %
Neutrophils Absolute: 1.7 10*3/uL (ref 1.4–7.0)
Neutrophils: 49 %
Platelets: 240 10*3/uL (ref 150–450)
RBC: 4.3 x10E6/uL (ref 4.14–5.80)
RDW: 12 % (ref 11.6–15.4)
WBC: 3.4 10*3/uL (ref 3.4–10.8)

## 2020-06-20 LAB — COMPREHENSIVE METABOLIC PANEL
ALT: 88 IU/L — ABNORMAL HIGH (ref 0–44)
AST: 137 IU/L — ABNORMAL HIGH (ref 0–40)
Albumin/Globulin Ratio: 1.7 (ref 1.2–2.2)
Albumin: 4.2 g/dL (ref 3.8–4.9)
Alkaline Phosphatase: 69 IU/L (ref 44–121)
BUN/Creatinine Ratio: 14 (ref 9–20)
BUN: 12 mg/dL (ref 6–24)
Bilirubin Total: 0.7 mg/dL (ref 0.0–1.2)
CO2: 20 mmol/L (ref 20–29)
Calcium: 9.5 mg/dL (ref 8.7–10.2)
Chloride: 96 mmol/L (ref 96–106)
Creatinine, Ser: 0.88 mg/dL (ref 0.76–1.27)
Globulin, Total: 2.5 g/dL (ref 1.5–4.5)
Glucose: 98 mg/dL (ref 65–99)
Potassium: 4.1 mmol/L (ref 3.5–5.2)
Sodium: 134 mmol/L (ref 134–144)
Total Protein: 6.7 g/dL (ref 6.0–8.5)
eGFR: 99 mL/min/{1.73_m2} (ref >59)

## 2020-06-20 LAB — HEMOGLOBIN A1C
Est. average glucose Bld gHb Est-mCnc: 103 mg/dL
Hgb A1c MFr Bld: 5.2 % (ref 4.8–5.6)

## 2020-06-20 LAB — LIPID PANEL
Chol/HDL Ratio: 3.3 ratio (ref 0.0–5.0)
Cholesterol, Total: 168 mg/dL (ref 100–199)
HDL: 51 mg/dL (ref >39)
LDL Chol Calc (NIH): 89 mg/dL (ref 0–99)
Triglycerides: 163 mg/dL — ABNORMAL HIGH (ref 0–149)
VLDL Cholesterol Cal: 28 mg/dL (ref 5–40)

## 2020-06-27 ENCOUNTER — Ambulatory Visit (INDEPENDENT_AMBULATORY_CARE_PROVIDER_SITE_OTHER): Payer: BC Managed Care – PPO | Admitting: Orthopaedic Surgery

## 2020-06-27 ENCOUNTER — Ambulatory Visit: Payer: Self-pay

## 2020-06-27 ENCOUNTER — Encounter: Payer: Self-pay | Admitting: Orthopaedic Surgery

## 2020-06-27 DIAGNOSIS — M7061 Trochanteric bursitis, right hip: Secondary | ICD-10-CM

## 2020-06-27 DIAGNOSIS — M25551 Pain in right hip: Secondary | ICD-10-CM

## 2020-06-27 MED ORDER — LIDOCAINE HCL 1 % IJ SOLN
3.0000 mL | INTRAMUSCULAR | Status: AC | PRN
  Administered 2020-06-27: 3 mL

## 2020-06-27 MED ORDER — METHYLPREDNISOLONE ACETATE 40 MG/ML IJ SUSP
40.0000 mg | INTRAMUSCULAR | Status: AC | PRN
  Administered 2020-06-27: 40 mg via INTRA_ARTICULAR

## 2020-06-27 NOTE — Progress Notes (Signed)
Office Visit Note   Patient: Keith Bullock           Date of Birth: 1960/12/29           MRN: [id number] Visit Date: 06/27/2020              Requested by: Lorrene Reid, PA-C [address],  [place] [address] PCP: Lorrene Reid, PA-C   Assessment & Plan: Visit Diagnoses:  1. Pain in right hip   2. Trochanteric bursitis, right hip     Plan: His signs and symptoms and clinical exam seem to be consistent more so with right hip trochanteric bursitis.  I talked him about trying a steroid injection over this right hip trochanteric area and described the rationale behind an injection such as this as well as the risk and benefits involved.  He did wish to proceed with this type of injection.  He tolerated well.  All questions and concerns were answered and addressed.  I showed him stretching exercises to try which she will start tomorrow.  I told him to call us if things or not getting better.  My neck step would be formal physical therapy.  Follow-Up Instructions: Return if symptoms worsen or fail to improve.   Orders:  Orders Placed This Encounter  Procedures   Large Joint Inj   XR HIP UNILAT W OR W/O PELVIS 2-3 VIEWS RIGHT   No orders of the defined types were placed in this encounter.     Procedures: Large Joint Inj: R greater trochanter on 06/27/2020 2:55 PM Indications: pain and diagnostic evaluation Details: 22 G 1.5 in needle, lateral approach  Arthrogram: No  Medications: 3 mL lidocaine 1 %; 40 mg methylPREDNISolone acetate 40 MG/ML Outcome: tolerated well, no immediate complications Procedure, treatment alternatives, risks and benefits explained, specific risks discussed. Consent was given by the patient. Immediately prior to procedure a time out was called to verify the correct patient, procedure, equipment, support staff and site/side marked as required. Patient was prepped and draped in the usual sterile fashion.      Clinical Data: No  additional findings.   Subjective: Chief Complaint  Patient presents with   Right Hip - Pain  Patient is a very pleasant 60 year old gentleman who comes in with 4 months worth of right hip pain he points to lateral aspect of his hip as a source of his pain.  He then fell about 6 weeks ago going up a steep hill he has been hurting since then as well but he was hurting before the fall.  He says it hurts to lay or sit on that side but once he walks around he does not have much pain.  It does radiate down to the knee.  There is no groin pain at all.  Has been trying Aleve and Advil and has had some tramadol which he takes for back pain and that is helped some as well.  He has never had surgery on this side.  He is not a diabetic.  He is a thin and active individual  HPI  Review of Systems .  There is currently listed no headache, chest pain, shortness of breath, fever, chills, nausea, vomiting  Objective: Vital Signs: There were no vitals taken for this visit.  Physical Exam He is alert and orient x3 and in no acute distress Ortho Exam Examination of his right hip shows it moves smoothly and fluidly with full rotation and no pain in the  groin at all.  When I have him lay in a lateral position his right hip hurts over the trochanteric area and the IT band.  His leg lengths are equal.  His left hip exam is normal. Specialty Comments:  No specialty comments available.  Imaging: XR HIP UNILAT W OR W/O PELVIS 2-3 VIEWS RIGHT  Result Date: 06/27/2020 An AP pelvis and lateral of the right hip shows no acute findings.  There is slight evidence of femoral acetabular impingement with osteophyte of the lateral femoral head neck on both sides.  There is slight joint space narrowing on the right side.    PMFS History: Patient Active Problem List   Diagnosis Date Noted   Transaminitis 06/01/2018   Adjustment disorder with anxious mood 06/01/2018   Glucose intolerance (impaired glucose tolerance)  06/08/2017   High risk medication use-  Accutane from Derm 06/08/2017   Vitamin D deficiency 06/08/2017   Chronic periumbilical pain with hernia 12/14/2016   Irreducible umbilical hernia 41/63/8453   Callus of foot 12/14/2016   Elevated fasting blood sugar- 08/2016 w derm labs 12/14/2016   Elevated LFTs 08/12/2016   Elevated PSA measurement 01/07/2016   Hypertriglyceridemia 01/07/2016   HLD (hyperlipidemia) 01/07/2016   Decreased libido 01/07/2016   ED (erectile dysfunction) 01/07/2016   H/o back strain - M-SK and rad to sides/ front 01/07/2016   Myalgia due to statin 01/07/2016   Excessive drinking of alcohol 01/07/2016   Obesity, Class I, BMI 30-34.9 01/07/2016   Essential hypertension 08/26/2015   h/o recent MRSA infection 08/26/2015   Past Medical History:  Diagnosis Date   Hypertension    MRSA cellulitis     Family History  Problem Relation Age of Onset   Cancer Mother        Ovarian   Depression Mother    Hypertension Mother    Cancer Father        Colon   Diabetes Paternal Grandfather     Past Surgical History:  Procedure Laterality Date   INSERTION OF MESH N/A 02/23/2017   Procedure: INSERTION OF MESH;  Surgeon: Ralene Ok, MD;  Location: [facility];  Service: General;  Laterality: N/A;   ROTATOR CUFF REPAIR Left    UMBILICAL HERNIA REPAIR N/A 02/23/2017   Procedure: LAPAROSCOPIC UMBILICAL HERNIA REPAIR WITH MESH;  Surgeon: Ralene Ok, MD;  Location: [facility] OR;  Service: General;  Laterality: N/A;   Social History   Occupational History   Not on file  Tobacco Use   Smoking status: Never   Smokeless tobacco: Never  Vaping Use   Vaping Use: Never used  Substance and Sexual Activity   Alcohol use: Yes    Comment: 2-3 a day   Drug use: No   Sexual activity: Yes

## 2020-09-17 ENCOUNTER — Other Ambulatory Visit: Payer: Self-pay | Admitting: Physician Assistant

## 2020-09-17 DIAGNOSIS — E781 Pure hyperglyceridemia: Secondary | ICD-10-CM

## 2020-09-17 DIAGNOSIS — I10 Essential (primary) hypertension: Secondary | ICD-10-CM

## 2020-09-17 DIAGNOSIS — E782 Mixed hyperlipidemia: Secondary | ICD-10-CM

## 2020-09-24 NOTE — Progress Notes (Signed)
[place] Phone: [phone] Subjective:   Fontaine No, am serving as a scribe for Dr. Hulan Saas.  This visit occurred during the SARS-CoV-2 public health emergency.  Safety protocols were in place, including screening questions prior to the visit, additional usage of staff PPE, and extensive cleaning of exam room while observing appropriate contact time as indicated for disinfecting solutions.   I'm seeing this patient by the request  of:  Lorrene Reid, PA-C  CC: Low back and hip pain   [id number]  Keith Bullock is a 60 y.o. male coming in with complaint of R hip pain since a fall in April. Patient has been seen by Dr. Rush Farmer for GT bursitis in June and given injection. Patient states that pain subsided for a period of time but continues to have intermittent pain especially when lying on that side at night.  Patient states that he continues to have some some discomfort at this point that is affecting his sleep.  Has intermittent lumbar and thoracic spine pain from golf. Does not strengthen and does stretch at times. Has lost 20# since January after having COVID and states that appetite has not returned.   Did have some shoulder pain after the fall but that pain has subsided.  History of RTC surgery in 2019.    Xray R hip 2022 An AP pelvis and lateral of the right hip shows no acute findings.  There  is slight evidence of femoral acetabular impingement with osteophyte of  the lateral femoral head neck on both sides.  There is slight joint space  narrowing on the right side.  Past Medical History:  Diagnosis Date   Hypertension    MRSA cellulitis    Past Surgical History:  Procedure Laterality Date   INSERTION OF MESH N/A 02/23/2017   Procedure: INSERTION OF MESH;  Surgeon: Ralene Ok, MD;  Location: [place];  Service: General;  Laterality: N/A;   ROTATOR CUFF REPAIR Left    UMBILICAL  HERNIA REPAIR N/A 02/23/2017   Procedure: LAPAROSCOPIC UMBILICAL HERNIA REPAIR WITH MESH;  Surgeon: Ralene Ok, MD;  Location: [place];  Service: General;  Laterality: N/A;   Social History   Socioeconomic History   Marital status: Married    Spouse name: Not on file   Number of children: Not on file   Years of education: Not on file   Highest education level: Not on file  Occupational History   Not on file  Tobacco Use   Smoking status: Never   Smokeless tobacco: Never  Vaping Use   Vaping Use: Never used  Substance and Sexual Activity   Alcohol use: Yes    Comment: 2-3 a day   Drug use: No   Sexual activity: Yes  Other Topics Concern   Not on file  Social History Narrative   Not on file   Social Determinants of Health   Financial Resource Strain: Not on file  Food Insecurity: Not on file  Transportation Needs: Not on file  Physical Activity: Not on file  Stress: Not on file  Social Connections: Not on file   Allergies  Allergen Reactions   Flomax [Tamsulosin Hcl] Anxiety   Family History  Problem Relation Age of Onset   Cancer Mother        Ovarian   Depression Mother    Hypertension Mother    Cancer Father        Colon  Diabetes Paternal Grandfather      Current Outpatient Medications (Cardiovascular):    amLODipine (NORVASC) 10 MG tablet, TAKE 1 TABLET BY MOUTH ONCE DAILY   ezetimibe (ZETIA) 10 MG tablet, TAKE 1 TABLET BY MOUTH AT BEDTIME.   omega-3 acid ethyl esters (LOVAZA) 1 g capsule, Take 2 capsules (2 g total) by mouth 2 (two) times daily.   sildenafil (REVATIO) 20 MG tablet, Take 3 tablets (60 mg total) by mouth daily as needed (erectile dysfunction).   valsartan-hydrochlorothiazide (DIOVAN-HCT) 320-25 MG tablet, Take 1 tablet by mouth daily.  Current Outpatient Medications (Respiratory):    cetirizine (ZYRTEC) 10 MG tablet, Take 10 mg by mouth daily as needed for allergies.  Current Outpatient Medications (Analgesics):    naproxen sodium  (ANAPROX) 220 MG tablet, Take 220 mg by mouth daily as needed (pain).    traMADol (ULTRAM) 50 MG tablet, Take 1 tablet (50 mg total) by mouth every 6 (six) hours as needed.  Current Outpatient Medications (Hematological):    Cyanocobalamin (B-12) 500 MCG TABS, Take 1 tablet by mouth daily.  Current Outpatient Medications (Other):    Biotin 5000 MCG TABS, Take 1 tablet by mouth daily.   cyclobenzaprine (FLEXERIL) 10 MG tablet, Take 1 tablet (10 mg total) by mouth 3 (three) times daily as needed for muscle spasms.   Famotidine-Ca Carb-Mag Hydrox (PEPCID COMPLETE PO), Take 1 tablet by mouth daily as needed (acid reflux).   gabapentin (NEURONTIN) 100 MG capsule, Take 2 capsules (200 mg total) by mouth at bedtime.   hydrOXYzine (ATARAX/VISTARIL) 10 MG tablet, Take 1 tablet (10 mg total) by mouth 3 (three) times daily as needed.   Multiple Vitamin (MULTIVITAMIN WITH MINERALS) TABS tablet, Take 1 tablet by mouth daily.   Naphazoline-Pheniramine 0.027-0.315 % SOLN, Apply 1 drop to eye daily as needed (dry eyes).   Reviewed prior external information including notes and imaging from  primary care provider As well as notes that were available from care everywhere and other healthcare systems.  Past medical history, social, surgical and family history all reviewed in electronic medical record.  No pertanent information unless stated regarding to the chief complaint.   Review of Systems:  No headache, visual changes, nausea, vomiting, diarrhea, constipation, dizziness, abdominal pain, skin rash, fevers, chills, night sweats, weight loss, swollen lymph nodes,  joint swelling, chest pain, shortness of breath, mood changes. POSITIVE muscle aches, body aches  Objective  Blood pressure 118/84, pulse 88, height '5\' 10"'$  (1.778 m), weight 186 lb (84.4 kg), SpO2 97 %.   General: No apparent distress alert and oriented x3 mood and affect normal, dressed appropriately.  HEENT: Pupils equal, extraocular  movements intact  Respiratory: Patient's speak in full sentences and does not appear short of breath  Cardiovascular: No lower extremity edema, non tender, no erythema  Gait normal with good balance and coordination.  MSK: Low back exam does have some loss of lordosis.  Some tenderness to palpation in the paraspinal musculature.  Patient has some atrophy of the extremities noted of the musculature. Tender to palpation of the greater trochanteric area right and left.  Negative straight leg test but does have tightness of the hamstring.   [id number]; 15 additional minutes spent for Therapeutic exercises as stated in above notes.  This included exercises focusing on stretching, strengthening, with significant focus on eccentric aspects.   Long term goals include an improvement in range of motion, strength, endurance as well as avoiding reinjury. Patient's frequency would include in 1-2 times a day,  3-5 times a week for a duration of 6-12 weeks. Low back exercises that included:  Pelvic tilt/bracing instruction to focus on control of the pelvic girdle and lower abdominal muscles  Glute strengthening exercises, focusing on proper firing of the glutes without engaging the low back muscles Proper stretching techniques for maximum relief for the hamstrings, hip flexors, low back and some rotation where tolerated  Proper technique shown and discussed handout in great detail with ATC.  All questions were discussed and answered.     Impression and Recommendations:     The above documentation has been reviewed and is accurate and complete Lyndal Pulley, DO

## 2020-09-26 ENCOUNTER — Ambulatory Visit (INDEPENDENT_AMBULATORY_CARE_PROVIDER_SITE_OTHER): Payer: BC Managed Care – PPO | Admitting: Family Medicine

## 2020-09-26 ENCOUNTER — Encounter: Payer: Self-pay | Admitting: Family Medicine

## 2020-09-26 ENCOUNTER — Ambulatory Visit (INDEPENDENT_AMBULATORY_CARE_PROVIDER_SITE_OTHER): Payer: BC Managed Care – PPO

## 2020-09-26 ENCOUNTER — Other Ambulatory Visit: Payer: Self-pay

## 2020-09-26 ENCOUNTER — Ambulatory Visit: Payer: Self-pay

## 2020-09-26 VITALS — BP 118/84 | HR 88 | Ht 70.0 in | Wt 186.0 lb

## 2020-09-26 DIAGNOSIS — M545 Low back pain, unspecified: Secondary | ICD-10-CM

## 2020-09-26 DIAGNOSIS — M2550 Pain in unspecified joint: Secondary | ICD-10-CM

## 2020-09-26 DIAGNOSIS — M25551 Pain in right hip: Secondary | ICD-10-CM

## 2020-09-26 DIAGNOSIS — F4322 Adjustment disorder with anxiety: Secondary | ICD-10-CM

## 2020-09-26 DIAGNOSIS — F1010 Alcohol abuse, uncomplicated: Secondary | ICD-10-CM

## 2020-09-26 DIAGNOSIS — Z87828 Personal history of other (healed) physical injury and trauma: Secondary | ICD-10-CM

## 2020-09-26 DIAGNOSIS — F32A Depression, unspecified: Secondary | ICD-10-CM | POA: Diagnosis not present

## 2020-09-26 DIAGNOSIS — Z8739 Personal history of other diseases of the musculoskeletal system and connective tissue: Secondary | ICD-10-CM

## 2020-09-26 DIAGNOSIS — R7401 Elevation of levels of liver transaminase levels: Secondary | ICD-10-CM

## 2020-09-26 LAB — TSH: TSH: 2.47 u[IU]/mL (ref 0.35–5.50)

## 2020-09-26 LAB — CBC WITH DIFFERENTIAL/PLATELET
Basophils Absolute: 0.1 10*3/uL (ref 0.0–0.1)
Basophils Relative: 1.7 % (ref 0.0–3.0)
Eosinophils Absolute: 0.1 10*3/uL (ref 0.0–0.7)
Eosinophils Relative: 1.5 % (ref 0.0–5.0)
HCT: 40.8 % (ref 39.0–52.0)
Hemoglobin: 14.1 g/dL (ref 13.0–17.0)
Lymphocytes Relative: 26.6 % (ref 12.0–46.0)
Lymphs Abs: 1.1 10*3/uL (ref 0.7–4.0)
MCHC: 34.5 g/dL (ref 30.0–36.0)
MCV: 98.6 fl (ref 78.0–100.0)
Monocytes Absolute: 0.6 10*3/uL (ref 0.1–1.0)
Monocytes Relative: 13.8 % — ABNORMAL HIGH (ref 3.0–12.0)
Neutro Abs: 2.4 10*3/uL (ref 1.4–7.7)
Neutrophils Relative %: 56.4 % (ref 43.0–77.0)
Platelets: 237 10*3/uL (ref 150.0–400.0)
RBC: 4.14 Mil/uL — ABNORMAL LOW (ref 4.22–5.81)
RDW: 13.1 % (ref 11.5–15.5)
WBC: 4.3 10*3/uL (ref 4.0–10.5)

## 2020-09-26 LAB — COMPREHENSIVE METABOLIC PANEL
ALT: 65 U/L — ABNORMAL HIGH (ref 0–53)
AST: 110 U/L — ABNORMAL HIGH (ref 0–37)
Albumin: 4.3 g/dL (ref 3.5–5.2)
Alkaline Phosphatase: 66 U/L (ref 39–117)
BUN: 6 mg/dL (ref 6–23)
CO2: 26 mEq/L (ref 19–32)
Calcium: 9.3 mg/dL (ref 8.4–10.5)
Chloride: 90 mEq/L — ABNORMAL LOW (ref 96–112)
Creatinine, Ser: 0.93 mg/dL (ref 0.40–1.50)
GFR: 89.53 mL/min (ref >60.00)
Glucose, Bld: 108 mg/dL — ABNORMAL HIGH (ref 70–99)
Potassium: 3.6 mEq/L (ref 3.5–5.1)
Sodium: 127 mEq/L — ABNORMAL LOW (ref 135–145)
Total Bilirubin: 1.2 mg/dL (ref 0.2–1.2)
Total Protein: 7.1 g/dL (ref 6.0–8.3)

## 2020-09-26 LAB — IBC PANEL
Iron: 175 ug/dL — ABNORMAL HIGH (ref 42–165)
Saturation Ratios: 51 % — ABNORMAL HIGH (ref 20.0–50.0)
TIBC: 343 ug/dL (ref 250.0–450.0)
Transferrin: 245 mg/dL (ref 212.0–360.0)

## 2020-09-26 LAB — FERRITIN: Ferritin: 1333.1 ng/mL — ABNORMAL HIGH (ref 22.0–322.0)

## 2020-09-26 LAB — URIC ACID: Uric Acid, Serum: 6.6 mg/dL (ref 4.0–7.8)

## 2020-09-26 LAB — PROTIME-INR
INR: 1.1 ratio — ABNORMAL HIGH (ref 0.8–1.0)
Prothrombin Time: 11.6 s (ref 9.6–13.1)

## 2020-09-26 LAB — SEDIMENTATION RATE: Sed Rate: 14 mm/hr (ref 0–20)

## 2020-09-26 LAB — VITAMIN D 25 HYDROXY (VIT D DEFICIENCY, FRACTURES): VITD: 71.6 ng/mL (ref 30.00–100.00)

## 2020-09-26 LAB — TESTOSTERONE: Testosterone: 379.51 ng/dL (ref 300.00–890.00)

## 2020-09-26 MED ORDER — GABAPENTIN 100 MG PO CAPS: 200.0000 mg | ORAL_CAPSULE | Freq: Every day | ORAL | 0 refills | Status: DC

## 2020-09-26 MED ORDER — HYDROXYZINE HCL 10 MG PO TABS: 10.0000 mg | ORAL_TABLET | Freq: Three times a day (TID) | ORAL | 0 refills | Status: DC | PRN

## 2020-09-26 NOTE — Patient Instructions (Addendum)
Xray today Labs today Gabapentin '200mg'$  at night Hydroxizine '10mg'$   Behavioral heath will call you Dr. Ronnald Ramp upstairs for PCP See me in 4 weeks

## 2020-09-26 NOTE — Assessment & Plan Note (Signed)
I believe the patient does have some anxiety as well as likely some underlying depression.  Will refer patient to behavioral health.  Given some hydroxyzine to see how patient would respond.  Patient has been self-medicating somewhat with alcohol and warned of potential side effects with the gabapentin on board as well.  Patient has had elevated transaminitis and we will recheck labs today to make sure patient is doing relatively well and see how patient's liver is functioning.  Follow-up with me again in 4 weeks otherwise.

## 2020-09-26 NOTE — Assessment & Plan Note (Signed)
This has been a longstanding chronic problem.  We will recheck labs for the transaminitis as well as to rule out anything such as hepatocellular carcinoma.

## 2020-09-26 NOTE — Assessment & Plan Note (Signed)
Concerned that some of patient's hip pain seems to be secondary to more of back radicular symptoms as well.  We will get x-rays to further evaluate.  X-rays of the hip previously were independently visualized by me showing no significant bony abnormality at the moment.  Started on low-dose of gabapentin to help him with some sleep as well.  Follow-up with me again 4 weeks

## 2020-09-29 ENCOUNTER — Encounter: Payer: Self-pay | Admitting: Family Medicine

## 2020-09-30 ENCOUNTER — Telehealth: Payer: Self-pay | Admitting: Hematology and Oncology

## 2020-09-30 ENCOUNTER — Other Ambulatory Visit: Payer: Self-pay

## 2020-09-30 DIAGNOSIS — Q859 Phakomatosis, unspecified: Secondary | ICD-10-CM

## 2020-09-30 MED ORDER — HYDROXYZINE HCL 10 MG PO TABS: 10.0000 mg | ORAL_TABLET | Freq: Three times a day (TID) | ORAL | 0 refills | Status: DC | PRN

## 2020-09-30 NOTE — Telephone Encounter (Signed)
Scheduled appt per 9/12 referral. Pt is aware of appt date and time.

## 2020-10-03 ENCOUNTER — Encounter: Payer: Self-pay | Admitting: Hematology and Oncology

## 2020-10-03 ENCOUNTER — Other Ambulatory Visit: Payer: Self-pay

## 2020-10-03 ENCOUNTER — Inpatient Hospital Stay: Payer: BC Managed Care – PPO

## 2020-10-03 ENCOUNTER — Inpatient Hospital Stay: Payer: BC Managed Care – PPO | Attending: Hematology and Oncology | Admitting: Hematology and Oncology

## 2020-10-03 DIAGNOSIS — I10 Essential (primary) hypertension: Secondary | ICD-10-CM | POA: Insufficient documentation

## 2020-10-03 DIAGNOSIS — M48061 Spinal stenosis, lumbar region without neurogenic claudication: Secondary | ICD-10-CM | POA: Insufficient documentation

## 2020-10-03 DIAGNOSIS — R5383 Other fatigue: Secondary | ICD-10-CM

## 2020-10-03 DIAGNOSIS — M545 Low back pain, unspecified: Secondary | ICD-10-CM | POA: Diagnosis not present

## 2020-10-03 DIAGNOSIS — Z800 Family history of malignant neoplasm of digestive organs: Secondary | ICD-10-CM | POA: Insufficient documentation

## 2020-10-03 DIAGNOSIS — Z8249 Family history of ischemic heart disease and other diseases of the circulatory system: Secondary | ICD-10-CM | POA: Insufficient documentation

## 2020-10-03 DIAGNOSIS — L819 Disorder of pigmentation, unspecified: Secondary | ICD-10-CM | POA: Insufficient documentation

## 2020-10-03 DIAGNOSIS — Z818 Family history of other mental and behavioral disorders: Secondary | ICD-10-CM | POA: Diagnosis not present

## 2020-10-03 DIAGNOSIS — Z79899 Other long term (current) drug therapy: Secondary | ICD-10-CM | POA: Insufficient documentation

## 2020-10-03 DIAGNOSIS — Z8614 Personal history of Methicillin resistant Staphylococcus aureus infection: Secondary | ICD-10-CM | POA: Diagnosis not present

## 2020-10-03 DIAGNOSIS — M5136 Other intervertebral disc degeneration, lumbar region: Secondary | ICD-10-CM | POA: Insufficient documentation

## 2020-10-03 DIAGNOSIS — R972 Elevated prostate specific antigen [PSA]: Secondary | ICD-10-CM | POA: Diagnosis not present

## 2020-10-03 DIAGNOSIS — M2560 Stiffness of unspecified joint, not elsewhere classified: Secondary | ICD-10-CM | POA: Diagnosis not present

## 2020-10-03 DIAGNOSIS — M47819 Spondylosis without myelopathy or radiculopathy, site unspecified: Secondary | ICD-10-CM | POA: Diagnosis not present

## 2020-10-03 DIAGNOSIS — Z8041 Family history of malignant neoplasm of ovary: Secondary | ICD-10-CM | POA: Insufficient documentation

## 2020-10-03 DIAGNOSIS — Z833 Family history of diabetes mellitus: Secondary | ICD-10-CM | POA: Insufficient documentation

## 2020-10-03 DIAGNOSIS — R5382 Chronic fatigue, unspecified: Secondary | ICD-10-CM | POA: Insufficient documentation

## 2020-10-03 NOTE — Progress Notes (Signed)
[place] NOTE  Patient Care Team: Lorrene Reid, PA-C as PCP - General Wilford Corner, MD as Consulting Physician (Gastroenterology) Warren Danes, PA-C as Physician Assistant (Dermatology) Carolan Clines, MD (Inactive) as Consulting Physician (Urology) [name], MD as Consulting Physician (Infectious Diseases)  CHIEF COMPLAINTS/PURPOSE OF CONSULTATION:  Iron overload disorder  ASSESSMENT & PLAN:    This is a very pleasant 60 year old male patient with iron overload disorder referred to hematology for additional recommendations regarding possible hemochromatosis and treatment.  We have discussed the following findings today. HH is most commonly due to mutations in [facility] gene, is a disorder in which intestinal iron absorption can lead to to total body iron overload.HFE mutations are quite common; however, not all individuals with HFE mutations develop iron overload.  Mutations in the HFE gene are seen in the vast majority of individuals with HH.  Not all individuals with HFE mutations develop iron overload and clinical HH. Other genetic and/or environmental factors, as well as other medical conditions, dietary iron intake, and blood loss (eg, from physiologic bleeding such as menstruation or pathologic bleeding) likely play a role in the manifestation of clinically significant body iron burden.   Chronic fatigue, skin hyperpigmentation, swelling of joints, joint stiffness happened to be the most common symptoms.  Occasionally we can see an abnormal liver function test, bronze hyperpigmentation, diabetes, important to maintain electrocardiographic abnormalities which is uncommon in the current era.  Management in patients with iron overload include phlebotomy if patient cannot tolerate phlebotomy and we could consider iron chelator.  If patients do not have iron overload, they can be monitored  He is agreeable to hemochromatosis genetic testing.   We also will initiate therapeutic phlebotomy every 2 weeks, okay to proceed as long as hematocrit is over 34 and he is tolerating it well. Given his family history of ovarian cancer in mom and colon cancer in dad, also discussed about a genetics referral and he is agreeable, this has been sent. Age-appropriate cancer screening advised.  He was advised to cut down on his alcohol intake and avoid any other hepatotoxins. Thank you for consulting [place] in the care of this patient.  Please do not hesitate to contact us with any additional questions or concerns.   HISTORY OF PRESENTING ILLNESS:   Keith Bullock 60 y.o. male is here because of Iron overload disorder.  This is a very pleasant 60 year old male patient with hypertension referred to hematology for evaluation of iron overload disorder.  Mr. Defrees arrived to the appointment today by himself.  He tells me that he has been feeling very tired, has muscle aches, joint aches, sexual dysfunction and had a discussion with his physician friend who suggested evaluation for iron overload.  He had ferritin which was very high and hence further work-up was done concerning for hemochromatosis and referral was placed.  He tells me that he comes from [place] and although he does not remember anyone with this diagnosis, his clinical presentation and symptoms that he read about hemochromatosis very well match with his symptoms.  He also drinks about 5-6 drinks of alcohol every day.  He otherwise denies any chronic Tylenol use, iron supplementation.  He has started cutting down on his red meat.  He donated blood a very long time ago, not recently. Rest of the pertinent 10 point ROS reviewed and negative.  MEDICAL HISTORY:  Past Medical History:  Diagnosis Date   Hypertension    MRSA cellulitis  SURGICAL HISTORY: Past Surgical History:  Procedure Laterality Date   INSERTION OF MESH N/A 02/23/2017   Procedure: INSERTION OF MESH;  Surgeon: Ralene Ok, MD;  Location: [place];  Service: General;  Laterality: N/A;   ROTATOR CUFF REPAIR Left    UMBILICAL HERNIA REPAIR N/A 02/23/2017   Procedure: LAPAROSCOPIC UMBILICAL HERNIA REPAIR WITH MESH;  Surgeon: Ralene Ok, MD;  Location: [place];  Service: General;  Laterality: N/A;    SOCIAL HISTORY: Social History   Socioeconomic History   Marital status: Married    Spouse name: Not on file   Number of children: Not on file   Years of education: Not on file   Highest education level: Not on file  Occupational History   Not on file  Tobacco Use   Smoking status: Never   Smokeless tobacco: Never  Vaping Use   Vaping Use: Never used  Substance and Sexual Activity   Alcohol use: Yes    Comment: 2-3 a day   Drug use: No   Sexual activity: Yes  Other Topics Concern   Not on file  Social History Narrative   Not on file   Social Determinants of Health   Financial Resource Strain: Not on file  Food Insecurity: Not on file  Transportation Needs: Not on file  Physical Activity: Not on file  Stress: Not on file  Social Connections: Not on file  Intimate Partner Violence: Not on file    FAMILY HISTORY: Family History  Problem Relation Age of Onset   Cancer Mother        Ovarian   Depression Mother    Hypertension Mother    Cancer Father        Colon   Diabetes Paternal Grandfather     ALLERGIES:  is allergic to flomax [tamsulosin hcl].  MEDICATIONS:  Current Outpatient Medications  Medication Sig Dispense Refill   amLODipine (NORVASC) 10 MG tablet TAKE 1 TABLET BY MOUTH ONCE DAILY 90 tablet 0   Biotin 5000 MCG TABS Take 1 tablet by mouth daily.     cetirizine (ZYRTEC) 10 MG tablet Take 10 mg by mouth daily as needed for allergies.     Cyanocobalamin (B-12) 500 MCG TABS Take 1 tablet by mouth daily.     cyclobenzaprine (FLEXERIL) 10 MG tablet Take 1 tablet (10 mg total) by mouth 3 (three) times daily as needed for muscle spasms. 30 tablet 0   ezetimibe (ZETIA) 10  MG tablet TAKE 1 TABLET BY MOUTH AT BEDTIME. 90 tablet 0   Famotidine-Ca Carb-Mag Hydrox (PEPCID COMPLETE PO) Take 1 tablet by mouth daily as needed (acid reflux).     gabapentin (NEURONTIN) 100 MG capsule Take 2 capsules (200 mg total) by mouth at bedtime. 180 capsule 0   hydrOXYzine (ATARAX/VISTARIL) 10 MG tablet Take 1 tablet (10 mg total) by mouth 3 (three) times daily as needed. 270 tablet 0   Multiple Vitamin (MULTIVITAMIN WITH MINERALS) TABS tablet Take 1 tablet by mouth daily.     Naphazoline-Pheniramine 0.027-0.315 % SOLN Apply 1 drop to eye daily as needed (dry eyes).     naproxen sodium (ANAPROX) 220 MG tablet Take 220 mg by mouth daily as needed (pain).      omega-3 acid ethyl esters (LOVAZA) 1 g capsule Take 2 capsules (2 g total) by mouth 2 (two) times daily. 360 capsule 0   sildenafil (REVATIO) 20 MG tablet Take 3 tablets (60 mg total) by mouth daily as needed (erectile dysfunction). 30 tablet  0   traMADol (ULTRAM) 50 MG tablet Take 1 tablet (50 mg total) by mouth every 6 (six) hours as needed. 30 tablet 0   valsartan-hydrochlorothiazide (DIOVAN-HCT) 320-25 MG tablet Take 1 tablet by mouth daily. 90 tablet 0   No current facility-administered medications for this visit.    PHYSICAL EXAMINATION: ECOG PERFORMANCE STATUS: 1 - Symptomatic but completely ambulatory  Vitals:   10/03/20 1005  BP: 104/69  Pulse: 87  Resp: 18  Temp: 98.5 F (36.9 C)  SpO2: 94%   Filed Weights   10/03/20 1005  Weight: 186 lb 3 oz (84.5 kg)    GENERAL:alert, no distress and comfortable SKIN: skin color, texture, turgor are normal, no rashes or significant lesions EYES: normal, conjunctiva are pink and non-injected, sclera clear OROPHARYNX:no exudate, no erythema and lips, buccal mucosa, and tongue normal  NECK: supple, thyroid normal size, non-tender, without nodularity LYMPH:  no palpable lymphadenopathy in the cervical, axillary or inguinal LUNGS: clear to auscultation and percussion with  normal breathing effort HEART: regular rate & rhythm and no murmurs and no lower extremity edema ABDOMEN:abdomen soft, non-tender and normal bowel sounds Musculoskeletal:no cyanosis of digits and no clubbing  PSYCH: alert & oriented x 3 with fluent speech NEURO: no focal motor/sensory deficits  LABORATORY DATA:  I have reviewed the data as listed Lab Results  Component Value Date   WBC 4.3 09/26/2020   HGB 14.1 09/26/2020   HCT 40.8 09/26/2020   MCV 98.6 09/26/2020   PLT 237.0 09/26/2020     Chemistry      Component Value Date/Time   NA 127 (L) 09/26/2020 0959   NA 134 06/19/2020 1118   K 3.6 09/26/2020 0959   CL 90 (L) 09/26/2020 0959   CO2 26 09/26/2020 0959   BUN 6 09/26/2020 0959   BUN 12 06/19/2020 1118   CREATININE 0.93 09/26/2020 0959   CREATININE 1.05 01/07/2016 0953   GLU 113 09/01/2016 0000      Component Value Date/Time   CALCIUM 9.3 09/26/2020 0959   ALKPHOS 66 09/26/2020 0959   AST 110 (H) 09/26/2020 0959   ALT 65 (H) 09/26/2020 0959   BILITOT 1.2 09/26/2020 0959   BILITOT 0.7 06/19/2020 1118     I have reviewed his labs CBC unremarkable.  Testosterone is normal.  Iron panel with ferritin showed saturation ratio 51%, ferritin is 1333.  Comprehensive metabolic panel showed transaminitis with AST of 110 and ALT of 65.  INR is 1.1.  Hemochromatosis panel pending.  RADIOGRAPHIC STUDIES: I have personally reviewed the radiological images as listed and agreed with the findings in the report. DG Lumbar Spine 2-3 Views  Result Date: 09/28/2020 CLINICAL DATA:  Lumbar spine pain. Low back pain radiating into the right lateral hip for 5 months after playing golf. EXAM: LUMBAR SPINE - 2-3 VIEW COMPARISON:  None. FINDINGS: Lateral view is limited by positioning. Five lumbar type vertebra. Slight straightening of normal lordosis. No listhesis. Disc space narrowing and endplate spurring at [id number], L4-L5, and L5-S1. L4-L5 and L5-S1 facet hypertrophy. Vertebral body heights  are normal. No evidence of fracture or focal bone abnormality. The sacroiliac joints are congruent. IMPRESSION: 1. Degenerative disc disease at L3-L4, L4-L5, and L5-S1. 2. L4-L5 and L5-S1 facet hypertrophy. Electronically Signed   By: Keith Rake M.D.   On: 09/28/2020 11:32    All questions were answered. The patient knows to call the clinic with any problems, questions or concerns. I spent 45 minutes in the care of this  patient including H and P, review of records, counseling and coordination of care.     Benay Pike, MD 10/03/2020 10:13 AM

## 2020-10-11 LAB — HEMOCHROMATOSIS DNA-PCR(C282Y,H63D)

## 2020-10-14 ENCOUNTER — Inpatient Hospital Stay: Payer: BC Managed Care – PPO

## 2020-10-14 ENCOUNTER — Other Ambulatory Visit: Payer: Self-pay | Admitting: *Deleted

## 2020-10-14 ENCOUNTER — Other Ambulatory Visit: Payer: Self-pay

## 2020-10-14 ENCOUNTER — Other Ambulatory Visit: Payer: Self-pay | Admitting: Hematology and Oncology

## 2020-10-14 DIAGNOSIS — M48061 Spinal stenosis, lumbar region without neurogenic claudication: Secondary | ICD-10-CM | POA: Diagnosis not present

## 2020-10-14 DIAGNOSIS — Z800 Family history of malignant neoplasm of digestive organs: Secondary | ICD-10-CM | POA: Diagnosis not present

## 2020-10-14 DIAGNOSIS — M2560 Stiffness of unspecified joint, not elsewhere classified: Secondary | ICD-10-CM | POA: Diagnosis not present

## 2020-10-14 DIAGNOSIS — R5382 Chronic fatigue, unspecified: Secondary | ICD-10-CM | POA: Diagnosis not present

## 2020-10-14 DIAGNOSIS — Z8041 Family history of malignant neoplasm of ovary: Secondary | ICD-10-CM | POA: Diagnosis not present

## 2020-10-14 DIAGNOSIS — Z8614 Personal history of Methicillin resistant Staphylococcus aureus infection: Secondary | ICD-10-CM | POA: Diagnosis not present

## 2020-10-14 DIAGNOSIS — M545 Low back pain, unspecified: Secondary | ICD-10-CM | POA: Diagnosis not present

## 2020-10-14 DIAGNOSIS — M47819 Spondylosis without myelopathy or radiculopathy, site unspecified: Secondary | ICD-10-CM | POA: Diagnosis not present

## 2020-10-14 DIAGNOSIS — Z8249 Family history of ischemic heart disease and other diseases of the circulatory system: Secondary | ICD-10-CM | POA: Diagnosis not present

## 2020-10-14 DIAGNOSIS — Z833 Family history of diabetes mellitus: Secondary | ICD-10-CM | POA: Diagnosis not present

## 2020-10-14 DIAGNOSIS — L819 Disorder of pigmentation, unspecified: Secondary | ICD-10-CM | POA: Diagnosis not present

## 2020-10-14 DIAGNOSIS — M5136 Other intervertebral disc degeneration, lumbar region: Secondary | ICD-10-CM | POA: Diagnosis not present

## 2020-10-14 DIAGNOSIS — Z79899 Other long term (current) drug therapy: Secondary | ICD-10-CM | POA: Diagnosis not present

## 2020-10-14 DIAGNOSIS — I10 Essential (primary) hypertension: Secondary | ICD-10-CM | POA: Diagnosis not present

## 2020-10-14 DIAGNOSIS — R972 Elevated prostate specific antigen [PSA]: Secondary | ICD-10-CM | POA: Diagnosis not present

## 2020-10-14 LAB — CBC WITH DIFFERENTIAL/PLATELET
Abs Immature Granulocytes: 0.02 10*3/uL (ref 0.00–0.07)
Basophils Absolute: 0.1 10*3/uL (ref 0.0–0.1)
Basophils Relative: 1 %
Eosinophils Absolute: 0 10*3/uL (ref 0.0–0.5)
Eosinophils Relative: 1 %
HCT: 29.9 % — ABNORMAL LOW (ref 39.0–52.0)
Hemoglobin: 10.9 g/dL — ABNORMAL LOW (ref 13.0–17.0)
Immature Granulocytes: 0 %
Lymphocytes Relative: 24 %
Lymphs Abs: 1.1 10*3/uL (ref 0.7–4.0)
MCH: 34.3 pg — ABNORMAL HIGH (ref 26.0–34.0)
MCHC: 36.5 g/dL — ABNORMAL HIGH (ref 30.0–36.0)
MCV: 94 fL (ref 80.0–100.0)
Monocytes Absolute: 0.4 10*3/uL (ref 0.1–1.0)
Monocytes Relative: 8 %
Neutro Abs: 3.1 10*3/uL (ref 1.7–7.7)
Neutrophils Relative %: 66 %
Platelets: 222 10*3/uL (ref 150–400)
RBC: 3.18 MIL/uL — ABNORMAL LOW (ref 4.22–5.81)
RDW: 11.6 % (ref 11.5–15.5)
WBC: 4.6 10*3/uL (ref 4.0–10.5)
nRBC: 0 % (ref 0.0–0.2)

## 2020-10-14 NOTE — Progress Notes (Signed)
Pt not within tx parameters for phlebotomy today. Pt donated blood last week to the blood bank.

## 2020-10-15 ENCOUNTER — Ambulatory Visit: Payer: BC Managed Care – PPO | Admitting: Family Medicine

## 2020-10-17 ENCOUNTER — Inpatient Hospital Stay (HOSPITAL_BASED_OUTPATIENT_CLINIC_OR_DEPARTMENT_OTHER): Payer: BC Managed Care – PPO | Admitting: Genetic Counselor

## 2020-10-17 ENCOUNTER — Inpatient Hospital Stay: Payer: BC Managed Care – PPO

## 2020-10-17 ENCOUNTER — Other Ambulatory Visit: Payer: Self-pay | Admitting: Genetic Counselor

## 2020-10-17 ENCOUNTER — Other Ambulatory Visit: Payer: Self-pay

## 2020-10-17 DIAGNOSIS — Z8041 Family history of malignant neoplasm of ovary: Secondary | ICD-10-CM

## 2020-10-17 DIAGNOSIS — Z800 Family history of malignant neoplasm of digestive organs: Secondary | ICD-10-CM | POA: Diagnosis not present

## 2020-10-17 DIAGNOSIS — Z833 Family history of diabetes mellitus: Secondary | ICD-10-CM | POA: Diagnosis not present

## 2020-10-17 DIAGNOSIS — M5136 Other intervertebral disc degeneration, lumbar region: Secondary | ICD-10-CM | POA: Diagnosis not present

## 2020-10-17 DIAGNOSIS — M2560 Stiffness of unspecified joint, not elsewhere classified: Secondary | ICD-10-CM | POA: Diagnosis not present

## 2020-10-17 DIAGNOSIS — I10 Essential (primary) hypertension: Secondary | ICD-10-CM | POA: Diagnosis not present

## 2020-10-17 DIAGNOSIS — M545 Low back pain, unspecified: Secondary | ICD-10-CM | POA: Diagnosis not present

## 2020-10-17 DIAGNOSIS — L819 Disorder of pigmentation, unspecified: Secondary | ICD-10-CM | POA: Diagnosis not present

## 2020-10-17 DIAGNOSIS — R5382 Chronic fatigue, unspecified: Secondary | ICD-10-CM | POA: Diagnosis not present

## 2020-10-17 DIAGNOSIS — M47819 Spondylosis without myelopathy or radiculopathy, site unspecified: Secondary | ICD-10-CM | POA: Diagnosis not present

## 2020-10-17 DIAGNOSIS — M48061 Spinal stenosis, lumbar region without neurogenic claudication: Secondary | ICD-10-CM | POA: Diagnosis not present

## 2020-10-17 DIAGNOSIS — R972 Elevated prostate specific antigen [PSA]: Secondary | ICD-10-CM | POA: Diagnosis not present

## 2020-10-17 DIAGNOSIS — Z8614 Personal history of Methicillin resistant Staphylococcus aureus infection: Secondary | ICD-10-CM | POA: Diagnosis not present

## 2020-10-17 DIAGNOSIS — Z8249 Family history of ischemic heart disease and other diseases of the circulatory system: Secondary | ICD-10-CM | POA: Diagnosis not present

## 2020-10-17 DIAGNOSIS — Z79899 Other long term (current) drug therapy: Secondary | ICD-10-CM | POA: Diagnosis not present

## 2020-10-17 LAB — CBC WITH DIFFERENTIAL/PLATELET
Abs Immature Granulocytes: 0.01 10*3/uL (ref 0.00–0.07)
Basophils Absolute: 0.1 10*3/uL (ref 0.0–0.1)
Basophils Relative: 1 %
Eosinophils Absolute: 0.1 10*3/uL (ref 0.0–0.5)
Eosinophils Relative: 1 %
HCT: 34.3 % — ABNORMAL LOW (ref 39.0–52.0)
Hemoglobin: 12 g/dL — ABNORMAL LOW (ref 13.0–17.0)
Immature Granulocytes: 0 %
Lymphocytes Relative: 22 %
Lymphs Abs: 1 10*3/uL (ref 0.7–4.0)
MCH: 33.4 pg (ref 26.0–34.0)
MCHC: 35 g/dL (ref 30.0–36.0)
MCV: 95.5 fL (ref 80.0–100.0)
Monocytes Absolute: 0.4 10*3/uL (ref 0.1–1.0)
Monocytes Relative: 8 %
Neutro Abs: 3.1 10*3/uL (ref 1.7–7.7)
Neutrophils Relative %: 68 %
Platelets: 240 10*3/uL (ref 150–400)
RBC: 3.59 MIL/uL — ABNORMAL LOW (ref 4.22–5.81)
RDW: 11.7 % (ref 11.5–15.5)
WBC: 4.6 10*3/uL (ref 4.0–10.5)
nRBC: 0 % (ref 0.0–0.2)

## 2020-10-17 LAB — GENETIC SCREENING ORDER

## 2020-10-18 ENCOUNTER — Encounter: Payer: Self-pay | Admitting: Genetic Counselor

## 2020-10-18 ENCOUNTER — Encounter: Payer: Self-pay | Admitting: Hematology and Oncology

## 2020-10-18 DIAGNOSIS — Z8041 Family history of malignant neoplasm of ovary: Secondary | ICD-10-CM

## 2020-10-18 DIAGNOSIS — Z800 Family history of malignant neoplasm of digestive organs: Secondary | ICD-10-CM | POA: Insufficient documentation

## 2020-10-18 HISTORY — DX: Family history of malignant neoplasm of ovary: Z80.41

## 2020-10-18 HISTORY — DX: Family history of malignant neoplasm of digestive organs: Z80.0

## 2020-10-18 NOTE — Progress Notes (Signed)
REFERRING PROVIDER: Benay Pike, MD [place],  [place] [address]  PRIMARY PROVIDER:  Lorrene Reid, PA-C  PRIMARY REASON FOR VISIT:  1. Family history of ovarian cancer   2. Family history of colon cancer    HISTORY OF PRESENT ILLNESS:   Mr. Arnesen, a 60 y.o. male, was seen for a [facility] cancer genetics consultation at the request of Dr. Chryl Heck due to a family history of cancer.  Mr. Dooly presents to clinic today to discuss the possibility of a hereditary predisposition to cancer, to discuss genetic testing, and to further clarify his future cancer risks, as well as potential cancer risks for family members.   Mr. Dicioccio is a 60 y.o. male with no personal history of cancer.    CANCER HISTORY:  Oncology History   No history exists.     RISK FACTORS:  PSA screening: yes; history of elevated PSA; history of being followed by [facility] Colonoscopy: most recent in 2017; every 5 years due to family history  Dermatology as needed.   Past Medical History:  Diagnosis Date   Family history of colon cancer 10/18/2020   Family history of ovarian cancer 10/18/2020   Hypertension    MRSA cellulitis     Past Surgical History:  Procedure Laterality Date   INSERTION OF MESH N/A 02/23/2017   Procedure: INSERTION OF MESH;  Surgeon: Ralene Ok, MD;  Location: [facility];  Service: General;  Laterality: N/A;   ROTATOR CUFF REPAIR Left    UMBILICAL HERNIA REPAIR N/A 02/23/2017   Procedure: LAPAROSCOPIC UMBILICAL HERNIA REPAIR WITH MESH;  Surgeon: Ralene Ok, MD;  Location: [facility];  Service: General;  Laterality: N/A;    Social History   Socioeconomic History   Marital status: Married    Spouse name: Not on file   Number of children: Not on file   Years of education: Not on file   Highest education level: Not on file  Occupational History   Not on file  Tobacco Use   Smoking status: Never   Smokeless tobacco: Never  Vaping Use   Vaping Use: Never used   Substance and Sexual Activity   Alcohol use: Yes    Comment: 2-3 a day   Drug use: No   Sexual activity: Yes  Other Topics Concern   Not on file  Social History Narrative   Not on file   Social Determinants of Health   Financial Resource Strain: Not on file  Food Insecurity: Not on file  Transportation Needs: Not on file  Physical Activity: Not on file  Stress: Not on file  Social Connections: Not on file     FAMILY HISTORY:  We obtained a detailed, 4-generation family history.  Significant diagnoses are listed below: Family History  Problem Relation Age of Onset   Ovarian cancer Mother 6   Colon cancer Father 61   Stomach cancer Paternal Aunt        d. 21     Mr. Furches is unaware of previous family history of genetic testing for hereditary cancer risks. Patient's ancestors are of German/Irish descent. There is no reported Ashkenazi Jewish ancestry. There is no known consanguinity.  GENETIC COUNSELING ASSESSMENT: Mr. Casher is a 60 y.o. male with a family history of cancer which is somewhat suggestive of a hereditary cancer syndrome and predisposition to cancer given his mother's diagnosis of ovarian cancer and limited information about his mother's family history. We, therefore, discussed and recommended the following at today's  visit.   DISCUSSION: We discussed that 5 - 10% of cancer is hereditary, with most cases of hereditary ovarian cancer associated with mutations in BRCA1/2.  There are other genes that can be associated with hereditary ovarian or colon cancer syndromes.  Type of cancer risk and level of risk are gene-specific.  We discussed that testing is beneficial for several reasons, including knowing about other cancer risks, identifying potential screening and risk-reduction options that may be appropriate, and to understanding if other family members could be at risk for cancer and allowing them to undergo genetic testing.  We reviewed the characteristics,  features and inheritance patterns of hereditary cancer syndromes. We also discussed genetic testing, including the appropriate family members to test, the process of testing, insurance coverage and turn-around-time for results. We discussed the implications of a negative, positive, carrier and/or variant of uncertain significant result. We discussed that negative results would be uninformative given that Mr. Bollard does not have a personal history of cancer. We recommended Mr. Holsopple pursue genetic testing for a panel that contains genes associated with ovarian and colon cancers.  Mr. Garris was offered a common hereditary cancer panel (47 genes) and an expanded pan-cancer panel (77 genes). Mr. David was informed of the benefits and limitations of each panel, including that expanded pan-cancer panels contain several genes that do not have clear management guidelines at this point in time.  We also discussed that as the number of genes included on a panel increases, the chances of variants of uncertain significance increases.  After considering the benefits and limitations of each gene panel, Mr. Beaumier elected to have an expanded pan-cancer panel through [facility].   The CancerNext-Expanded gene panel offered by [facility] and includes sequencing, rearrangement, and RNA analysis for the following 77 genes: AIP, ALK, APC, ATM, AXIN2, BAP1, BARD1, BLM, BMPR1A, BRCA1, BRCA2, BRIP1, CDC73, CDH1, CDK4, CDKN1B, CDKN2A, CHEK2, CTNNA1, DICER1, FANCC, FH, FLCN, GALNT12, KIF1B, LZTR1, MAX, MEN1, MET, MLH1, MSH2, MSH3, MSH6, MUTYH, NBN, NF1, NF2, NTHL1, PALB2, PHOX2B, PMS2, POT1, PRKAR1A, PTCH1, PTEN, RAD51C, RAD51D, RB1, RECQL, RET, SDHA, SDHAF2, SDHB, SDHC, SDHD, SMAD4, SMARCA4, SMARCB1, SMARCE1, STK11, SUFU, TMEM127, TP53, TSC1, TSC2, VHL and XRCC2 (sequencing and deletion/duplication); EGFR, EGLN1, HOXB13, KIT, MITF, PDGFRA, POLD1, and POLE (sequencing only); EPCAM and GREM1 (deletion/duplication only).   Based on  Mr. Cooprider family history of cancer, he meets medical criteria for genetic testing. Despite that he meets criteria, he may still have an out of pocket cost. We discussed that if his out of pocket cost for testing is over $100, the laboratory should contact him to discuss self-pay options and/or patient pay assistance programs.   We discussed that some people do not want to undergo genetic testing due to fear of genetic discrimination.  A federal law called the Genetic Information Non-Discrimination Act (GINA) of 2008 helps protect individuals against genetic discrimination based on their genetic test results.  It impacts both health insurance and employment.  With health insurance, it protects against increased premiums, being kicked off insurance or being forced to take a test in order to be insured.  For employment it protects against hiring, firing and promoting decisions based on genetic test results.  GINA does not apply to those in the [employer], those who work for companies with less than 15 employees, and new life insurance or long-term disability insurance policies.  Health status due to a cancer diagnosis is not protected under GINA.  PLAN: After considering the risks, benefits, and limitations, Mr.  Badertscher provided informed consent to pursue genetic testing and the blood sample was sent to [facility] for analysis of the CancerNext-Expanded +RNAinsight Panel. Results should be available within approximately 3 weeks' time, at which point they will be disclosed by telephone to Mr. Hulsebus, as will any additional recommendations warranted by these results. Mr. Feagans will receive a summary of his genetic counseling visit and a copy of his results once available. This information will also be available in Epic.   Based on Mr. Petron family history of ovarian cancer in his deceased mother, we recommended his sister have genetic counseling and testing. Mr. Cohron will let us know if we can be of  any assistance in coordinating genetic counseling and/or testing for this family member.   Lastly, we encouraged Mr. Cropper to remain in contact with cancer genetics annually so that we can continuously update the family history and inform him of any changes in cancer genetics and testing that may be of benefit for this family.   Mr. Cumpian questions were answered to his satisfaction today. Our contact information was provided should additional questions or concerns arise. Thank you for the referral and allowing [place] to share in the care of your patient.   Renley Gutman M. Joette Catching, [place], [facility] Genetic Counselor [email or web address] (P) [phone]   The patient was seen for a total of 30 minutes in face-to-face genetic counseling.  The patient was accompanied by his wife. Drs. Magrinat, Lindi Adie and/or Burr Medico were available to discuss this case as needed.  _______________________________________________________________________ For Office Staff:  Number of people involved in session: 2 Was an Intern/ student involved with case: no

## 2020-10-24 NOTE — Progress Notes (Signed)
[place] [place] Phone: [phone] Subjective:   Keith Bullock, am serving as a scribe for Dr. Hulan Saas. This visit occurred during the SARS-CoV-2 public health emergency.  Safety protocols were in place, including screening questions prior to the visit, additional usage of staff PPE, and extensive cleaning of exam room while observing appropriate contact time as indicated for disinfecting solutions.   I'm seeing this patient by the request  of:  Lorrene Reid, PA-C  CC: Left shoulder pain follow-up  LFY:[id number]  09/26/2020 Concerned that some of patient's hip pain seems to be secondary to more of back radicular symptoms as well.  We will get x-rays to further evaluate.  X-rays of the hip previously were independently visualized by me showing Bullock significant bony abnormality at the moment.  Started on low-dose of gabapentin to help him with some sleep as well.  Follow-up with me again 4 weeks  I believe the patient does have some anxiety as well as likely some underlying depression.  Will refer patient to behavioral health.  Given some hydroxyzine to see how patient would respond.  Patient has been self-medicating somewhat with alcohol and warned of potential side effects with the gabapentin on board as well.  Patient has had elevated transaminitis and we will recheck labs today to make sure patient is doing relatively well and see how patient's liver is functioning.  Follow-up with me again in 4 weeks otherwise.  Update 10/25/2020 Keith Bullock is a 60 y.o. male coming in with complaint of back and R hip pain. Patient states that his hip is doing much better. Patient notes giving blood for hemacromatosis and felt better the next day.   Patient feels like he may have torn something in L shoulder from fall in April. Pain increases with weight in hand. Notices popping and clicking. Shoulder surgery in 2014. Denies any  radiating symptoms and pain is present over posterior aspect.   Xray lumbar 09/26/2020 IMPRESSION: 1. Degenerative disc disease at L3-L4, L4-L5, and L5-S1. 2. L4-L5 and L5-S1 facet hypertrophy     Past Medical History:  Diagnosis Date   Family history of colon cancer 10/18/2020   Family history of ovarian cancer 10/18/2020   Hypertension    MRSA cellulitis    Past Surgical History:  Procedure Laterality Date   INSERTION OF MESH N/A 02/23/2017   Procedure: INSERTION OF MESH;  Surgeon: Ralene Ok, MD;  Location: [facility] OR;  Service: General;  Laterality: N/A;   ROTATOR CUFF REPAIR Left    UMBILICAL HERNIA REPAIR N/A 02/23/2017   Procedure: LAPAROSCOPIC UMBILICAL HERNIA REPAIR WITH MESH;  Surgeon: Ralene Ok, MD;  Location: [facility] OR;  Service: General;  Laterality: N/A;   Social History   Socioeconomic History   Marital status: Married    Spouse name: Not on file   Number of children: Not on file   Years of education: Not on file   Highest education level: Not on file  Occupational History   Not on file  Tobacco Use   Smoking status: Never   Smokeless tobacco: Never  Vaping Use   Vaping Use: Never used  Substance and Sexual Activity   Alcohol use: Yes    Comment: 2-3 a day   Drug use: Bullock   Sexual activity: Yes  Other Topics Concern   Not on file  Social History Narrative   Not on file   Social Determinants of Health   Financial  Resource Strain: Not on file  Food Insecurity: Not on file  Transportation Needs: Not on file  Physical Activity: Not on file  Stress: Not on file  Social Connections: Not on file   Allergies  Allergen Reactions   Flomax [Tamsulosin Hcl] Anxiety   Family History  Problem Relation Age of Onset   Ovarian cancer Mother 76   Depression Mother    Hypertension Mother    Colon cancer Father 43   Stomach cancer Paternal Aunt        d. 31   Diabetes Paternal Grandfather      Current Outpatient Medications (Cardiovascular):     amLODipine (NORVASC) 10 MG tablet, TAKE 1 TABLET BY MOUTH ONCE DAILY   ezetimibe (ZETIA) 10 MG tablet, TAKE 1 TABLET BY MOUTH AT BEDTIME.   omega-3 acid ethyl esters (LOVAZA) 1 g capsule, Take 2 capsules (2 g total) by mouth 2 (two) times daily.   sildenafil (REVATIO) 20 MG tablet, Take 3 tablets (60 mg total) by mouth daily as needed (erectile dysfunction).   valsartan-hydrochlorothiazide (DIOVAN-HCT) 320-25 MG tablet, Take 1 tablet by mouth daily.  Current Outpatient Medications (Respiratory):    cetirizine (ZYRTEC) 10 MG tablet, Take 10 mg by mouth daily as needed for allergies.  Current Outpatient Medications (Analgesics):    naproxen sodium (ANAPROX) 220 MG tablet, Take 220 mg by mouth daily as needed (pain).    traMADol (ULTRAM) 50 MG tablet, Take 1 tablet (50 mg total) by mouth every 6 (six) hours as needed.  Current Outpatient Medications (Hematological):    Cyanocobalamin (B-12) 500 MCG TABS, Take 1 tablet by mouth daily.  Current Outpatient Medications (Other):    Biotin 5000 MCG TABS, Take 1 tablet by mouth daily.   cyclobenzaprine (FLEXERIL) 10 MG tablet, Take 1 tablet (10 mg total) by mouth 3 (three) times daily as needed for muscle spasms.   Famotidine-Ca Carb-Mag Hydrox (PEPCID COMPLETE PO), Take 1 tablet by mouth daily as needed (acid reflux).   gabapentin (NEURONTIN) 100 MG capsule, Take 2 capsules (200 mg total) by mouth at bedtime.   hydrOXYzine (ATARAX/VISTARIL) 10 MG tablet, Take 1 tablet (10 mg total) by mouth 3 (three) times daily as needed.   Multiple Vitamin (MULTIVITAMIN WITH MINERALS) TABS tablet, Take 1 tablet by mouth daily.   Naphazoline-Pheniramine 0.027-0.315 % SOLN, Apply 1 drop to eye daily as needed (dry eyes).   Reviewed prior external information including notes and imaging from  primary care provider As well as notes that were available from care everywhere and other healthcare systems.  Past medical history, social, surgical and family history all  reviewed in electronic medical record.  Bullock pertanent information unless stated regarding to the chief complaint.   Review of Systems:  Bullock headache, visual changes, nausea, vomiting, diarrhea, constipation, dizziness, abdominal pain, skin rash, fevers, chills, night sweats, weight loss, swollen lymph nodes, body aches, joint swelling, chest pain, shortness of breath, mood changes. POSITIVE muscle aches  Objective  Blood pressure 128/82, pulse 72, height 5\' 10"  (1.778 m), SpO2 98 %.   General: Bullock apparent distress alert and oriented x3 mood and affect normal, dressed appropriately.  HEENT: Pupils equal, extraocular movements intact  Respiratory: Patient's speak in full sentences and does not appear short of breath  Cardiovascular: Bullock lower extremity edema, non tender, Bullock erythema  Gait normal with good balance and coordination.  MSK: Left shoulder exam shows the patient does have tenderness to palpation diffusely.  With positive impingement.  Rotator cuff strength 4  out of 5.  Limited muscular skeletal ultrasound was performed and interpreted by Hulan Saas, M  Limited ultrasound of patient's left shoulder shows the patient does have degenerative tearing noted of the rotator cuff especially the supraspinatus and subscapularis.  Mild atrophy noted but Bullock significant displacement.  Patient does have postsurgical changes noted and does have arthritic changes of the acromioclavicular and glenohumeral joint. Impression likely mild to moderate rotator cuff arthropathy  Procedure: Real-time Ultrasound Guided Injection of left glenohumeral joint Device: GE Logiq E  Ultrasound guided injection is preferred based studies that show increased duration, increased effect, greater accuracy, decreased procedural pain, increased response rate with ultrasound guided versus blind injection.  Verbal informed consent obtained.  Time-out conducted.  Noted Bullock overlying erythema, induration, or other signs of local  infection.  Skin prepped in a sterile fashion.  Local anesthesia: Topical Ethyl chloride.  With sterile technique and under real time ultrasound guidance:  Joint visualized.  21g 2 inch needle inserted posterior approach. Pictures taken for needle placement. Patient did have injection of 2 cc of 0.5% Marcaine, and 1cc of Kenalog 40 mg/dL. Completed without difficulty  Pain immediately resolved suggesting accurate placement of the medication.  Advised to call if fevers/chills, erythema, induration, drainage, or persistent bleeding.  Impression: Technically successful ultrasound guided injection.   Impression and Recommendations:     The above documentation has been reviewed and is accurate and complete Lyndal Pulley, DO

## 2020-10-25 ENCOUNTER — Ambulatory Visit: Payer: Self-pay

## 2020-10-25 ENCOUNTER — Ambulatory Visit (INDEPENDENT_AMBULATORY_CARE_PROVIDER_SITE_OTHER): Payer: BC Managed Care – PPO | Admitting: Family Medicine

## 2020-10-25 ENCOUNTER — Other Ambulatory Visit: Payer: Self-pay

## 2020-10-25 ENCOUNTER — Ambulatory Visit (INDEPENDENT_AMBULATORY_CARE_PROVIDER_SITE_OTHER)
Admission: RE | Admit: 2020-10-25 | Discharge: 2020-10-25 | Disposition: A | Discharge status: Home / Self Care | Payer: BC Managed Care – PPO | Source: Ambulatory Visit | Attending: Family Medicine | Admitting: Family Medicine

## 2020-10-25 ENCOUNTER — Encounter: Payer: Self-pay | Admitting: Family Medicine

## 2020-10-25 VITALS — BP 128/82 | HR 72 | Ht 70.0 in

## 2020-10-25 DIAGNOSIS — M75102 Unspecified rotator cuff tear or rupture of left shoulder, not specified as traumatic: Secondary | ICD-10-CM | POA: Diagnosis not present

## 2020-10-25 DIAGNOSIS — M19012 Primary osteoarthritis, left shoulder: Secondary | ICD-10-CM | POA: Diagnosis not present

## 2020-10-25 DIAGNOSIS — M25512 Pain in left shoulder: Secondary | ICD-10-CM | POA: Diagnosis not present

## 2020-10-25 DIAGNOSIS — M12812 Other specific arthropathies, not elsewhere classified, left shoulder: Secondary | ICD-10-CM | POA: Diagnosis not present

## 2020-10-25 NOTE — Assessment & Plan Note (Signed)
Patient does have mild to moderate arthritic changes noted of the glenohumeral joint on the left side with some degenerative tearing of the rotator cuff.  Patient does still have the postsurgical changes noted but does have severe degenerative changes noted.  Patient elected to try an injection today.  Tolerated the procedure well.  Discussed icing regimen and home exercises.  Work with [employer].  Follow-up again in 4 to 8 weeks

## 2020-10-25 NOTE — Assessment & Plan Note (Signed)
Patient is doing well donations at the moment and is feeling much better.

## 2020-10-25 NOTE — Patient Instructions (Addendum)
Xray [address] in basement Injected L shoulder today See me again in 6 weeks

## 2020-10-30 ENCOUNTER — Encounter: Payer: BC Managed Care – PPO | Admitting: Dietician

## 2020-10-30 ENCOUNTER — Inpatient Hospital Stay: Payer: BC Managed Care – PPO | Attending: Hematology and Oncology

## 2020-10-30 ENCOUNTER — Inpatient Hospital Stay: Payer: BC Managed Care – PPO

## 2020-10-30 ENCOUNTER — Other Ambulatory Visit: Payer: Self-pay

## 2020-10-30 LAB — CBC WITH DIFFERENTIAL/PLATELET
Abs Immature Granulocytes: 0.01 10*3/uL (ref 0.00–0.07)
Basophils Absolute: 0.1 10*3/uL (ref 0.0–0.1)
Basophils Relative: 1 %
Eosinophils Absolute: 0 10*3/uL (ref 0.0–0.5)
Eosinophils Relative: 1 %
HCT: 37.7 % — ABNORMAL LOW (ref 39.0–52.0)
Hemoglobin: 13.5 g/dL (ref 13.0–17.0)
Immature Granulocytes: 0 %
Lymphocytes Relative: 25 %
Lymphs Abs: 1.4 10*3/uL (ref 0.7–4.0)
MCH: 33.8 pg (ref 26.0–34.0)
MCHC: 35.8 g/dL (ref 30.0–36.0)
MCV: 94.3 fL (ref 80.0–100.0)
Monocytes Absolute: 0.6 10*3/uL (ref 0.1–1.0)
Monocytes Relative: 11 %
Neutro Abs: 3.5 10*3/uL (ref 1.7–7.7)
Neutrophils Relative %: 62 %
Platelets: 242 10*3/uL (ref 150–400)
RBC: 4 MIL/uL — ABNORMAL LOW (ref 4.22–5.81)
RDW: 11.6 % (ref 11.5–15.5)
WBC: 5.6 10*3/uL (ref 4.0–10.5)
nRBC: 0 % (ref 0.0–0.2)

## 2020-10-30 NOTE — Progress Notes (Signed)
Keith Bullock presents today for phlebotomy per MD orders. Phlebotomy procedure started at 10:42 and ended at 10:46. A 16G Phlebotomy kit was used to the R AC.  552 grams removed. Pt declined food and beverages.  Patient observed for 30 minutes after procedure without any incident. Patient tolerated procedure well. IV needle removed intact. Pt declined AVS. RN educated pt on aftercare and pt verbalized understanding of information provided.

## 2020-10-30 NOTE — Patient Instructions (Signed)

## 2020-11-04 ENCOUNTER — Ambulatory Visit: Payer: Self-pay | Admitting: Genetic Counselor

## 2020-11-04 ENCOUNTER — Telehealth: Payer: Self-pay | Admitting: Genetic Counselor

## 2020-11-04 ENCOUNTER — Encounter: Payer: Self-pay | Admitting: Genetic Counselor

## 2020-11-04 DIAGNOSIS — Z1379 Encounter for other screening for genetic and chromosomal anomalies: Secondary | ICD-10-CM

## 2020-11-04 DIAGNOSIS — Z800 Family history of malignant neoplasm of digestive organs: Secondary | ICD-10-CM

## 2020-11-04 DIAGNOSIS — Z8041 Family history of malignant neoplasm of ovary: Secondary | ICD-10-CM

## 2020-11-04 NOTE — Telephone Encounter (Signed)
Revealed negative genetic testing.  Discussed that we do not know why there is cancer in the family. It could be sporadic/familial, due to a change in a gene he did not inherit, due to a different gene that we are not testing, or maybe our current technology may not be able to pick something up.  It will be important for him to keep in contact with genetics to keep up with whether additional testing may be needed.  Recommended genetic testing for sister.  Discussed continuing colonoscopies at least every 5 years based on family history of colon cancer.

## 2020-11-04 NOTE — Progress Notes (Signed)
HPI:   Mr. Scerbo was previously seen in the [place] clinic due to a family history of cancer and concerns regarding a hereditary predisposition to cancer. Please refer to our prior cancer genetics clinic note for more information regarding our discussion, assessment and recommendations, at the time. Mr. Starliper recent genetic test results were disclosed to him, as were recommendations warranted by these results. These results and recommendations are discussed in more detail below.  CANCER HISTORY:  Oncology History   No history exists.    FAMILY HISTORY:  We obtained a detailed, 4-generation family history.  Significant diagnoses are listed below: Family History  Problem Relation Age of Onset   Ovarian cancer Mother 30   Colon cancer Father 58   Stomach cancer Paternal Aunt        d. 18      Mr. Denn is unaware of previous family history of genetic testing for hereditary cancer risks. Patient's ancestors are of German/Irish descent. There is no reported Ashkenazi Jewish ancestry. There is no known consanguinity.    GENETIC TEST RESULTS:  The [facility] CancerNext-Expanded +RNAinsight Panel found no pathogenic mutations. The CancerNext-Expanded gene panel offered by [facility] and includes sequencing, rearrangement, and RNA analysis for the following 77 genes: AIP, ALK, APC, ATM, AXIN2, BAP1, BARD1, BLM, BMPR1A, BRCA1, BRCA2, BRIP1, CDC73, CDH1, CDK4, CDKN1B, CDKN2A, CHEK2, CTNNA1, DICER1, FANCC, FH, FLCN, GALNT12, KIF1B, LZTR1, MAX, MEN1, MET, MLH1, MSH2, MSH3, MSH6, MUTYH, NBN, NF1, NF2, NTHL1, PALB2, PHOX2B, PMS2, POT1, PRKAR1A, PTCH1, PTEN, RAD51C, RAD51D, RB1, RECQL, RET, SDHA, SDHAF2, SDHB, SDHC, SDHD, SMAD4, SMARCA4, SMARCB1, SMARCE1, STK11, SUFU, TMEM127, TP53, TSC1, TSC2, VHL and XRCC2 (sequencing and deletion/duplication); EGFR, EGLN1, HOXB13, KIT, MITF, PDGFRA, POLD1, and POLE (sequencing only); EPCAM and GREM1 (deletion/duplication only).  .   The test  report has been scanned into EPIC and is located under the Molecular Pathology section of the Results Review tab.  A portion of the result report is included below for reference. Genetic testing reported out on October 29, 2020.      Even though a pathogenic variant was not identified, possible explanations for the cancer in the family may include: There may be no hereditary risk for cancer in the family. The cancers in Mr. Renaldo and/or their family may be due to other genetic or environmental factors. There may be a gene mutation in one of these genes that current testing methods cannot detect, but that chance is small. There could be another gene that has not yet been discovered, or that we have not yet tested, that is responsible for the cancer diagnoses in the family.  It is also possible there is a hereditary cause for the cancer in the family that Mr. Gaughan did not inherit.  Therefore, it is important to remain in touch with cancer genetics in the future so that we can continue to offer Mr. Burcher the most up to date genetic testing.    ADDITIONAL GENETIC TESTING:  We discussed with Mr. Rochin that his genetic testing was fairly extensive.  If there are genes identified to increase cancer risk that can be analyzed in the future, we would be happy to discuss and coordinate this testing at that time.    CANCER SCREENING RECOMMENDATIONS:  Mr. Ramnauth test result is considered negative (normal).  This means that we have not identified a hereditary cause for his family history of cancer at this time.   An individual's cancer risk and medical management are not determined  by genetic test results alone. Overall cancer risk assessment incorporates additional factors, including personal medical history, family history, and any available genetic information that may result in a personalized plan for cancer prevention and surveillance. Therefore, it is recommended he continue to follow the cancer  management and screening guidelines provided by his primary healthcare provider.  Based on the reported personal and family history, specific cancer screenings for Mr. KRAYTON WORTLEY:  Colonoscopies at least every 5 years, or as recommended by his gastroenterologist, based on the family history of colon cancer in a first degree relative   RECOMMENDATIONS FOR FAMILY MEMBERS:   Since he did not inherit a mutation in a cancer predisposition gene included on this panel, his children could not have inherited a known mutation from him in one of these genes. Individuals in this family might be at some increased risk of developing cancer, over the general population risk, due to the family history of cancer.  We recommend women in this family have a yearly mammogram beginning at age 24, or 4 years younger than the earliest onset of cancer, an annual clinical breast exam, and perform monthly breast self-exams. First degree relatives of those with colon cancer should receive colonoscopies beginning at age 38, or 10 years prior to the earliest diagnosis of colon cancer in the family, and receive colonoscopies at least every 5 years, or as recommended by their gastroenterologist.   Other members of the family may still carry a pathogenic variant in one of these genes that Mr. Gappa did not inherit. Based on the family history of ovarian cancer in his mother, we recommend his sister have genetic counseling and testing. Mr. Gurka will let us know if we can be of any assistance in coordinating genetic counseling and/or testing for this family member.    FOLLOW-UP:  Lastly, we discussed with Mr. Newstrom that cancer genetics is a rapidly advancing field and it is possible that new genetic tests will be appropriate for him and/or his family members in the future. We encouraged him to remain in contact with cancer genetics on an annual basis so we can update his personal and family histories and let him know of advances  in cancer genetics that may benefit this family.   Our contact number was provided. Mr. Bartnick questions were answered to his satisfaction, and he knows he is welcome to call us at anytime with additional questions or concerns.   Mia Winthrop M. Joette Catching, [place], [facility] Genetic Counselor [email or web address] (P) [phone]

## 2020-11-12 ENCOUNTER — Inpatient Hospital Stay: Payer: BC Managed Care – PPO

## 2020-11-12 ENCOUNTER — Other Ambulatory Visit: Payer: Self-pay

## 2020-11-12 LAB — CBC WITH DIFFERENTIAL/PLATELET
Abs Immature Granulocytes: 0.01 10*3/uL (ref 0.00–0.07)
Basophils Absolute: 0.1 10*3/uL (ref 0.0–0.1)
Basophils Relative: 2 %
Eosinophils Absolute: 0 10*3/uL (ref 0.0–0.5)
Eosinophils Relative: 1 %
HCT: 34.9 % — ABNORMAL LOW (ref 39.0–52.0)
Hemoglobin: 12.3 g/dL — ABNORMAL LOW (ref 13.0–17.0)
Immature Granulocytes: 0 %
Lymphocytes Relative: 25 %
Lymphs Abs: 1.2 10*3/uL (ref 0.7–4.0)
MCH: 33.9 pg (ref 26.0–34.0)
MCHC: 35.2 g/dL (ref 30.0–36.0)
MCV: 96.1 fL (ref 80.0–100.0)
Monocytes Absolute: 0.6 10*3/uL (ref 0.1–1.0)
Monocytes Relative: 13 %
Neutro Abs: 2.9 10*3/uL (ref 1.7–7.7)
Neutrophils Relative %: 59 %
Platelets: 206 10*3/uL (ref 150–400)
RBC: 3.63 MIL/uL — ABNORMAL LOW (ref 4.22–5.81)
RDW: 11.2 % — ABNORMAL LOW (ref 11.5–15.5)
WBC: 4.8 10*3/uL (ref 4.0–10.5)
nRBC: 0 % (ref 0.0–0.2)

## 2020-11-12 NOTE — Progress Notes (Signed)
Keith Bullock presents today for phlebotomy per MD orders.  HCT 34.9.  Therapeutic phlebotomy if HCT over 34. Phlebotomy procedure started at 1129 and ended at 1132. 500 grams removed. Patient observed for 30 minutes after procedure without any incident. Patient tolerated procedure well. IV needle removed intact.   1232 Pt left the unit ambulatory.

## 2020-11-12 NOTE — Patient Instructions (Signed)

## 2020-12-03 NOTE — Progress Notes (Signed)
Keith Bullock [address] [place] Phone: [phone] Subjective:   IVilma Bullock, am serving as a scribe for Dr. Hulan Saas. This visit occurred during the SARS-CoV-2 public health emergency.  Safety protocols were in place, including screening questions prior to the visit, additional usage of staff PPE, and extensive cleaning of exam room while observing appropriate contact time as indicated for disinfecting solutions.   I'm seeing this patient by the request  of:  Keith Reid, PA-C  CC:   [id number]  10/25/2020 Patient does have mild to moderate arthritic changes noted of the glenohumeral joint on the left side with some degenerative tearing of the rotator cuff.  Patient does still have the postsurgical changes noted but does have severe degenerative changes noted.  Patient elected to try an injection today.  Tolerated the procedure well.  Discussed icing regimen and home exercises.  Work with [employer].  Follow-up again in 4 to 8 weeks  Update 12/04/2020 Keith Bullock is a 60 y.o. male coming in with complaint of L shoulder pain. Patient states shoulder pops and clicks, but doesn't cause pain. Pain isn't as bad in hip or shoulder. Giving blood helps bring the pain down. Feels like its getting progressively better. Still has a bit of weakness.  Xray 10/25/2020 L shoulder IMPRESSION: Mild degenerative arthritis of the glenohumeral and acromioclavicular joints. No acute osseous abnormality.       Past Medical History:  Diagnosis Date   Family history of colon cancer 10/18/2020   Family history of ovarian cancer 10/18/2020   Hypertension    MRSA cellulitis    Past Surgical History:  Procedure Laterality Date   INSERTION OF MESH N/A 02/23/2017   Procedure: INSERTION OF MESH;  Surgeon: Ralene Ok, MD;  Location: [place];  Service: General;  Laterality: N/A;   ROTATOR CUFF REPAIR Left    UMBILICAL HERNIA REPAIR N/A  02/23/2017   Procedure: LAPAROSCOPIC UMBILICAL HERNIA REPAIR WITH MESH;  Surgeon: Ralene Ok, MD;  Location: [place];  Service: General;  Laterality: N/A;   Social History   Socioeconomic History   Marital status: Married    Spouse name: Not on file   Number of children: Not on file   Years of education: Not on file   Highest education level: Not on file  Occupational History   Not on file  Tobacco Use   Smoking status: Never   Smokeless tobacco: Never  Vaping Use   Vaping Use: Never used  Substance and Sexual Activity   Alcohol use: Yes    Comment: 2-3 a day   Drug use: No   Sexual activity: Yes  Other Topics Concern   Not on file  Social History Narrative   Not on file   Social Determinants of Health   Financial Resource Strain: Not on file  Food Insecurity: Not on file  Transportation Needs: Not on file  Physical Activity: Not on file  Stress: Not on file  Social Connections: Not on file   Allergies  Allergen Reactions   Flomax [Tamsulosin Hcl] Anxiety   Family History  Problem Relation Age of Onset   Ovarian cancer Mother 96   Depression Mother    Hypertension Mother    Colon cancer Father 84   Stomach cancer Paternal Aunt        d. 57   Diabetes Paternal Grandfather      Current Outpatient Medications (Cardiovascular):    amLODipine (NORVASC) 10 MG  tablet, TAKE 1 TABLET BY MOUTH ONCE DAILY   ezetimibe (ZETIA) 10 MG tablet, TAKE 1 TABLET BY MOUTH AT BEDTIME.   omega-3 acid ethyl esters (LOVAZA) 1 g capsule, Take 2 capsules (2 g total) by mouth 2 (two) times daily.   sildenafil (REVATIO) 20 MG tablet, Take 3 tablets (60 mg total) by mouth daily as needed (erectile dysfunction).   valsartan-hydrochlorothiazide (DIOVAN-HCT) 320-25 MG tablet, Take 1 tablet by mouth daily.  Current Outpatient Medications (Respiratory):    cetirizine (ZYRTEC) 10 MG tablet, Take 10 mg by mouth daily as needed for allergies.  Current Outpatient Medications (Analgesics):     naproxen sodium (ANAPROX) 220 MG tablet, Take 220 mg by mouth daily as needed (pain).    traMADol (ULTRAM) 50 MG tablet, Take 1 tablet (50 mg total) by mouth every 6 (six) hours as needed.  Current Outpatient Medications (Hematological):    Cyanocobalamin (B-12) 500 MCG TABS, Take 1 tablet by mouth daily.  Current Outpatient Medications (Other):    Biotin 5000 MCG TABS, Take 1 tablet by mouth daily.   cyclobenzaprine (FLEXERIL) 10 MG tablet, Take 1 tablet (10 mg total) by mouth 3 (three) times daily as needed for muscle spasms.   Famotidine-Ca Carb-Mag Hydrox (PEPCID COMPLETE PO), Take 1 tablet by mouth daily as needed (acid reflux).   gabapentin (NEURONTIN) 100 MG capsule, Take 2 capsules (200 mg total) by mouth at bedtime.   hydrOXYzine (ATARAX/VISTARIL) 10 MG tablet, Take 1 tablet (10 mg total) by mouth 3 (three) times daily as needed.   Multiple Vitamin (MULTIVITAMIN WITH MINERALS) TABS tablet, Take 1 tablet by mouth daily.   Naphazoline-Pheniramine 0.027-0.315 % SOLN, Apply 1 drop to eye daily as needed (dry eyes).   Reviewed prior external information including notes and imaging from  primary care provider As well as notes that were available from care everywhere and other healthcare systems.  Past medical history, social, surgical and family history all reviewed in electronic medical record.  No pertanent information unless stated regarding to the chief complaint.   Review of Systems:  No headache, visual changes, nausea, vomiting, diarrhea, constipation, dizziness, abdominal pain, skin rash, fevers, chills, night sweats, weight loss, swollen lymph nodes, body aches, joint swelling, chest pain, shortness of breath, mood changes. POSITIVE muscle aches  Objective  Blood pressure 116/72, pulse (!) 102, height 5\' 10"  (1.778 m), weight 187 lb (84.8 kg), SpO2 99 %.   General: No apparent distress alert and oriented x3 mood and affect normal, dressed appropriately.  HEENT: Pupils equal,  extraocular movements intact  Respiratory: Patient's speak in full sentences and does not appear short of breath  Cardiovascular: No lower extremity edema, non tender, no erythema  Gait normal with good balance and coordination.  MSK: Patient still does have aches and pains noted.  Atrophy noted of the left shoulder still noted with some crepitus noted.  Some limited range of motion.  Patient also does have some loss of lordosis of the lumbar spine.  Mild atrophy noted of the thighs bilaterally as well.    Impression and Recommendations:     The above documentation has been reviewed and is accurate and complete Lyndal Pulley, DO

## 2020-12-04 ENCOUNTER — Other Ambulatory Visit: Payer: Self-pay

## 2020-12-04 ENCOUNTER — Ambulatory Visit (INDEPENDENT_AMBULATORY_CARE_PROVIDER_SITE_OTHER): Payer: BC Managed Care – PPO | Admitting: Family Medicine

## 2020-12-04 VITALS — BP 116/72 | HR 102 | Ht 70.0 in | Wt 187.0 lb

## 2020-12-04 DIAGNOSIS — M75102 Unspecified rotator cuff tear or rupture of left shoulder, not specified as traumatic: Secondary | ICD-10-CM

## 2020-12-04 DIAGNOSIS — R7989 Other specified abnormal findings of blood chemistry: Secondary | ICD-10-CM

## 2020-12-04 DIAGNOSIS — M12812 Other specific arthropathies, not elsewhere classified, left shoulder: Secondary | ICD-10-CM | POA: Diagnosis not present

## 2020-12-04 NOTE — Assessment & Plan Note (Signed)
The patient did make improvement after the injection.  We will continue to monitor and if necessary can repeat injections as necessary.  Follow-up again in 3 months

## 2020-12-04 NOTE — Assessment & Plan Note (Signed)
Patient is doing much better overall.  Has made significant progress.  Patient is likely willing to continue to make improvement.  Patient did still have some low testosterone and will refer to endocrinology for further evaluation but could be secondary to hemochromatosis.  Discussed with patient that there is other treatment options we can do including different injections for some of his musculoskeletal complaints but at the moment patient feels like he is making improvement.  Does have the gabapentin for breakthrough pain and help him with any nighttime pain.  Follow-up with me again 2 to 3 months.

## 2020-12-04 NOTE — Assessment & Plan Note (Addendum)
Not significantly low but lower than would be anticipated.  Could be secondary to the hemochromatosis and will refer patient to endocrinology to discuss potential supplementation.  Total time discussed with patient, referring previous labs as well as referring outside notes including oncology greater than 31 minutes.

## 2020-12-04 NOTE — Patient Instructions (Addendum)
Thanks for making my day Referral to Endocrinology My feeling is most of symptoms will continue to get better DHEA 50mg  daily for 4 weeks then 2 weeks off,  then see how you feel Wear glove with playing golf thicker grip See you again in 2-3 months

## 2020-12-18 ENCOUNTER — Inpatient Hospital Stay: Payer: BC Managed Care – PPO | Attending: Hematology and Oncology

## 2020-12-18 ENCOUNTER — Inpatient Hospital Stay: Payer: BC Managed Care – PPO

## 2020-12-18 ENCOUNTER — Other Ambulatory Visit: Payer: Self-pay

## 2020-12-18 LAB — CBC WITH DIFFERENTIAL/PLATELET
Abs Immature Granulocytes: 0.01 10*3/uL (ref 0.00–0.07)
Basophils Absolute: 0.1 10*3/uL (ref 0.0–0.1)
Basophils Relative: 2 %
Eosinophils Absolute: 0.1 10*3/uL (ref 0.0–0.5)
Eosinophils Relative: 2 %
HCT: 35.9 % — ABNORMAL LOW (ref 39.0–52.0)
Hemoglobin: 12.5 g/dL — ABNORMAL LOW (ref 13.0–17.0)
Immature Granulocytes: 0 %
Lymphocytes Relative: 36 %
Lymphs Abs: 1.4 10*3/uL (ref 0.7–4.0)
MCH: 32.6 pg (ref 26.0–34.0)
MCHC: 34.8 g/dL (ref 30.0–36.0)
MCV: 93.7 fL (ref 80.0–100.0)
Monocytes Absolute: 0.5 10*3/uL (ref 0.1–1.0)
Monocytes Relative: 12 %
Neutro Abs: 1.9 10*3/uL (ref 1.7–7.7)
Neutrophils Relative %: 48 %
Platelets: 210 10*3/uL (ref 150–400)
RBC: 3.83 MIL/uL — ABNORMAL LOW (ref 4.22–5.81)
RDW: 10.9 % — ABNORMAL LOW (ref 11.5–15.5)
WBC: 4 10*3/uL (ref 4.0–10.5)
nRBC: 0 % (ref 0.0–0.2)

## 2020-12-18 NOTE — Progress Notes (Signed)
Keith Bullock presents today for phlebotomy per MD orders. Phlebotomy procedure started at 0847 and ended at 42. A 16G Phlebotomy kit was used to the R AC.  537 grams removed. Patient given beverage. Patient observed for 30 minutes after procedure without any incident. Patient tolerated procedure well. IV needle removed intact. RN educated pt on aftercare and pt verbalized understanding of information provided.

## 2020-12-18 NOTE — Patient Instructions (Signed)

## 2020-12-19 ENCOUNTER — Encounter: Payer: Self-pay | Admitting: Family Medicine

## 2020-12-30 ENCOUNTER — Other Ambulatory Visit: Payer: Self-pay | Admitting: Family Medicine

## 2020-12-31 NOTE — Progress Notes (Signed)
[place] NOTE  Patient Care Team: Lorrene Reid, PA-C as PCP - General Wilford Corner, MD as Consulting Physician (Gastroenterology) Warren Danes, PA-C as Physician Assistant (Dermatology) Carolan Clines, MD (Inactive) as Consulting Physician (Urology) [name], MD as Consulting Physician (Infectious Diseases)  CHIEF COMPLAINTS/PURPOSE OF CONSULTATION:  Iron overload disorder  ASSESSMENT & PLAN:    This is a very pleasant 60 year old male patient with iron overload disorder referred to hematology for additional recommendations regarding possible hemochromatosis and treatment.  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 Continue phlebotomy monthly, target ferritin of 50-100, every 3 months CMP.  Last ferritin was 1300 back in September 2022.  He is overall doing phlebotomy at a frequency of every 2 to 4 weeks with hematocrit permitting.  He had his last phlebotomy here at the end of November, also donates at right across from time to time. Physical examination today without any concerns. I have recommended that he continue every 2-week phlebotomy if hematocrit is greater than 36.  Instructions placed to schedule days phlebotomies ahead. We will also proceed with CBC before every infusion and ferritin once a month. He will return to clinic for follow-up with me in about 4 months with repeat CBC, CMP, ferritin.  Given his family history of ovarian cancer in mom and colon cancer in dad, also discussed about a genetics referral , genetic testing negative, no clinically significant variants detected. Age-appropriate cancer screening advised.   We have discussed to avoid alcohol, other hepatotoxins including cooking and iron pots and pans as well as regular Tylenol use.  He expressed understanding of these recommendations.  Thank you for consulting [place] in the care of this patient.  Please do not hesitate to contact us  with any additional questions or concerns.   HISTORY OF PRESENTING ILLNESS:   Keith Bullock 60 y.o. male is here because of Iron overload disorder.  This is a very pleasant 60 year old male patient with hypertension referred to hematology for evaluation of iron overload disorder/hemochromatosis. Since last visit, he has been having phlebotomies at a frequency of every 2 to 4 weeks.  He has been tolerating them very well.  He feels very well doing the phlebotomies on a regular basis.  He feels more energetic, have his appetite is better.  He would like to continue them on an every 2-week frequency if possible.  He denies any change in bowel habits.  No hematochezia or melena.  He is aware of other hepatotoxins and the need to avoid excessive consumption of alcohol. Rest of the pertinent 10 point ROS reviewed and negative.  MEDICAL HISTORY:  Past Medical History:  Diagnosis Date   Family history of colon cancer 10/18/2020   Family history of ovarian cancer 10/18/2020   Hypertension    MRSA cellulitis     SURGICAL HISTORY: Past Surgical History:  Procedure Laterality Date   INSERTION OF MESH N/A 02/23/2017   Procedure: INSERTION OF MESH;  Surgeon: Ralene Ok, MD;  Location: [facility];  Service: General;  Laterality: N/A;   ROTATOR CUFF REPAIR Left    UMBILICAL HERNIA REPAIR N/A 02/23/2017   Procedure: LAPAROSCOPIC UMBILICAL HERNIA REPAIR WITH MESH;  Surgeon: Ralene Ok, MD;  Location: [facility];  Service: General;  Laterality: N/A;    SOCIAL HISTORY: Social History   Socioeconomic History   Marital status: Married    Spouse name: Not on file   Number of children: Not on file   Years of  education: Not on file   Highest education level: Not on file  Occupational History   Not on file  Tobacco Use   Smoking status: Never   Smokeless tobacco: Never  Vaping Use   Vaping Use: Never used  Substance and Sexual Activity   Alcohol use: Yes    Comment: 2-3 a day   Drug use: No    Sexual activity: Yes  Other Topics Concern   Not on file  Social History Narrative   Not on file   Social Determinants of Health   Financial Resource Strain: Not on file  Food Insecurity: Not on file  Transportation Needs: Not on file  Physical Activity: Not on file  Stress: Not on file  Social Connections: Not on file  Intimate Partner Violence: Not on file    FAMILY HISTORY: Family History  Problem Relation Age of Onset   Ovarian cancer Mother 83   Depression Mother    Hypertension Mother    Colon cancer Father 75   Stomach cancer Paternal Aunt        d. 22   Diabetes Paternal Grandfather     ALLERGIES:  is allergic to flomax [tamsulosin hcl].  MEDICATIONS:  Current Outpatient Medications  Medication Sig Dispense Refill   amLODipine (NORVASC) 10 MG tablet TAKE 1 TABLET BY MOUTH ONCE DAILY 90 tablet 0   Biotin 5000 MCG TABS Take 1 tablet by mouth daily.     cetirizine (ZYRTEC) 10 MG tablet Take 10 mg by mouth daily as needed for allergies.     Cyanocobalamin (B-12) 500 MCG TABS Take 1 tablet by mouth daily.     cyclobenzaprine (FLEXERIL) 10 MG tablet Take 1 tablet (10 mg total) by mouth 3 (three) times daily as needed for muscle spasms. 30 tablet 0   ezetimibe (ZETIA) 10 MG tablet TAKE 1 TABLET BY MOUTH AT BEDTIME. 90 tablet 0   Famotidine-Ca Carb-Mag Hydrox (PEPCID COMPLETE PO) Take 1 tablet by mouth daily as needed (acid reflux).     gabapentin (NEURONTIN) 100 MG capsule TAKE 2 CAPSULES BY MOUTH AT BEDTIME. 180 capsule 0   hydrOXYzine (ATARAX/VISTARIL) 10 MG tablet Take 1 tablet (10 mg total) by mouth 3 (three) times daily as needed. 270 tablet 0   Multiple Vitamin (MULTIVITAMIN WITH MINERALS) TABS tablet Take 1 tablet by mouth daily.     Naphazoline-Pheniramine 0.027-0.315 % SOLN Apply 1 drop to eye daily as needed (dry eyes).     naproxen sodium (ANAPROX) 220 MG tablet Take 220 mg by mouth daily as needed (pain).      omega-3 acid ethyl esters (LOVAZA) 1 g capsule  Take 2 capsules (2 g total) by mouth 2 (two) times daily. 360 capsule 0   sildenafil (REVATIO) 20 MG tablet Take 3 tablets (60 mg total) by mouth daily as needed (erectile dysfunction). 30 tablet 0   traMADol (ULTRAM) 50 MG tablet Take 1 tablet (50 mg total) by mouth every 6 (six) hours as needed. 30 tablet 0   valsartan-hydrochlorothiazide (DIOVAN-HCT) 320-25 MG tablet Take 1 tablet by mouth daily. 90 tablet 0   No current facility-administered medications for this visit.    PHYSICAL EXAMINATION: ECOG PERFORMANCE STATUS: 1 - Symptomatic but completely ambulatory  Vitals:   01/01/21 0958  BP: (!) 147/89  Pulse: (!) 105  Resp: 17  SpO2: 97%    Filed Weights   01/01/21 0958  Weight: 195 lb (88.5 kg)    GENERAL:alert, no distress and comfortable SKIN: skin color, texture,  turgor are normal, no rashes or significant lesions EYES: normal, conjunctiva are pink and non-injected, sclera clear OROPHARYNX:no exudate, no erythema and lips, buccal mucosa, and tongue normal  NECK: supple, thyroid normal size, non-tender, without nodularity LYMPH:  no palpable lymphadenopathy in the cervical LUNGS: clear to auscultation and percussion with normal breathing effort HEART: regular rate & rhythm and no murmurs and no lower extremity edema ABDOMEN:abdomen soft, non-tender and normal bowel sounds Musculoskeletal:no cyanosis of digits and no clubbing  PSYCH: alert & oriented x 3 with fluent speech NEURO: no focal motor/sensory deficits  LABORATORY DATA:  I have reviewed the data as listed Lab Results  Component Value Date   WBC 3.1 (L) 01/01/2021   HGB 10.3 (L) 01/01/2021   HCT 31.0 (L) 01/01/2021   MCV 92.5 01/01/2021   PLT 213 01/01/2021     Chemistry      Component Value Date/Time   NA 127 (L) 09/26/2020 0959   NA 134 06/19/2020 1118   K 3.6 09/26/2020 0959   CL 90 (L) 09/26/2020 0959   CO2 26 09/26/2020 0959   BUN 6 09/26/2020 0959   BUN 12 06/19/2020 1118   CREATININE 0.93  09/26/2020 0959   CREATININE 1.05 01/07/2016 0953   GLU 113 09/01/2016 0000      Component Value Date/Time   CALCIUM 9.3 09/26/2020 0959   ALKPHOS 66 09/26/2020 0959   AST 110 (H) 09/26/2020 0959   ALT 65 (H) 09/26/2020 0959   BILITOT 1.2 09/26/2020 0959   BILITOT 0.7 06/19/2020 1118      RADIOGRAPHIC STUDIES: I have personally reviewed the radiological images as listed and agreed with the findings in the report. No results found.  All questions were answered. The patient knows to call the clinic with any problems, questions or concerns. I spent 20 minutes in the care of this patient including history, review of records, counseling and coordination of care.    Benay Pike, MD 01/01/2021 10:43 AM

## 2021-01-01 ENCOUNTER — Inpatient Hospital Stay: Payer: BC Managed Care – PPO | Attending: Hematology and Oncology

## 2021-01-01 ENCOUNTER — Other Ambulatory Visit: Payer: Self-pay

## 2021-01-01 ENCOUNTER — Inpatient Hospital Stay (HOSPITAL_BASED_OUTPATIENT_CLINIC_OR_DEPARTMENT_OTHER): Payer: BC Managed Care – PPO | Admitting: Hematology and Oncology

## 2021-01-01 ENCOUNTER — Encounter: Payer: Self-pay | Admitting: Hematology and Oncology

## 2021-01-01 DIAGNOSIS — Z818 Family history of other mental and behavioral disorders: Secondary | ICD-10-CM | POA: Insufficient documentation

## 2021-01-01 DIAGNOSIS — Z8249 Family history of ischemic heart disease and other diseases of the circulatory system: Secondary | ICD-10-CM | POA: Insufficient documentation

## 2021-01-01 DIAGNOSIS — Z800 Family history of malignant neoplasm of digestive organs: Secondary | ICD-10-CM | POA: Insufficient documentation

## 2021-01-01 DIAGNOSIS — Z8041 Family history of malignant neoplasm of ovary: Secondary | ICD-10-CM | POA: Insufficient documentation

## 2021-01-01 DIAGNOSIS — Z833 Family history of diabetes mellitus: Secondary | ICD-10-CM | POA: Insufficient documentation

## 2021-01-01 DIAGNOSIS — I10 Essential (primary) hypertension: Secondary | ICD-10-CM | POA: Diagnosis not present

## 2021-01-01 DIAGNOSIS — Z79899 Other long term (current) drug therapy: Secondary | ICD-10-CM | POA: Insufficient documentation

## 2021-01-01 LAB — CBC (CANCER CENTER ONLY)
HCT: 31 % — ABNORMAL LOW (ref 39.0–52.0)
Hemoglobin: 10.3 g/dL — ABNORMAL LOW (ref 13.0–17.0)
MCH: 30.7 pg (ref 26.0–34.0)
MCHC: 33.2 g/dL (ref 30.0–36.0)
MCV: 92.5 fL (ref 80.0–100.0)
Platelet Count: 213 10*3/uL (ref 150–400)
RBC: 3.35 MIL/uL — ABNORMAL LOW (ref 4.22–5.81)
RDW: 11.4 % — ABNORMAL LOW (ref 11.5–15.5)
WBC Count: 3.1 10*3/uL — ABNORMAL LOW (ref 4.0–10.5)
nRBC: 0 % (ref 0.0–0.2)

## 2021-01-01 LAB — DIFFERENTIAL
Abs Immature Granulocytes: 0.01 10*3/uL (ref 0.00–0.07)
Basophils Absolute: 0.1 10*3/uL (ref 0.0–0.1)
Basophils Relative: 2 %
Eosinophils Absolute: 0.1 10*3/uL (ref 0.0–0.5)
Eosinophils Relative: 2 %
Immature Granulocytes: 0 %
Lymphocytes Relative: 30 %
Lymphs Abs: 0.9 10*3/uL (ref 0.7–4.0)
Monocytes Absolute: 0.4 10*3/uL (ref 0.1–1.0)
Monocytes Relative: 12 %
Neutro Abs: 1.7 10*3/uL (ref 1.7–7.7)
Neutrophils Relative %: 54 %

## 2021-01-02 ENCOUNTER — Other Ambulatory Visit: Payer: Self-pay | Admitting: Hematology and Oncology

## 2021-01-08 ENCOUNTER — Inpatient Hospital Stay: Payer: BC Managed Care – PPO

## 2021-01-08 ENCOUNTER — Other Ambulatory Visit: Payer: Self-pay

## 2021-01-08 DIAGNOSIS — Z818 Family history of other mental and behavioral disorders: Secondary | ICD-10-CM | POA: Diagnosis not present

## 2021-01-08 DIAGNOSIS — Z8249 Family history of ischemic heart disease and other diseases of the circulatory system: Secondary | ICD-10-CM | POA: Diagnosis not present

## 2021-01-08 DIAGNOSIS — I10 Essential (primary) hypertension: Secondary | ICD-10-CM | POA: Diagnosis not present

## 2021-01-08 DIAGNOSIS — Z800 Family history of malignant neoplasm of digestive organs: Secondary | ICD-10-CM | POA: Diagnosis not present

## 2021-01-08 DIAGNOSIS — Z8041 Family history of malignant neoplasm of ovary: Secondary | ICD-10-CM | POA: Diagnosis not present

## 2021-01-08 DIAGNOSIS — Z79899 Other long term (current) drug therapy: Secondary | ICD-10-CM | POA: Diagnosis not present

## 2021-01-08 DIAGNOSIS — Z833 Family history of diabetes mellitus: Secondary | ICD-10-CM | POA: Diagnosis not present

## 2021-01-08 LAB — CBC WITH DIFFERENTIAL/PLATELET
Abs Immature Granulocytes: 0.01 10*3/uL (ref 0.00–0.07)
Basophils Absolute: 0.1 10*3/uL (ref 0.0–0.1)
Basophils Relative: 3 %
Eosinophils Absolute: 0.1 10*3/uL (ref 0.0–0.5)
Eosinophils Relative: 3 %
HCT: 32.5 % — ABNORMAL LOW (ref 39.0–52.0)
Hemoglobin: 10.9 g/dL — ABNORMAL LOW (ref 13.0–17.0)
Immature Granulocytes: 0 %
Lymphocytes Relative: 34 %
Lymphs Abs: 1.2 10*3/uL (ref 0.7–4.0)
MCH: 30.4 pg (ref 26.0–34.0)
MCHC: 33.5 g/dL (ref 30.0–36.0)
MCV: 90.5 fL (ref 80.0–100.0)
Monocytes Absolute: 0.5 10*3/uL (ref 0.1–1.0)
Monocytes Relative: 13 %
Neutro Abs: 1.7 10*3/uL (ref 1.7–7.7)
Neutrophils Relative %: 47 %
Platelets: 237 10*3/uL (ref 150–400)
RBC: 3.59 MIL/uL — ABNORMAL LOW (ref 4.22–5.81)
RDW: 11.7 % (ref 11.5–15.5)
WBC: 3.6 10*3/uL — ABNORMAL LOW (ref 4.0–10.5)
nRBC: 0 % (ref 0.0–0.2)

## 2021-01-08 LAB — FERRITIN: Ferritin: 27 ng/mL (ref 24–336)

## 2021-01-08 NOTE — Progress Notes (Signed)
Patient did not require a phlebotomy today due to Hct of 32.5 which is below the set parameter. Labs were reviewed with the patient and he verbalized understanding. No other questions or concerns.

## 2021-01-09 ENCOUNTER — Encounter: Payer: Self-pay | Admitting: Hematology and Oncology

## 2021-01-22 ENCOUNTER — Inpatient Hospital Stay: Payer: BC Managed Care – PPO

## 2021-01-22 ENCOUNTER — Other Ambulatory Visit: Payer: Self-pay | Admitting: *Deleted

## 2021-01-22 NOTE — Progress Notes (Signed)
Per My Chart message - appts canceled until next MD follow up with lab and possible phlebotomy.

## 2021-02-05 ENCOUNTER — Other Ambulatory Visit: Payer: BC Managed Care – PPO

## 2021-02-19 ENCOUNTER — Other Ambulatory Visit: Payer: BC Managed Care – PPO

## 2021-03-05 ENCOUNTER — Other Ambulatory Visit: Payer: BC Managed Care – PPO

## 2021-03-19 ENCOUNTER — Other Ambulatory Visit: Payer: BC Managed Care – PPO

## 2021-03-25 ENCOUNTER — Ambulatory Visit (INDEPENDENT_AMBULATORY_CARE_PROVIDER_SITE_OTHER): Payer: BC Managed Care – PPO | Admitting: Internal Medicine

## 2021-03-25 ENCOUNTER — Other Ambulatory Visit: Payer: Self-pay

## 2021-03-25 ENCOUNTER — Encounter: Payer: Self-pay | Admitting: Internal Medicine

## 2021-03-25 VITALS — BP 126/78 | HR 74 | Temp 98.0°F | Resp 16 | Ht 70.0 in | Wt 196.0 lb

## 2021-03-25 DIAGNOSIS — D500 Iron deficiency anemia secondary to blood loss (chronic): Secondary | ICD-10-CM

## 2021-03-25 DIAGNOSIS — R7989 Other specified abnormal findings of blood chemistry: Secondary | ICD-10-CM

## 2021-03-25 DIAGNOSIS — R739 Hyperglycemia, unspecified: Secondary | ICD-10-CM | POA: Diagnosis not present

## 2021-03-25 DIAGNOSIS — I10 Essential (primary) hypertension: Secondary | ICD-10-CM | POA: Diagnosis not present

## 2021-03-25 DIAGNOSIS — Z0001 Encounter for general adult medical examination with abnormal findings: Secondary | ICD-10-CM

## 2021-03-25 DIAGNOSIS — D539 Nutritional anemia, unspecified: Secondary | ICD-10-CM

## 2021-03-25 DIAGNOSIS — K7689 Other specified diseases of liver: Secondary | ICD-10-CM

## 2021-03-25 DIAGNOSIS — Z1211 Encounter for screening for malignant neoplasm of colon: Secondary | ICD-10-CM

## 2021-03-25 DIAGNOSIS — Z0000 Encounter for general adult medical examination without abnormal findings: Secondary | ICD-10-CM

## 2021-03-25 DIAGNOSIS — R5382 Chronic fatigue, unspecified: Secondary | ICD-10-CM

## 2021-03-25 MED ORDER — VALSARTAN 320 MG PO TABS
320.0000 mg | ORAL_TABLET | Freq: Every day | ORAL | 0 refills | Status: DC
Start: 2021-03-25 — End: 2021-06-25

## 2021-03-25 NOTE — Progress Notes (Unsigned)
Subjective:  Patient ID: Keith Bullock, male    DOB: 08-14-1960  Age: 62 y.o. MRN: [id number]  CC: Annual Exam, Anemia, and Hypertension  This visit occurred during the SARS-CoV-2 public health emergency.  Safety protocols were in place, including screening questions prior to the visit, additional usage of staff PPE, and extensive cleaning of exam room while observing appropriate contact time as indicated for disinfecting solutions.    HPI Keith Bullock presents for a CPX and to establish.    History Vern has a past medical history of Family history of colon cancer (10/18/2020), Family history of ovarian cancer (10/18/2020), Hypertension, and MRSA cellulitis.   He has a past surgical history that includes Rotator cuff repair (Left); Umbilical hernia repair (N/A, 02/23/2017); and Insertion of mesh (N/A, 02/23/2017).   His family history includes Colon cancer (age of onset: 20) in his father; Depression in his mother; Diabetes in his paternal grandfather; Hypertension in his mother; Ovarian cancer (age of onset: 82) in his mother; Stomach cancer in his paternal aunt.He reports that he has never smoked. He has never used smokeless tobacco. He reports that he does not currently use alcohol. He reports that he does not use drugs.  Outpatient Medications Prior to Visit  Medication Sig Dispense Refill   Biotin 5000 MCG TABS Take 1 tablet by mouth daily.     cetirizine (ZYRTEC) 10 MG tablet Take 10 mg by mouth daily as needed for allergies.     Cyanocobalamin (B-12) 500 MCG TABS Take 1 tablet by mouth daily.     ezetimibe (ZETIA) 10 MG tablet TAKE 1 TABLET BY MOUTH AT BEDTIME. 90 tablet 0   Famotidine-Ca Carb-Mag Hydrox (PEPCID COMPLETE PO) Take 1 tablet by mouth daily as needed (acid reflux).     gabapentin (NEURONTIN) 100 MG capsule TAKE 2 CAPSULES BY MOUTH AT BEDTIME. 180 capsule 0   hydrOXYzine (ATARAX/VISTARIL) 10 MG tablet Take 1 tablet (10 mg total) by mouth 3 (three) times daily as  needed. 270 tablet 0   Multiple Vitamin (MULTIVITAMIN WITH MINERALS) TABS tablet Take 1 tablet by mouth daily.     omega-3 acid ethyl esters (LOVAZA) 1 g capsule Take 2 capsules (2 g total) by mouth 2 (two) times daily. 360 capsule 0   sildenafil (REVATIO) 20 MG tablet Take 3 tablets (60 mg total) by mouth daily as needed (erectile dysfunction). 30 tablet 0   traMADol (ULTRAM) 50 MG tablet Take 1 tablet (50 mg total) by mouth every 6 (six) hours as needed. 30 tablet 0   amLODipine (NORVASC) 10 MG tablet TAKE 1 TABLET BY MOUTH ONCE DAILY 90 tablet 0   cyclobenzaprine (FLEXERIL) 10 MG tablet Take 1 tablet (10 mg total) by mouth 3 (three) times daily as needed for muscle spasms. 30 tablet 0   Naphazoline-Pheniramine 0.027-0.315 % SOLN Apply 1 drop to eye daily as needed (dry eyes).     naproxen sodium (ANAPROX) 220 MG tablet Take 220 mg by mouth daily as needed (pain).      valsartan-hydrochlorothiazide (DIOVAN-HCT) 320-25 MG tablet Take 1 tablet by mouth daily. 90 tablet 0   No facility-administered medications prior to visit.    ROS Review of Systems  Constitutional:  Positive for appetite change (decreased) and fatigue. Negative for chills, diaphoresis, fever and unexpected weight change.  HENT: Negative.    Eyes: Negative.   Respiratory:  Negative for cough, chest tightness, shortness of breath and wheezing.   Cardiovascular:  Negative for chest pain, palpitations and  leg swelling.  Gastrointestinal:  Negative for abdominal pain, blood in stool, constipation, diarrhea, nausea and vomiting.  Genitourinary: Negative.  Negative for difficulty urinating.       Low libido and ED  Musculoskeletal:  Positive for arthralgias. Negative for myalgias.  Skin: Negative.  Negative for color change, pallor and rash.  Neurological:  Positive for dizziness. Negative for syncope, weakness, light-headedness, numbness and headaches.  Hematological:  Negative for adenopathy. Does not bruise/bleed easily.   Psychiatric/Behavioral:  Negative for confusion.    Objective:  BP 126/78 (BP Location: Right Arm, Patient Position: Sitting, Cuff Size: Large)    Pulse 74    Temp 98 F (36.7 C) (Oral)    Resp 16    Ht '5\' 10"'$  (1.778 m)    Wt 196 lb (88.9 kg)    SpO2 99%    BMI 28.12 kg/m   Physical Exam Vitals reviewed.  HENT:     Nose: Nose normal.     Mouth/Throat:     Mouth: Mucous membranes are moist.  Eyes:     General: No scleral icterus.    Conjunctiva/sclera: Conjunctivae normal.  Cardiovascular:     Rate and Rhythm: Normal rate and regular rhythm.     Heart sounds: No murmur heard.    Comments: EKG- NSR, 72 bpm Normal EKG Pulmonary:     Effort: Pulmonary effort is normal.     Breath sounds: No stridor. No wheezing, rhonchi or rales.  Abdominal:     General: Abdomen is flat.     Palpations: There is no mass.     Tenderness: There is no abdominal tenderness. There is no guarding or rebound.     Hernia: No hernia is present.  Musculoskeletal:        General: Normal range of motion.     Cervical back: Neck supple.     Right lower leg: No edema.     Left lower leg: No edema.  Lymphadenopathy:     Cervical: No cervical adenopathy.  Skin:    General: Skin is warm and dry.     Coloration: Skin is not pale.  Neurological:     General: No focal deficit present.     Mental Status: He is alert. Mental status is at baseline.  Psychiatric:        Mood and Affect: Mood normal.        Behavior: Behavior normal.    Lab Results  Component Value Date   WBC 3.9 (L) 03/26/2021   HGB 11.4 (L) 03/26/2021   HCT 35.5 (L) 03/26/2021   PLT 302.0 03/26/2021   GLUCOSE 158 (H) 03/26/2021   CHOL 111 03/26/2021   TRIG 99.0 03/26/2021   HDL 38.70 (L) 03/26/2021   LDLCALC 52 03/26/2021   ALT 167 (H) 03/26/2021   AST 146 (H) 03/26/2021   NA 134 (L) 03/26/2021   K 4.6 03/26/2021   CL 98 03/26/2021   CREATININE 1.06 03/26/2021   BUN 17 03/26/2021   CO2 25 03/26/2021   TSH 2.60 03/26/2021    PSA 3.23 03/26/2021   INR 1.1 (H) 09/26/2020   HGBA1C 5.3 03/27/2021    From 2012.... IMPRESSION:   1.  Diffuse fatty infiltration of the liver.  Several liver cysts.  No ductal dilatation.  2.  No gallstones.  3.  Portions of the pancreas are obscured by bowel gas.   Original Report Authenticated By: Joretta Bachelor, M.D.    Assessment & Plan:   Dewain was seen today  for annual exam, anemia and hypertension.  Diagnoses and all orders for this visit:  Hypertension, unspecified type -     EKG 12-Lead  Deficiency anemia -     CBC with Differential/Platelet; Future -     Vitamin B12; Future -     Folate; Future -     Vitamin B1; Future -     Zinc; Future -     Zinc -     Vitamin B1 -     Folate -     Vitamin B12 -     CBC with Differential/Platelet -     IBC + Ferritin; Future -     CBC with Differential/Platelet; Future -     Vitamin B12; Future -     Folate; Future -     Vitamin B1; Future  Encounter for general adult medical examination with abnormal findings -     Lipid panel; Future -     PSA; Future -     PSA -     Lipid panel -     Lipid panel; Future -     PSA; Future  Primary hypertension -     Basic metabolic panel; Future -     Hepatic function panel; Future -     valsartan (DIOVAN) 320 MG tablet; Take 1 tablet (320 mg total) by mouth daily. -     Hepatic function panel -     Basic metabolic panel -     Hepatic function panel; Future -     Basic metabolic panel; Future  Screen for colon cancer -     Ambulatory referral to Gastroenterology -     Urinalysis, Routine w reflex microscopic; Future  Chronic fatigue -     TSH; Future -     Testosterone Total,Free,Bio, Males; Future -     Testosterone Total,Free,Bio, Males -     TSH -     Testosterone Total,Free,Bio, Males; Future -     TSH; Future  Hyperglycemia -     Fructosamine; Future -     Hemoglobin A1c; Future  Iron deficiency anemia due to chronic blood loss -     Discontinue: Ferric  Maltol (ACCRUFER) 30 MG CAPS; Take 1 capsule by mouth in the morning and at bedtime.  Elevated LFTs -     MR Abdomen W Wo Contrast; Future  Liver cyst -     MR Abdomen W Wo Contrast; Future   I have discontinued Alice Reichert. [place] "Mark"'s naproxen sodium, Naphazoline-Pheniramine, cyclobenzaprine, valsartan-hydrochlorothiazide, amLODipine, and ACCRUFeR. I am also having him start on valsartan. Additionally, I am having him maintain his multivitamin with minerals, Famotidine-Ca Carb-Mag Hydrox (PEPCID COMPLETE PO), cetirizine, Biotin, B-12, omega-3 acid ethyl esters, traMADol, sildenafil, ezetimibe, hydrOXYzine, and gabapentin.  Meds ordered this encounter  Medications   valsartan (DIOVAN) 320 MG tablet    Sig: Take 1 tablet (320 mg total) by mouth daily.    Dispense:  90 tablet    Refill:  0   DISCONTD: Ferric Maltol (ACCRUFER) 30 MG CAPS    Sig: Take 1 capsule by mouth in the morning and at bedtime.    Dispense:  180 capsule    Refill:  1     Follow-up: No follow-ups on file.  Scarlette Calico, MD

## 2021-03-26 ENCOUNTER — Other Ambulatory Visit (INDEPENDENT_AMBULATORY_CARE_PROVIDER_SITE_OTHER): Payer: BC Managed Care – PPO

## 2021-03-26 DIAGNOSIS — Z0001 Encounter for general adult medical examination with abnormal findings: Secondary | ICD-10-CM

## 2021-03-26 DIAGNOSIS — R739 Hyperglycemia, unspecified: Secondary | ICD-10-CM | POA: Insufficient documentation

## 2021-03-26 DIAGNOSIS — R5382 Chronic fatigue, unspecified: Secondary | ICD-10-CM

## 2021-03-26 DIAGNOSIS — D500 Iron deficiency anemia secondary to blood loss (chronic): Secondary | ICD-10-CM | POA: Insufficient documentation

## 2021-03-26 DIAGNOSIS — Z125 Encounter for screening for malignant neoplasm of prostate: Secondary | ICD-10-CM | POA: Diagnosis not present

## 2021-03-26 DIAGNOSIS — Z1211 Encounter for screening for malignant neoplasm of colon: Secondary | ICD-10-CM | POA: Diagnosis not present

## 2021-03-26 DIAGNOSIS — I10 Essential (primary) hypertension: Secondary | ICD-10-CM

## 2021-03-26 DIAGNOSIS — D539 Nutritional anemia, unspecified: Secondary | ICD-10-CM | POA: Diagnosis not present

## 2021-03-26 LAB — CBC WITH DIFFERENTIAL/PLATELET
Basophils Absolute: 0.1 10*3/uL (ref 0.0–0.1)
Basophils Relative: 2 % (ref 0.0–3.0)
Eosinophils Absolute: 0.1 10*3/uL (ref 0.0–0.7)
Eosinophils Relative: 2.1 % (ref 0.0–5.0)
HCT: 35.5 % — ABNORMAL LOW (ref 39.0–52.0)
Hemoglobin: 11.4 g/dL — ABNORMAL LOW (ref 13.0–17.0)
Lymphocytes Relative: 24.8 % (ref 12.0–46.0)
Lymphs Abs: 1 10*3/uL (ref 0.7–4.0)
MCHC: 32.2 g/dL (ref 30.0–36.0)
MCV: 82.7 fl (ref 78.0–100.0)
Monocytes Absolute: 0.4 10*3/uL (ref 0.1–1.0)
Monocytes Relative: 10.1 % (ref 3.0–12.0)
Neutro Abs: 2.4 10*3/uL (ref 1.4–7.7)
Neutrophils Relative %: 61 % (ref 43.0–77.0)
Platelets: 302 10*3/uL (ref 150.0–400.0)
RBC: 4.29 Mil/uL (ref 4.22–5.81)
RDW: 15.3 % (ref 11.5–15.5)
WBC: 3.9 10*3/uL — ABNORMAL LOW (ref 4.0–10.5)

## 2021-03-26 LAB — BASIC METABOLIC PANEL
BUN: 17 mg/dL (ref 6–23)
CO2: 25 mEq/L (ref 19–32)
Calcium: 9.8 mg/dL (ref 8.4–10.5)
Chloride: 98 mEq/L (ref 96–112)
Creatinine, Ser: 1.06 mg/dL (ref 0.40–1.50)
GFR: 76.26 mL/min (ref >60.00)
Glucose, Bld: 158 mg/dL — ABNORMAL HIGH (ref 70–99)
Potassium: 4.6 mEq/L (ref 3.5–5.1)
Sodium: 134 mEq/L — ABNORMAL LOW (ref 135–145)

## 2021-03-26 LAB — URINALYSIS, ROUTINE W REFLEX MICROSCOPIC
Bilirubin Urine: NEGATIVE
Hgb urine dipstick: NEGATIVE
Ketones, ur: NEGATIVE
Leukocytes,Ua: NEGATIVE
Nitrite: NEGATIVE
RBC / HPF: NONE SEEN (ref 0–?)
Specific Gravity, Urine: 1.015 (ref 1.000–1.030)
Total Protein, Urine: NEGATIVE
Urine Glucose: 100 — AB
Urobilinogen, UA: 0.2 (ref 0.0–1.0)
pH: 6 (ref 5.0–8.0)

## 2021-03-26 LAB — LIPID PANEL
Cholesterol: 111 mg/dL (ref 0–200)
HDL: 38.7 mg/dL — ABNORMAL LOW (ref >39.00)
LDL Cholesterol: 52 mg/dL (ref 0–99)
NonHDL: 72.09
Total CHOL/HDL Ratio: 3
Triglycerides: 99 mg/dL (ref 0.0–149.0)
VLDL: 19.8 mg/dL (ref 0.0–40.0)

## 2021-03-26 LAB — TSH: TSH: 2.6 u[IU]/mL (ref 0.35–5.50)

## 2021-03-26 LAB — IBC + FERRITIN
Ferritin: 20.5 ng/mL — ABNORMAL LOW (ref 22.0–322.0)
Iron: 24 ug/dL — ABNORMAL LOW (ref 42–165)
Saturation Ratios: 4.7 % — ABNORMAL LOW (ref 20.0–50.0)
TIBC: 508.2 ug/dL — ABNORMAL HIGH (ref 250.0–450.0)
Transferrin: 363 mg/dL — ABNORMAL HIGH (ref 212.0–360.0)

## 2021-03-26 LAB — HEPATIC FUNCTION PANEL
ALT: 167 U/L — ABNORMAL HIGH (ref 0–53)
AST: 146 U/L — ABNORMAL HIGH (ref 0–37)
Albumin: 4.2 g/dL (ref 3.5–5.2)
Alkaline Phosphatase: 62 U/L (ref 39–117)
Bilirubin, Direct: 0.2 mg/dL (ref 0.0–0.3)
Total Bilirubin: 0.4 mg/dL (ref 0.2–1.2)
Total Protein: 6.5 g/dL (ref 6.0–8.3)

## 2021-03-26 LAB — VITAMIN B12: Vitamin B-12: >1504 pg/mL — ABNORMAL HIGH (ref 211–911)

## 2021-03-26 LAB — PSA: PSA: 3.23 ng/mL (ref 0.10–4.00)

## 2021-03-26 LAB — FOLATE: Folate: >24.2 ng/mL (ref >5.9)

## 2021-03-26 MED ORDER — ACCRUFER 30 MG PO CAPS: 1.0000 | ORAL_CAPSULE | Freq: Two times a day (BID) | ORAL | 1 refills | Status: DC

## 2021-03-27 ENCOUNTER — Other Ambulatory Visit (INDEPENDENT_AMBULATORY_CARE_PROVIDER_SITE_OTHER): Payer: BC Managed Care – PPO

## 2021-03-27 DIAGNOSIS — R739 Hyperglycemia, unspecified: Secondary | ICD-10-CM

## 2021-03-27 LAB — HEMOGLOBIN A1C: Hgb A1c MFr Bld: 5.3 % (ref 4.6–6.5)

## 2021-03-28 DIAGNOSIS — K7689 Other specified diseases of liver: Secondary | ICD-10-CM | POA: Insufficient documentation

## 2021-03-30 LAB — TESTOSTERONE TOTAL,FREE,BIO, MALES
Albumin: 3.9 g/dL (ref 3.6–5.1)
Sex Hormone Binding: 53 nmol/L (ref 22–77)
Testosterone, Bioavailable: 77.8 ng/dL — ABNORMAL LOW (ref 110.0–575.0)
Testosterone, Free: 43.3 pg/mL — ABNORMAL LOW (ref 46.0–224.0)
Testosterone: 477 ng/dL (ref 250–827)

## 2021-03-30 LAB — VITAMIN B1: Vitamin B1 (Thiamine): 8 nmol/L (ref 8–30)

## 2021-03-31 ENCOUNTER — Encounter: Payer: Self-pay | Admitting: Internal Medicine

## 2021-04-02 ENCOUNTER — Other Ambulatory Visit: Payer: BC Managed Care – PPO

## 2021-04-02 LAB — FRUCTOSAMINE: Fructosamine: 247 umol/L (ref 205–285)

## 2021-04-16 ENCOUNTER — Other Ambulatory Visit: Payer: BC Managed Care – PPO

## 2021-04-18 ENCOUNTER — Ambulatory Visit
Admission: RE | Admit: 2021-04-18 | Discharge: 2021-04-18 | Disposition: A | Discharge status: Home / Self Care | Payer: BC Managed Care – PPO | Source: Ambulatory Visit | Attending: Internal Medicine | Admitting: Internal Medicine

## 2021-04-18 DIAGNOSIS — K862 Cyst of pancreas: Secondary | ICD-10-CM | POA: Diagnosis not present

## 2021-04-18 DIAGNOSIS — K7689 Other specified diseases of liver: Secondary | ICD-10-CM | POA: Diagnosis not present

## 2021-04-18 DIAGNOSIS — R7989 Other specified abnormal findings of blood chemistry: Secondary | ICD-10-CM

## 2021-04-18 MED ORDER — GADOBENATE DIMEGLUMINE 529 MG/ML IV SOLN
18.0000 mL | Freq: Once | INTRAVENOUS | Status: AC | PRN
  Administered 2021-04-18: 18 mL via INTRAVENOUS

## 2021-04-30 ENCOUNTER — Inpatient Hospital Stay: Payer: BC Managed Care – PPO | Attending: Hematology and Oncology

## 2021-04-30 ENCOUNTER — Encounter: Payer: Self-pay | Admitting: Hematology and Oncology

## 2021-04-30 ENCOUNTER — Other Ambulatory Visit: Payer: Self-pay

## 2021-04-30 ENCOUNTER — Inpatient Hospital Stay (HOSPITAL_BASED_OUTPATIENT_CLINIC_OR_DEPARTMENT_OTHER): Payer: BC Managed Care – PPO | Admitting: Hematology and Oncology

## 2021-04-30 ENCOUNTER — Inpatient Hospital Stay: Payer: BC Managed Care – PPO

## 2021-04-30 VITALS — BP 133/87 | HR 72 | Temp 98.1°F | Resp 16 | Ht 70.0 in | Wt 201.6 lb

## 2021-04-30 DIAGNOSIS — K862 Cyst of pancreas: Secondary | ICD-10-CM | POA: Insufficient documentation

## 2021-04-30 DIAGNOSIS — Z8614 Personal history of Methicillin resistant Staphylococcus aureus infection: Secondary | ICD-10-CM | POA: Insufficient documentation

## 2021-04-30 DIAGNOSIS — Z8041 Family history of malignant neoplasm of ovary: Secondary | ICD-10-CM | POA: Diagnosis not present

## 2021-04-30 DIAGNOSIS — I10 Essential (primary) hypertension: Secondary | ICD-10-CM | POA: Insufficient documentation

## 2021-04-30 DIAGNOSIS — Z833 Family history of diabetes mellitus: Secondary | ICD-10-CM | POA: Insufficient documentation

## 2021-04-30 DIAGNOSIS — Z79899 Other long term (current) drug therapy: Secondary | ICD-10-CM | POA: Insufficient documentation

## 2021-04-30 DIAGNOSIS — Z8249 Family history of ischemic heart disease and other diseases of the circulatory system: Secondary | ICD-10-CM | POA: Diagnosis not present

## 2021-04-30 DIAGNOSIS — K7689 Other specified diseases of liver: Secondary | ICD-10-CM | POA: Diagnosis not present

## 2021-04-30 DIAGNOSIS — Z800 Family history of malignant neoplasm of digestive organs: Secondary | ICD-10-CM | POA: Diagnosis not present

## 2021-04-30 LAB — CBC WITH DIFFERENTIAL/PLATELET
Abs Immature Granulocytes: 0 10*3/uL (ref 0.00–0.07)
Basophils Absolute: 0.1 10*3/uL (ref 0.0–0.1)
Basophils Relative: 2 %
Eosinophils Absolute: 0.1 10*3/uL (ref 0.0–0.5)
Eosinophils Relative: 3 %
HCT: 32.3 % — ABNORMAL LOW (ref 39.0–52.0)
Hemoglobin: 10 g/dL — ABNORMAL LOW (ref 13.0–17.0)
Immature Granulocytes: 0 %
Lymphocytes Relative: 40 %
Lymphs Abs: 1.3 10*3/uL (ref 0.7–4.0)
MCH: 24.4 pg — ABNORMAL LOW (ref 26.0–34.0)
MCHC: 31 g/dL (ref 30.0–36.0)
MCV: 79 fL — ABNORMAL LOW (ref 80.0–100.0)
Monocytes Absolute: 0.4 10*3/uL (ref 0.1–1.0)
Monocytes Relative: 12 %
Neutro Abs: 1.4 10*3/uL — ABNORMAL LOW (ref 1.7–7.7)
Neutrophils Relative %: 43 %
Platelets: 272 10*3/uL (ref 150–400)
RBC: 4.09 MIL/uL — ABNORMAL LOW (ref 4.22–5.81)
RDW: 14.6 % (ref 11.5–15.5)
WBC: 3.3 10*3/uL — ABNORMAL LOW (ref 4.0–10.5)
nRBC: 0 % (ref 0.0–0.2)

## 2021-04-30 LAB — FERRITIN: Ferritin: 9 ng/mL — ABNORMAL LOW (ref 24–336)

## 2021-04-30 NOTE — Progress Notes (Signed)
[place] ?CONSULT NOTE ? ?Patient Care Team: ?Janith Lima, MD as PCP - General (Internal Medicine) ?Wilford Corner, MD as Consulting Physician (Gastroenterology) ?Starlyn Skeans as Librarian, academic (Dermatology) ?Carolan Clines, MD (Inactive) as Consulting Physician (Urology) ?[name], MD as Consulting Physician (Infectious Diseases) ? ?CHIEF COMPLAINTS/PURPOSE OF CONSULTATION:  ?Iron overload disorder ? ?ASSESSMENT & PLAN:  ? ? ?This is a very pleasant 61 year old male patient with iron overload disorder referred to hematology for additional recommendations regarding possible hemochromatosis and treatment.  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 ?His last labs in March showed ferritin of 20, no indication for phlebotomy currently.  He was recommended to come back in about 8 weeks for a follow-up lab and 16-monthfollow-up with me.  He does not have to take any oral iron supplementation.  If not phlebotomized, his ferritin will continue to rise. ?We will follow-up on his labs from today as well. ?Given his family history of ovarian cancer in mom and colon cancer in dad, also discussed about a genetics referral , genetic testing negative, no clinically significant variants detected. ?We have previously and once again discussed to avoid alcohol, other hepatotoxins including cooking and iron pots and pans as well as regular Tylenol use.  He expressed understanding of these recommendations. ? ?Age-appropriate cancer screening advised ? ?Thank you for consulting [facility] the care of this patient.  Please do not hesitate to contact [facility] any additional questions or concerns. ? ? ?HISTORY OF PRESENTING ILLNESS:  ? ?HElinor Dodge89y.o. male is here because of Iron overload disorder. ? ?This is a very pleasant 61year old male patient with hypertension referred to hematology for evaluation of iron overload disorder/hemochromatosis. ? ?Mr.  WLevenhagenis doing really well.  He says his energy is very good, he has been eating well, gaining weight overall he is very thrilled about how he feels.  His last blood donation was back in March.  He says his PCP recommended to take some iron which he has not.  He otherwise is also going to schedule a colonoscopy soon.  He had an MRI abdomen which showed some IPMNs and recommendation was to repeat MRI in 1 year.  He mentioned that his PCP will be ordering his next years MRI. ?Rest of the pertinent 10 point ROS reviewed and negative. ? ?MEDICAL HISTORY:  ?Past Medical History:  ?Diagnosis Date  ? Family history of colon cancer 10/18/2020  ? Family history of ovarian cancer 10/18/2020  ? Hypertension   ? MRSA cellulitis   ? ? ?SURGICAL HISTORY: ?Past Surgical History:  ?Procedure Laterality Date  ? INSERTION OF MESH N/A 02/23/2017  ? Procedure: INSERTION OF MESH;  Surgeon: RRalene Ok MD;  [facility]  Service: General;  Laterality: N/A;  ? ROTATOR CUFF REPAIR Left   ? UMBILICAL HERNIA REPAIR N/A 02/23/2017  ? Procedure: LAPAROSCOPIC UMBILICAL HERNIA REPAIR WITH MESH;  Surgeon: RRalene Ok MD;  [facility]  Service: General;  Laterality: N/A;  ? ? ?SOCIAL HISTORY: ?Social History  ? ?Socioeconomic History  ? Marital status: Married  ?  Spouse name: Not on file  ? Number of children: Not on file  ? Years of education: Not on file  ? Highest education level: Not on file  ?Occupational History  ? Not on file  ?Tobacco Use  ? Smoking status: Never  ? Smokeless tobacco: Never  ?Vaping Use  ? Vaping Use: Never used  ?  Substance and Sexual Activity  ? Alcohol use: Not Currently  ?  Comment: quit 10 days ago  ? Drug use: No  ? Sexual activity: Yes  ?  Partners: Female  ?Other Topics Concern  ? Not on file  ?Social History Narrative  ? Not on file  ? ?Social Determinants of Health  ? ?Financial Resource Strain: Not on file  ?Food Insecurity: Not on file  ?Transportation Needs: Not on file  ?Physical Activity: Not  on file  ?Stress: Not on file  ?Social Connections: Not on file  ?Intimate Partner Violence: Not on file  ? ? ?FAMILY HISTORY: ?Family History  ?Problem Relation Age of Onset  ? Ovarian cancer Mother 71  ? Depression Mother   ? Hypertension Mother   ? Colon cancer Father 36  ? Stomach cancer Paternal Aunt   ?     d. 27  ? Diabetes Paternal Grandfather   ? ? ?ALLERGIES:  is allergic to flomax [tamsulosin hcl]. ? ?MEDICATIONS:  ?Current Outpatient Medications  ?Medication Sig Dispense Refill  ? Biotin 5000 MCG TABS Take 1 tablet by mouth daily.    ? cetirizine (ZYRTEC) 10 MG tablet Take 10 mg by mouth daily as needed for allergies.    ? Cyanocobalamin (B-12) 500 MCG TABS Take 1 tablet by mouth daily.    ? ezetimibe (ZETIA) 10 MG tablet TAKE 1 TABLET BY MOUTH AT BEDTIME. 90 tablet 0  ? Famotidine-Ca Carb-Mag Hydrox (PEPCID COMPLETE PO) Take 1 tablet by mouth daily as needed (acid reflux).    ? gabapentin (NEURONTIN) 100 MG capsule TAKE 2 CAPSULES BY MOUTH AT BEDTIME. 180 capsule 0  ? hydrOXYzine (ATARAX/VISTARIL) 10 MG tablet Take 1 tablet (10 mg total) by mouth 3 (three) times daily as needed. 270 tablet 0  ? Multiple Vitamin (MULTIVITAMIN WITH MINERALS) TABS tablet Take 1 tablet by mouth daily.    ? omega-3 acid ethyl esters (LOVAZA) 1 g capsule Take 2 capsules (2 g total) by mouth 2 (two) times daily. 360 capsule 0  ? traMADol (ULTRAM) 50 MG tablet Take 1 tablet (50 mg total) by mouth every 6 (six) hours as needed. 30 tablet 0  ? valsartan (DIOVAN) 320 MG tablet Take 1 tablet (320 mg total) by mouth daily. 90 tablet 0  ? ?No current facility-administered medications for this visit.  ? ? ?PHYSICAL EXAMINATION: ?ECOG PERFORMANCE STATUS: 1 - Symptomatic but completely ambulatory ? ?Vitals:  ? 04/30/21 0850  ?BP: 133/87  ?Pulse: 72  ?Resp: 16  ?Temp: 98.1 ?F (36.7 ?C)  ?SpO2: 100%  ? ? ?Filed Weights  ? 04/30/21 0850  ?Weight: 201 lb 9.6 oz (91.4 kg)  ? ?Physical Exam ?Constitutional:   ?   Appearance: Normal  appearance.  ?Cardiovascular:  ?   Rate and Rhythm: Normal rate and regular rhythm.  ?   Pulses: Normal pulses.  ?   Heart sounds: Normal heart sounds.  ?Pulmonary:  ?   Effort: Pulmonary effort is normal.  ?   Breath sounds: Normal breath sounds.  ?Abdominal:  ?   General: Abdomen is flat. Bowel sounds are normal. There is no distension.  ?   Palpations: There is no mass.  ?Musculoskeletal:     ?   General: No swelling.  ?   Cervical back: Normal range of motion and neck supple. No rigidity.  ?Lymphadenopathy:  ?   Cervical: No cervical adenopathy.  ?Skin: ?   General: Skin is warm and dry.  ?Neurological:  ?  Mental Status: He is alert.  ? ? ? ?LABORATORY DATA:  ?I have reviewed the data as listed ?Lab Results  ?Component Value Date  ? WBC 3.3 (L) 04/30/2021  ? HGB 10.0 (L) 04/30/2021  ? HCT 32.3 (L) 04/30/2021  ? MCV 79.0 (L) 04/30/2021  ? PLT 272 04/30/2021  ? ?  Chemistry   ?   ?Component Value Date/Time  ? NA 134 (L) 03/26/2021 1009  ? NA 134 06/19/2020 1118  ? K 4.6 03/26/2021 1009  ? CL 98 03/26/2021 1009  ? CO2 25 03/26/2021 1009  ? BUN 17 03/26/2021 1009  ? BUN 12 06/19/2020 1118  ? CREATININE 1.06 03/26/2021 1009  ? CREATININE 1.05 01/07/2016 0953  ? GLU 113 09/01/2016 0000  ?    ?Component Value Date/Time  ? CALCIUM 9.8 03/26/2021 1009  ? ALKPHOS 62 03/26/2021 1009  ? AST 146 (H) 03/26/2021 1009  ? ALT 167 (H) 03/26/2021 1009  ? BILITOT 0.4 03/26/2021 1009  ? BILITOT 0.7 06/19/2020 1118  ?  ? ? ?RADIOGRAPHIC STUDIES: ?I have personally reviewed the radiological images as listed and agreed with the findings in the report. ?MR Abdomen W Wo Contrast ? ?Result Date: 04/20/2021 ?CLINICAL DATA:  Liver lesion EXAM: MRI ABDOMEN WITHOUT AND WITH CONTRAST TECHNIQUE: Multiplanar multisequence MR imaging of the abdomen was performed both before and after the administration of intravenous contrast. CONTRAST:  55m MULTIHANCE GADOBENATE DIMEGLUMINE 529 MG/ML IV SOLN COMPARISON:  None. FINDINGS: Lower chest: No acute  findings. Hepatobiliary: No mass or other parenchymal abnormality identified. Numerous fluid signal lesions throughout the liver of varying sizes, without associated solid component or contrast enhancement. No g

## 2021-05-05 ENCOUNTER — Telehealth: Payer: Self-pay

## 2021-05-05 NOTE — Telephone Encounter (Signed)
-----   Message from Benay Pike, MD sent at 05/03/2021  9:57 AM EDT ----- ?Ferritin at 9, no blood donation needed, will repeat labs in 3/4 months. ? ?Thanks ?

## 2021-05-05 NOTE — Telephone Encounter (Signed)
Called pt, left VM reviewing lab results and requested return phone call.  Per Dr Chryl Heck, ferritin is 9 - plan is to repeat labs in 3/4 months, no blood donation needed at this time.  ?

## 2021-05-09 ENCOUNTER — Other Ambulatory Visit: Payer: Self-pay | Admitting: Physician Assistant

## 2021-05-09 ENCOUNTER — Other Ambulatory Visit: Payer: Self-pay | Admitting: Internal Medicine

## 2021-05-09 ENCOUNTER — Encounter: Payer: Self-pay | Admitting: Internal Medicine

## 2021-05-09 DIAGNOSIS — E781 Pure hyperglyceridemia: Secondary | ICD-10-CM

## 2021-05-09 DIAGNOSIS — E782 Mixed hyperlipidemia: Secondary | ICD-10-CM

## 2021-05-09 DIAGNOSIS — E785 Hyperlipidemia, unspecified: Secondary | ICD-10-CM

## 2021-05-09 MED ORDER — EZETIMIBE 10 MG PO TABS: 10.0000 mg | ORAL_TABLET | Freq: Every day | ORAL | 1 refills | Status: DC

## 2021-05-29 DIAGNOSIS — K769 Liver disease, unspecified: Secondary | ICD-10-CM | POA: Diagnosis not present

## 2021-05-29 DIAGNOSIS — R748 Abnormal levels of other serum enzymes: Secondary | ICD-10-CM | POA: Diagnosis not present

## 2021-05-29 DIAGNOSIS — D649 Anemia, unspecified: Secondary | ICD-10-CM | POA: Diagnosis not present

## 2021-05-29 DIAGNOSIS — K5900 Constipation, unspecified: Secondary | ICD-10-CM | POA: Diagnosis not present

## 2021-06-25 ENCOUNTER — Inpatient Hospital Stay: Payer: BC Managed Care – PPO

## 2021-06-25 ENCOUNTER — Other Ambulatory Visit: Payer: Self-pay | Admitting: Internal Medicine

## 2021-06-25 DIAGNOSIS — I10 Essential (primary) hypertension: Secondary | ICD-10-CM

## 2021-07-01 ENCOUNTER — Telehealth: Payer: Self-pay | Admitting: Hematology and Oncology

## 2021-07-01 NOTE — Telephone Encounter (Signed)
Rescheduled appointment per providers template. Left message.  ? ?

## 2021-08-27 ENCOUNTER — Ambulatory Visit: Payer: BC Managed Care – PPO | Admitting: Hematology and Oncology

## 2021-08-28 ENCOUNTER — Encounter: Payer: Self-pay | Admitting: Hematology and Oncology

## 2021-08-28 ENCOUNTER — Other Ambulatory Visit: Payer: Self-pay

## 2021-08-28 ENCOUNTER — Inpatient Hospital Stay: Payer: BC Managed Care – PPO

## 2021-08-28 ENCOUNTER — Inpatient Hospital Stay: Payer: BC Managed Care – PPO | Attending: Hematology and Oncology | Admitting: Hematology and Oncology

## 2021-08-28 DIAGNOSIS — Z8614 Personal history of Methicillin resistant Staphylococcus aureus infection: Secondary | ICD-10-CM | POA: Diagnosis not present

## 2021-08-28 DIAGNOSIS — I10 Essential (primary) hypertension: Secondary | ICD-10-CM | POA: Insufficient documentation

## 2021-08-28 DIAGNOSIS — Z79899 Other long term (current) drug therapy: Secondary | ICD-10-CM | POA: Insufficient documentation

## 2021-08-28 LAB — CBC WITH DIFFERENTIAL/PLATELET
Abs Immature Granulocytes: 0.01 10*3/uL (ref 0.00–0.07)
Basophils Absolute: 0.1 10*3/uL (ref 0.0–0.1)
Basophils Relative: 2 %
Eosinophils Absolute: 0.1 10*3/uL (ref 0.0–0.5)
Eosinophils Relative: 2 %
HCT: 41.2 % (ref 39.0–52.0)
Hemoglobin: 14.4 g/dL (ref 13.0–17.0)
Immature Granulocytes: 0 %
Lymphocytes Relative: 32 %
Lymphs Abs: 1.2 10*3/uL (ref 0.7–4.0)
MCH: 30.2 pg (ref 26.0–34.0)
MCHC: 35 g/dL (ref 30.0–36.0)
MCV: 86.4 fL (ref 80.0–100.0)
Monocytes Absolute: 0.5 10*3/uL (ref 0.1–1.0)
Monocytes Relative: 14 %
Neutro Abs: 1.9 10*3/uL (ref 1.7–7.7)
Neutrophils Relative %: 50 %
Platelets: 190 10*3/uL (ref 150–400)
RBC: 4.77 MIL/uL (ref 4.22–5.81)
RDW: 14.9 % (ref 11.5–15.5)
WBC: 3.8 10*3/uL — ABNORMAL LOW (ref 4.0–10.5)
nRBC: 0 % (ref 0.0–0.2)

## 2021-08-28 LAB — COMPREHENSIVE METABOLIC PANEL
ALT: 68 U/L — ABNORMAL HIGH (ref 0–44)
AST: 97 U/L — ABNORMAL HIGH (ref 15–41)
Albumin: 4.5 g/dL (ref 3.5–5.0)
Alkaline Phosphatase: 72 U/L (ref 38–126)
Anion gap: 8 (ref 5–15)
BUN: 13 mg/dL (ref 6–20)
CO2: 22 mmol/L (ref 22–32)
Calcium: 9 mg/dL (ref 8.9–10.3)
Chloride: 103 mmol/L (ref 98–111)
Creatinine, Ser: 1.08 mg/dL (ref 0.61–1.24)
GFR, Estimated: >60 mL/min (ref >60)
Glucose, Bld: 135 mg/dL — ABNORMAL HIGH (ref 70–99)
Potassium: 4.2 mmol/L (ref 3.5–5.1)
Sodium: 133 mmol/L — ABNORMAL LOW (ref 135–145)
Total Bilirubin: 1.1 mg/dL (ref 0.3–1.2)
Total Protein: 7.3 g/dL (ref 6.5–8.1)

## 2021-08-28 LAB — FERRITIN: Ferritin: 65 ng/mL (ref 24–336)

## 2021-08-28 NOTE — Progress Notes (Signed)
[place] NOTE  Patient Care Team: Janith Lima, MD as PCP - General (Internal Medicine) Wilford Corner, MD as Consulting Physician (Gastroenterology) Warren Danes, PA-C as Physician Assistant (Dermatology) Carolan Clines, MD (Inactive) as Consulting Physician (Urology) [name], MD as Consulting Physician (Infectious Diseases)  CHIEF COMPLAINTS/PURPOSE OF CONSULTATION:  Iron overload disorder  ASSESSMENT & PLAN:    This is a very pleasant 61 year old male patient with iron overload disorder referred to hematology for additional recommendations regarding possible hemochromatosis and treatment.  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 Last phlebotomy was March 2023. No concerns on ROS or PE findings. Repeat labs today. [facility]  Given his family history of ovarian cancer in mom and colon cancer in dad, also discussed about a genetics referral , genetic testing negative, no clinically significant variants detected.  We have previously and once again discussed to avoid alcohol, other hepatotoxins including cooking and iron pots and pans as well as regular Tylenol use.  He expressed understanding of these recommendations.  Age-appropriate cancer screening advised, up to date.  Thank you for consulting [place] in the care of this patient.  Please do not hesitate to contact us with any additional questions or concerns.   HISTORY OF PRESENTING ILLNESS:   Keith Bullock 61 y.o. male is here because of Iron overload disorder.  This is a very pleasant 61 year old male patient with hypertension referred to hematology for evaluation of iron overload disorder/hemochromatosis.  He is here for follow up. He feels some joint pains in old injuries when its time for phlebotomy and he started noticing right hip pain Last blood donation in March 2023. No other complaints.  Rest of the  pertinent 10 point ROS reviewed and negative.  MEDICAL HISTORY:  Past Medical History:  Diagnosis Date   Family history of colon cancer 10/18/2020   Family history of ovarian cancer 10/18/2020   Hypertension    MRSA cellulitis     SURGICAL HISTORY: Past Surgical History:  Procedure Laterality Date   INSERTION OF MESH N/A 02/23/2017   Procedure: INSERTION OF MESH;  Surgeon: Ralene Ok, MD;  Location: [place];  Service: General;  Laterality: N/A;   ROTATOR CUFF REPAIR Left    UMBILICAL HERNIA REPAIR N/A 02/23/2017   Procedure: LAPAROSCOPIC UMBILICAL HERNIA REPAIR WITH MESH;  Surgeon: Ralene Ok, MD;  Location: [place];  Service: General;  Laterality: N/A;    SOCIAL HISTORY: Social History   Socioeconomic History   Marital status: Married    Spouse name: Not on file   Number of children: Not on file   Years of education: Not on file   Highest education level: Not on file  Occupational History   Not on file  Tobacco Use   Smoking status: Never   Smokeless tobacco: Never  Vaping Use   Vaping Use: Never used  Substance and Sexual Activity   Alcohol use: Not Currently    Comment: quit 10 days ago   Drug use: No   Sexual activity: Yes    Partners: Female  Other Topics Concern   Not on file  Social History Narrative   Not on file   Social Determinants of Health   Financial Resource Strain: Not on file  Food Insecurity: Not on file  Transportation Needs: Not on file  Physical Activity: Not on file  Stress: Not on file  Social Connections: Not on file  Intimate Partner  Violence: Not on file    FAMILY HISTORY: Family History  Problem Relation Age of Onset   Ovarian cancer Mother 72   Depression Mother    Hypertension Mother    Colon cancer Father 47   Stomach cancer Paternal Aunt        d. 50   Diabetes Paternal Grandfather     ALLERGIES:  is allergic to flomax [tamsulosin hcl].  MEDICATIONS:  Current Outpatient Medications  Medication Sig Dispense  Refill   Biotin 5000 MCG TABS Take 1 tablet by mouth daily.     cetirizine (ZYRTEC) 10 MG tablet Take 10 mg by mouth daily as needed for allergies.     Cyanocobalamin (B-12) 500 MCG TABS Take 1 tablet by mouth daily.     ezetimibe (ZETIA) 10 MG tablet Take 1 tablet (10 mg total) by mouth daily. TAKE 1 TABLET BY MOUTH AT BEDTIME. 90 tablet 1   Famotidine-Ca Carb-Mag Hydrox (PEPCID COMPLETE PO) Take 1 tablet by mouth daily as needed (acid reflux).     gabapentin (NEURONTIN) 100 MG capsule TAKE 2 CAPSULES BY MOUTH AT BEDTIME. 180 capsule 0   hydrOXYzine (ATARAX/VISTARIL) 10 MG tablet Take 1 tablet (10 mg total) by mouth 3 (three) times daily as needed. 270 tablet 0   Multiple Vitamin (MULTIVITAMIN WITH MINERALS) TABS tablet Take 1 tablet by mouth daily.     omega-3 acid ethyl esters (LOVAZA) 1 g capsule Take 2 capsules (2 g total) by mouth 2 (two) times daily. 360 capsule 0   traMADol (ULTRAM) 50 MG tablet Take 1 tablet (50 mg total) by mouth every 6 (six) hours as needed. 30 tablet 0   valsartan (DIOVAN) 320 MG tablet TAKE 1 TABLET (320 MG TOTAL) BY MOUTH DAILY. 90 tablet 0   No current facility-administered medications for this visit.    PHYSICAL EXAMINATION: ECOG PERFORMANCE STATUS: 1 - Symptomatic but completely ambulatory  Vitals:   08/28/21 0840  BP: (!) 125/91  Pulse: 95  Temp: 97.9 F (36.6 C)  SpO2: 96%    Filed Weights   08/28/21 0840  Weight: 197 lb 12.8 oz (89.7 kg)   Physical Exam Constitutional:      Appearance: Normal appearance.  Cardiovascular:     Rate and Rhythm: Normal rate and regular rhythm.     Pulses: Normal pulses.     Heart sounds: Normal heart sounds.  Pulmonary:     Effort: Pulmonary effort is normal.     Breath sounds: Normal breath sounds.  Abdominal:     General: Abdomen is flat. Bowel sounds are normal. There is no distension.     Palpations: There is no mass.  Musculoskeletal:        General: No swelling.     Cervical back: Normal range of  motion and neck supple. No rigidity.  Lymphadenopathy:     Cervical: No cervical adenopathy.  Skin:    General: Skin is warm and dry.  Neurological:     Mental Status: He is alert.      LABORATORY DATA:  I have reviewed the data as listed Lab Results  Component Value Date   WBC 3.3 (L) 04/30/2021   HGB 10.0 (L) 04/30/2021   HCT 32.3 (L) 04/30/2021   MCV 79.0 (L) 04/30/2021   PLT 272 04/30/2021     Chemistry      Component Value Date/Time   NA 134 (L) 03/26/2021 1009   NA 134 06/19/2020 1118   K 4.6 03/26/2021 1009   CL  98 03/26/2021 1009   CO2 25 03/26/2021 1009   BUN 17 03/26/2021 1009   BUN 12 06/19/2020 1118   CREATININE 1.06 03/26/2021 1009   CREATININE 1.05 01/07/2016 0953   GLU 113 09/01/2016 0000      Component Value Date/Time   CALCIUM 9.8 03/26/2021 1009   ALKPHOS 62 03/26/2021 1009   AST 146 (H) 03/26/2021 1009   ALT 167 (H) 03/26/2021 1009   BILITOT 0.4 03/26/2021 1009   BILITOT 0.7 06/19/2020 1118      RADIOGRAPHIC STUDIES: I have personally reviewed the radiological images as listed and agreed with the findings in the report. No results found.  All questions were answered. The patient knows to call the clinic with any problems, questions or concerns. I spent 20 minutes in the care of this patient including history, review of records, counseling and coordination of care.    Benay Pike, MD 08/28/2021 8:42 AM

## 2021-09-03 ENCOUNTER — Telehealth: Payer: Self-pay | Admitting: *Deleted

## 2021-09-03 NOTE — Telephone Encounter (Addendum)
-----   Message from Benay Pike, MD sent at 08/28/2021  5:37 PM EDT ----- Can you give him a call and let him know that he is okay to donate blood at [facility] once.  He usually likes to donate rather than phlebotomy.  He can return to clinic in 3 to 4 months and we will repeat labs and see if he needs to donate it again.  This RN left detailed message per above on pt's VM. This RN's name given for return call if needed.

## 2021-09-17 DIAGNOSIS — Z800 Family history of malignant neoplasm of digestive organs: Secondary | ICD-10-CM | POA: Diagnosis not present

## 2021-09-17 DIAGNOSIS — D125 Benign neoplasm of sigmoid colon: Secondary | ICD-10-CM | POA: Diagnosis not present

## 2021-09-17 DIAGNOSIS — K648 Other hemorrhoids: Secondary | ICD-10-CM | POA: Diagnosis not present

## 2021-09-17 DIAGNOSIS — Z1211 Encounter for screening for malignant neoplasm of colon: Secondary | ICD-10-CM | POA: Diagnosis not present

## 2021-09-17 DIAGNOSIS — K5730 Diverticulosis of large intestine without perforation or abscess without bleeding: Secondary | ICD-10-CM | POA: Diagnosis not present

## 2021-09-17 LAB — HM COLONOSCOPY

## 2021-10-20 ENCOUNTER — Other Ambulatory Visit: Payer: Self-pay | Admitting: Physician Assistant

## 2021-10-20 DIAGNOSIS — I10 Essential (primary) hypertension: Secondary | ICD-10-CM

## 2021-10-22 ENCOUNTER — Other Ambulatory Visit: Payer: Self-pay | Admitting: Internal Medicine

## 2021-10-22 DIAGNOSIS — I10 Essential (primary) hypertension: Secondary | ICD-10-CM

## 2021-10-30 ENCOUNTER — Other Ambulatory Visit: Payer: Self-pay | Admitting: Internal Medicine

## 2021-10-30 DIAGNOSIS — I10 Essential (primary) hypertension: Secondary | ICD-10-CM

## 2021-10-31 MED ORDER — VALSARTAN 320 MG PO TABS: 320.0000 mg | ORAL_TABLET | Freq: Every day | ORAL | 0 refills | Status: DC

## 2021-11-04 ENCOUNTER — Other Ambulatory Visit: Payer: Self-pay | Admitting: Internal Medicine

## 2021-11-04 DIAGNOSIS — E785 Hyperlipidemia, unspecified: Secondary | ICD-10-CM

## 2021-12-10 ENCOUNTER — Ambulatory Visit: Payer: BC Managed Care – PPO | Admitting: Internal Medicine

## 2022-01-01 ENCOUNTER — Other Ambulatory Visit: Payer: Self-pay

## 2022-01-01 ENCOUNTER — Inpatient Hospital Stay: Payer: BC Managed Care – PPO | Attending: Hematology and Oncology | Admitting: Hematology and Oncology

## 2022-01-01 ENCOUNTER — Encounter: Payer: Self-pay | Admitting: Hematology and Oncology

## 2022-01-01 ENCOUNTER — Inpatient Hospital Stay: Payer: BC Managed Care – PPO

## 2022-01-01 DIAGNOSIS — Z833 Family history of diabetes mellitus: Secondary | ICD-10-CM | POA: Insufficient documentation

## 2022-01-01 DIAGNOSIS — Z8041 Family history of malignant neoplasm of ovary: Secondary | ICD-10-CM | POA: Diagnosis not present

## 2022-01-01 DIAGNOSIS — M2550 Pain in unspecified joint: Secondary | ICD-10-CM | POA: Insufficient documentation

## 2022-01-01 DIAGNOSIS — Z818 Family history of other mental and behavioral disorders: Secondary | ICD-10-CM | POA: Diagnosis not present

## 2022-01-01 DIAGNOSIS — Z8249 Family history of ischemic heart disease and other diseases of the circulatory system: Secondary | ICD-10-CM | POA: Insufficient documentation

## 2022-01-01 DIAGNOSIS — R935 Abnormal findings on diagnostic imaging of other abdominal regions, including retroperitoneum: Secondary | ICD-10-CM

## 2022-01-01 DIAGNOSIS — Z800 Family history of malignant neoplasm of digestive organs: Secondary | ICD-10-CM | POA: Insufficient documentation

## 2022-01-01 DIAGNOSIS — Z8614 Personal history of Methicillin resistant Staphylococcus aureus infection: Secondary | ICD-10-CM | POA: Insufficient documentation

## 2022-01-01 DIAGNOSIS — Z79899 Other long term (current) drug therapy: Secondary | ICD-10-CM | POA: Insufficient documentation

## 2022-01-01 DIAGNOSIS — R634 Abnormal weight loss: Secondary | ICD-10-CM | POA: Insufficient documentation

## 2022-01-01 DIAGNOSIS — R1900 Intra-abdominal and pelvic swelling, mass and lump, unspecified site: Secondary | ICD-10-CM

## 2022-01-01 DIAGNOSIS — I10 Essential (primary) hypertension: Secondary | ICD-10-CM | POA: Diagnosis not present

## 2022-01-01 DIAGNOSIS — K7689 Other specified diseases of liver: Secondary | ICD-10-CM

## 2022-01-01 DIAGNOSIS — K8689 Other specified diseases of pancreas: Secondary | ICD-10-CM

## 2022-01-01 LAB — COMPREHENSIVE METABOLIC PANEL
ALT: 62 U/L — ABNORMAL HIGH (ref 0–44)
AST: 47 U/L — ABNORMAL HIGH (ref 15–41)
Albumin: 3.9 g/dL (ref 3.5–5.0)
Alkaline Phosphatase: 73 U/L (ref 38–126)
Anion gap: 5 (ref 5–15)
BUN: 12 mg/dL (ref 8–23)
CO2: 26 mmol/L (ref 22–32)
Calcium: 9.5 mg/dL (ref 8.9–10.3)
Chloride: 105 mmol/L (ref 98–111)
Creatinine, Ser: 1.05 mg/dL (ref 0.61–1.24)
GFR, Estimated: >60 mL/min (ref >60)
Glucose, Bld: 151 mg/dL — ABNORMAL HIGH (ref 70–99)
Potassium: 3.9 mmol/L (ref 3.5–5.1)
Sodium: 136 mmol/L (ref 135–145)
Total Bilirubin: 0.5 mg/dL (ref 0.3–1.2)
Total Protein: 6.1 g/dL — ABNORMAL LOW (ref 6.5–8.1)

## 2022-01-01 LAB — CBC WITH DIFFERENTIAL/PLATELET
Abs Immature Granulocytes: 0 10*3/uL (ref 0.00–0.07)
Basophils Absolute: 0.1 10*3/uL (ref 0.0–0.1)
Basophils Relative: 2 %
Eosinophils Absolute: 0.1 10*3/uL (ref 0.0–0.5)
Eosinophils Relative: 3 %
HCT: 35.2 % — ABNORMAL LOW (ref 39.0–52.0)
Hemoglobin: 12.2 g/dL — ABNORMAL LOW (ref 13.0–17.0)
Immature Granulocytes: 0 %
Lymphocytes Relative: 35 %
Lymphs Abs: 1.1 10*3/uL (ref 0.7–4.0)
MCH: 32.1 pg (ref 26.0–34.0)
MCHC: 34.7 g/dL (ref 30.0–36.0)
MCV: 92.6 fL (ref 80.0–100.0)
Monocytes Absolute: 0.4 10*3/uL (ref 0.1–1.0)
Monocytes Relative: 12 %
Neutro Abs: 1.6 10*3/uL — ABNORMAL LOW (ref 1.7–7.7)
Neutrophils Relative %: 48 %
Platelets: 244 10*3/uL (ref 150–400)
RBC: 3.8 MIL/uL — ABNORMAL LOW (ref 4.22–5.81)
RDW: 12.4 % (ref 11.5–15.5)
WBC: 3.2 10*3/uL — ABNORMAL LOW (ref 4.0–10.5)
nRBC: 0 % (ref 0.0–0.2)

## 2022-01-01 NOTE — Progress Notes (Signed)
[place] NOTE  Patient Care Team: Janith Lima, MD as PCP - General (Internal Medicine) Wilford Corner, MD as Consulting Physician (Gastroenterology) Warren Danes, PA-C as Physician Assistant (Dermatology) Carolan Clines, MD (Inactive) as Consulting Physician (Urology) [name], MD as Consulting Physician (Infectious Diseases)  CHIEF COMPLAINTS/PURPOSE OF CONSULTATION:  Iron overload disorder  ASSESSMENT & PLAN:    This is a very pleasant 61 year old male patient with iron overload disorder referred to hematology for additional recommendations regarding possible hemochromatosis and treatment.  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 Since his last visit here, he has been donating blood on a regular basis.  Last donation was on November 24.  Prior to that he donated in August  2023.  He feels great after donating.  His joint pains are better.  He has more energy, eats well, has been exercising every day.  Overall he denies any change in health.  No new medications.  Physical examination today with no findings that are unremarkable.  We have encouraged to blood donation every 3 to 4 months.  Will repeat labs CBC, CMP, iron panel and ferritin today  Given his family history of ovarian cancer in mom and colon cancer in dad, also discussed about a genetics referral , genetic testing negative, no clinically significant variants detected.  We have previously and once again discussed to avoid alcohol, other hepatotoxins including cooking and iron pots and pans as well as regular Tylenol use.  He expressed understanding of these recommendations.  Age-appropriate cancer screening advised, up to date.  Thank you for consulting [place] in the care of this patient.  Please do not hesitate to contact us with any additional questions or concerns.   HISTORY OF PRESENTING ILLNESS:   Keith Bullock 61 y.o. male is here because  of Iron overload disorder.  This is a very pleasant 61 year old male patient with hypertension referred to hematology for evaluation of iron overload disorder/hemochromatosis.  He is here for follow up. Since his last visit, he has been routinely donating blood at blood clots every 3 to 4 months and he feels wonderful since he has been donating on a regular basis.  He has more energy, less joint pains.  He has been eating healthy, losing weight intentionally.  He is excited about Christmas, has all the 7 grandsons coming.  Rest of the pertinent 10 point ROS reviewed and negative.  MEDICAL HISTORY:  Past Medical History:  Diagnosis Date   Family history of colon cancer 10/18/2020   Family history of ovarian cancer 10/18/2020   Hypertension    MRSA cellulitis     SURGICAL HISTORY: Past Surgical History:  Procedure Laterality Date   INSERTION OF MESH N/A 02/23/2017   Procedure: INSERTION OF MESH;  Surgeon: Ralene Ok, MD;  Location: [place];  Service: General;  Laterality: N/A;   ROTATOR CUFF REPAIR Left    UMBILICAL HERNIA REPAIR N/A 02/23/2017   Procedure: LAPAROSCOPIC UMBILICAL HERNIA REPAIR WITH MESH;  Surgeon: Ralene Ok, MD;  Location: [place];  Service: General;  Laterality: N/A;    SOCIAL HISTORY: Social History   Socioeconomic History   Marital status: Married    Spouse name: Not on file   Number of children: Not on file   Years of education: Not on file   Highest education level: Not on file  Occupational History   Not on file  Tobacco Use   Smoking status: Never   Smokeless tobacco:  Never  Vaping Use   Vaping Use: Never used  Substance and Sexual Activity   Alcohol use: Not Currently    Comment: quit 10 days ago   Drug use: No   Sexual activity: Yes    Partners: Female  Other Topics Concern   Not on file  Social History Narrative   Not on file   Social Determinants of Health   Financial Resource Strain: Not on file  Food Insecurity: Not on file   Transportation Needs: Not on file  Physical Activity: Not on file  Stress: Not on file  Social Connections: Not on file  Intimate Partner Violence: Not on file    FAMILY HISTORY: Family History  Problem Relation Age of Onset   Ovarian cancer Mother 61   Depression Mother    Hypertension Mother    Colon cancer Father 55   Stomach cancer Paternal Aunt        d. 63   Diabetes Paternal Grandfather     ALLERGIES:  is allergic to flomax [tamsulosin hcl].  MEDICATIONS:  Current Outpatient Medications  Medication Sig Dispense Refill   Biotin 5000 MCG TABS Take 1 tablet by mouth daily.     cetirizine (ZYRTEC) 10 MG tablet Take 10 mg by mouth daily as needed for allergies.     Cyanocobalamin (B-12) 500 MCG TABS Take 1 tablet by mouth daily.     ezetimibe (ZETIA) 10 MG tablet TAKE 1 TABLET (10 MG TOTAL) BY MOUTH DAILY AT BEDTIME 90 tablet 1   Famotidine-Ca Carb-Mag Hydrox (PEPCID COMPLETE PO) Take 1 tablet by mouth daily as needed (acid reflux).     Multiple Vitamin (MULTIVITAMIN WITH MINERALS) TABS tablet Take 1 tablet by mouth daily.     omega-3 acid ethyl esters (LOVAZA) 1 g capsule Take 2 capsules (2 g total) by mouth 2 (two) times daily. 360 capsule 0   traMADol (ULTRAM) 50 MG tablet Take 1 tablet (50 mg total) by mouth every 6 (six) hours as needed. 30 tablet 0   valsartan (DIOVAN) 320 MG tablet Take 1 tablet (320 mg total) by mouth daily. 90 tablet 0   No current facility-administered medications for this visit.    PHYSICAL EXAMINATION: ECOG PERFORMANCE STATUS: 1 - Symptomatic but completely ambulatory  There were no vitals filed for this visit.   There were no vitals filed for this visit.  Physical Exam Constitutional:      Appearance: Normal appearance.  Cardiovascular:     Rate and Rhythm: Normal rate and regular rhythm.     Pulses: Normal pulses.     Heart sounds: Normal heart sounds.  Pulmonary:     Effort: Pulmonary effort is normal.     Breath sounds: Normal  breath sounds.  Abdominal:     General: Abdomen is flat. Bowel sounds are normal. There is no distension.     Palpations: There is no mass.  Musculoskeletal:        General: No swelling.     Cervical back: Normal range of motion and neck supple. No rigidity.  Lymphadenopathy:     Cervical: No cervical adenopathy.  Skin:    General: Skin is warm and dry.  Neurological:     Mental Status: He is alert.      LABORATORY DATA:  I have reviewed the data as listed Lab Results  Component Value Date   WBC 3.8 (L) 08/28/2021   HGB 14.4 08/28/2021   HCT 41.2 08/28/2021   MCV 86.4 08/28/2021  PLT 190 08/28/2021     Chemistry      Component Value Date/Time   NA 133 (L) 08/28/2021 0904   NA 134 06/19/2020 1118   K 4.2 08/28/2021 0904   CL 103 08/28/2021 0904   CO2 22 08/28/2021 0904   BUN 13 08/28/2021 0904   BUN 12 06/19/2020 1118   CREATININE 1.08 08/28/2021 0904   CREATININE 1.05 01/07/2016 0953   GLU 113 09/01/2016 0000      Component Value Date/Time   CALCIUM 9.0 08/28/2021 0904   ALKPHOS 72 08/28/2021 0904   AST 97 (H) 08/28/2021 0904   ALT 68 (H) 08/28/2021 0904   BILITOT 1.1 08/28/2021 0904   BILITOT 0.7 06/19/2020 1118      RADIOGRAPHIC STUDIES: I have personally reviewed the radiological images as listed and agreed with the findings in the report. No results found.  All questions were answered. The patient knows to call the clinic with any problems, questions or concerns. I spent 20 minutes in the care of this patient including history, review of records, counseling and coordination of care.    Benay Pike, MD 01/01/2022 8:51 AM

## 2022-03-20 ENCOUNTER — Other Ambulatory Visit: Payer: Self-pay | Admitting: Internal Medicine

## 2022-03-20 DIAGNOSIS — K8689 Other specified diseases of pancreas: Secondary | ICD-10-CM | POA: Insufficient documentation

## 2022-03-20 DIAGNOSIS — I10 Essential (primary) hypertension: Secondary | ICD-10-CM

## 2022-03-20 DIAGNOSIS — R935 Abnormal findings on diagnostic imaging of other abdominal regions, including retroperitoneum: Secondary | ICD-10-CM | POA: Insufficient documentation

## 2022-03-20 NOTE — Addendum Note (Signed)
Addended by: Janith Lima on: 03/20/2022 02:44 PM   Modules accepted: Orders

## 2022-03-23 ENCOUNTER — Other Ambulatory Visit: Payer: Self-pay | Admitting: Internal Medicine

## 2022-03-23 ENCOUNTER — Encounter: Payer: Self-pay | Admitting: Internal Medicine

## 2022-03-23 DIAGNOSIS — I10 Essential (primary) hypertension: Secondary | ICD-10-CM

## 2022-03-23 MED ORDER — VALSARTAN 320 MG PO TABS: 320.0000 mg | ORAL_TABLET | Freq: Every day | ORAL | 0 refills | Status: DC

## 2022-03-31 ENCOUNTER — Encounter: Payer: Self-pay | Admitting: Internal Medicine

## 2022-04-11 ENCOUNTER — Ambulatory Visit
Admission: RE | Admit: 2022-04-11 | Discharge: 2022-04-11 | Disposition: A | Discharge status: Home / Self Care | Payer: BC Managed Care – PPO | Source: Ambulatory Visit | Attending: Internal Medicine | Admitting: Internal Medicine

## 2022-04-11 DIAGNOSIS — K862 Cyst of pancreas: Secondary | ICD-10-CM | POA: Diagnosis not present

## 2022-04-11 DIAGNOSIS — R1900 Intra-abdominal and pelvic swelling, mass and lump, unspecified site: Secondary | ICD-10-CM

## 2022-04-11 DIAGNOSIS — R935 Abnormal findings on diagnostic imaging of other abdominal regions, including retroperitoneum: Secondary | ICD-10-CM

## 2022-04-11 DIAGNOSIS — K7689 Other specified diseases of liver: Secondary | ICD-10-CM

## 2022-04-11 DIAGNOSIS — K8689 Other specified diseases of pancreas: Secondary | ICD-10-CM

## 2022-04-11 MED ORDER — GADOPICLENOL 0.5 MMOL/ML IV SOLN
9.0000 mL | Freq: Once | INTRAVENOUS | Status: AC | PRN
  Administered 2022-04-11: 9 mL via INTRAVENOUS

## 2022-04-20 ENCOUNTER — Ambulatory Visit (INDEPENDENT_AMBULATORY_CARE_PROVIDER_SITE_OTHER): Payer: BC Managed Care – PPO | Admitting: Internal Medicine

## 2022-04-20 ENCOUNTER — Encounter: Payer: Self-pay | Admitting: Internal Medicine

## 2022-04-20 VITALS — BP 142/88 | HR 74 | Temp 98.1°F | Ht 70.0 in | Wt 199.0 lb

## 2022-04-20 DIAGNOSIS — E781 Pure hyperglyceridemia: Secondary | ICD-10-CM

## 2022-04-20 DIAGNOSIS — Z7901 Long term (current) use of anticoagulants: Secondary | ICD-10-CM | POA: Diagnosis not present

## 2022-04-20 DIAGNOSIS — I10 Essential (primary) hypertension: Secondary | ICD-10-CM

## 2022-04-20 DIAGNOSIS — D539 Nutritional anemia, unspecified: Secondary | ICD-10-CM

## 2022-04-20 DIAGNOSIS — R739 Hyperglycemia, unspecified: Secondary | ICD-10-CM | POA: Diagnosis not present

## 2022-04-20 DIAGNOSIS — R9431 Abnormal electrocardiogram [ECG] [EKG]: Secondary | ICD-10-CM

## 2022-04-20 DIAGNOSIS — Z0001 Encounter for general adult medical examination with abnormal findings: Secondary | ICD-10-CM

## 2022-04-20 DIAGNOSIS — D500 Iron deficiency anemia secondary to blood loss (chronic): Secondary | ICD-10-CM

## 2022-04-20 DIAGNOSIS — R7989 Other specified abnormal findings of blood chemistry: Secondary | ICD-10-CM

## 2022-04-20 DIAGNOSIS — R5382 Chronic fatigue, unspecified: Secondary | ICD-10-CM | POA: Diagnosis not present

## 2022-04-20 DIAGNOSIS — H903 Sensorineural hearing loss, bilateral: Secondary | ICD-10-CM

## 2022-04-20 DIAGNOSIS — R0609 Other forms of dyspnea: Secondary | ICD-10-CM

## 2022-04-20 LAB — CBC WITH DIFFERENTIAL/PLATELET
Basophils Absolute: 0 10*3/uL (ref 0.0–0.1)
Basophils Relative: 1.2 % (ref 0.0–3.0)
Eosinophils Absolute: 0 10*3/uL (ref 0.0–0.7)
Eosinophils Relative: 1.1 % (ref 0.0–5.0)
HCT: 40.9 % (ref 39.0–52.0)
Hemoglobin: 13.9 g/dL (ref 13.0–17.0)
Lymphocytes Relative: 34 % (ref 12.0–46.0)
Lymphs Abs: 1.2 10*3/uL (ref 0.7–4.0)
MCHC: 33.9 g/dL (ref 30.0–36.0)
MCV: 89.2 fl (ref 78.0–100.0)
Monocytes Absolute: 0.4 10*3/uL (ref 0.1–1.0)
Monocytes Relative: 11.2 % (ref 3.0–12.0)
Neutro Abs: 1.8 10*3/uL (ref 1.4–7.7)
Neutrophils Relative %: 52.5 % (ref 43.0–77.0)
Platelets: 188 10*3/uL (ref 150.0–400.0)
RBC: 4.59 Mil/uL (ref 4.22–5.81)
RDW: 17 % — ABNORMAL HIGH (ref 11.5–15.5)
WBC: 3.5 10*3/uL — ABNORMAL LOW (ref 4.0–10.5)

## 2022-04-20 LAB — LIPID PANEL
Cholesterol: 132 mg/dL (ref 0–200)
HDL: 43.2 mg/dL (ref >39.00)
LDL Cholesterol: 68 mg/dL (ref 0–99)
NonHDL: 88.66
Total CHOL/HDL Ratio: 3
Triglycerides: 104 mg/dL (ref 0.0–149.0)
VLDL: 20.8 mg/dL (ref 0.0–40.0)

## 2022-04-20 LAB — BRAIN NATRIURETIC PEPTIDE: Pro B Natriuretic peptide (BNP): 14 pg/mL (ref 0.0–100.0)

## 2022-04-20 LAB — TROPONIN I (HIGH SENSITIVITY): High Sens Troponin I: 5 ng/L (ref 2–17)

## 2022-04-20 LAB — BASIC METABOLIC PANEL
BUN: 18 mg/dL (ref 6–23)
CO2: 24 mEq/L (ref 19–32)
Calcium: 9.3 mg/dL (ref 8.4–10.5)
Chloride: 103 mEq/L (ref 96–112)
Creatinine, Ser: 1.14 mg/dL (ref 0.40–1.50)
GFR: 69.36 mL/min (ref >60.00)
Glucose, Bld: 108 mg/dL — ABNORMAL HIGH (ref 70–99)
Potassium: 5.2 mEq/L — ABNORMAL HIGH (ref 3.5–5.1)
Sodium: 132 mEq/L — ABNORMAL LOW (ref 135–145)

## 2022-04-20 LAB — HEPATIC FUNCTION PANEL
ALT: 34 U/L (ref 0–53)
AST: 44 U/L — ABNORMAL HIGH (ref 0–37)
Albumin: 4.4 g/dL (ref 3.5–5.2)
Alkaline Phosphatase: 61 U/L (ref 39–117)
Bilirubin, Direct: 0.2 mg/dL (ref 0.0–0.3)
Total Bilirubin: 0.7 mg/dL (ref 0.2–1.2)
Total Protein: 6.9 g/dL (ref 6.0–8.3)

## 2022-04-20 LAB — IBC + FERRITIN
Ferritin: 34.2 ng/mL (ref 22.0–322.0)
Iron: 89 ug/dL (ref 42–165)
Saturation Ratios: 17.5 % — ABNORMAL LOW (ref 20.0–50.0)
TIBC: 509.6 ug/dL — ABNORMAL HIGH (ref 250.0–450.0)
Transferrin: 364 mg/dL — ABNORMAL HIGH (ref 212.0–360.0)

## 2022-04-20 LAB — PROTIME-INR
INR: 1.1 ratio — ABNORMAL HIGH (ref 0.8–1.0)
Prothrombin Time: 11.8 s (ref 9.6–13.1)

## 2022-04-20 LAB — PSA: PSA: 3.32 ng/mL (ref 0.10–4.00)

## 2022-04-20 LAB — HEMOGLOBIN A1C: Hgb A1c MFr Bld: 5.4 % (ref 4.6–6.5)

## 2022-04-20 LAB — VITAMIN B12: Vitamin B-12: 1120 pg/mL — ABNORMAL HIGH (ref 211–911)

## 2022-04-20 LAB — FOLATE: Folate: 11 ng/mL (ref >5.9)

## 2022-04-20 LAB — TSH: TSH: 2.75 u[IU]/mL (ref 0.35–5.50)

## 2022-04-20 MED ORDER — VALSARTAN 160 MG PO TABS: 160.0000 mg | ORAL_TABLET | Freq: Every day | ORAL | 0 refills | Status: DC

## 2022-04-20 NOTE — Patient Instructions (Signed)
Health Maintenance, Male Adopting a healthy lifestyle and getting preventive care are important in promoting health and wellness. Ask your health care provider about: The right schedule for you to have regular tests and exams. Things you can do on your own to prevent diseases and keep yourself healthy. What should I know about diet, weight, and exercise? Eat a healthy diet  Eat a diet that includes plenty of vegetables, fruits, low-fat dairy products, and lean protein. Do not eat a lot of foods that are high in solid fats, added sugars, or sodium. Maintain a healthy weight Body mass index (BMI) is a measurement that can be used to identify possible weight problems. It estimates body fat based on height and weight. Your health care provider can help determine your BMI and help you achieve or maintain a healthy weight. Get regular exercise Get regular exercise. This is one of the most important things you can do for your health. Most adults should: Exercise for at least 150 minutes each week. The exercise should increase your heart rate and make you sweat (moderate-intensity exercise). Do strengthening exercises at least twice a week. This is in addition to the moderate-intensity exercise. Spend less time sitting. Even light physical activity can be beneficial. Watch cholesterol and blood lipids Have your blood tested for lipids and cholesterol at 62 years of age, then have this test every 5 years. You may need to have your cholesterol levels checked more often if: Your lipid or cholesterol levels are high. You are older than 62 years of age. You are at high risk for heart disease. What should I know about cancer screening? Many types of cancers can be detected early and may often be prevented. Depending on your health history and family history, you may need to have cancer screening at various ages. This may include screening for: Colorectal cancer. Prostate cancer. Skin cancer. Lung  cancer. What should I know about heart disease, diabetes, and high blood pressure? Blood pressure and heart disease High blood pressure causes heart disease and increases the risk of stroke. This is more likely to develop in people who have high blood pressure readings or are overweight. Talk with your health care provider about your target blood pressure readings. Have your blood pressure checked: Every 3-5 years if you are 18-39 years of age. Every year if you are 40 years old or older. If you are between the ages of 65 and 75 and are a current or former smoker, ask your health care provider if you should have a one-time screening for abdominal aortic aneurysm (AAA). Diabetes Have regular diabetes screenings. This checks your fasting blood sugar level. Have the screening done: Once every three years after age 45 if you are at a normal weight and have a low risk for diabetes. More often and at a younger age if you are overweight or have a high risk for diabetes. What should I know about preventing infection? Hepatitis B If you have a higher risk for hepatitis B, you should be screened for this virus. Talk with your health care provider to find out if you are at risk for hepatitis B infection. Hepatitis C Blood testing is recommended for: Everyone born from 1945 through 1965. Anyone with known risk factors for hepatitis C. Sexually transmitted infections (STIs) You should be screened each year for STIs, including gonorrhea and chlamydia, if: You are sexually active and are younger than 62 years of age. You are older than 62 years of age and your   health care provider tells you that you are at risk for this type of infection. Your sexual activity has changed since you were last screened, and you are at increased risk for chlamydia or gonorrhea. Ask your health care provider if you are at risk. Ask your health care provider about whether you are at high risk for HIV. Your health care provider  may recommend a prescription medicine to help prevent HIV infection. If you choose to take medicine to prevent HIV, you should first get tested for HIV. You should then be tested every 3 months for as long as you are taking the medicine. Follow these instructions at home: Alcohol use Do not drink alcohol if your health care provider tells you not to drink. If you drink alcohol: Limit how much you have to 0-2 drinks a day. Know how much alcohol is in your drink. In the [place] one drink equals one 12 oz bottle of beer (355 mL), one 5 oz glass of wine (148 mL), or one 1 oz glass of hard liquor (44 mL). Lifestyle Do not use any products that contain nicotine or tobacco. These products include cigarettes, chewing tobacco, and vaping devices, such as e-cigarettes. If you need help quitting, ask your health care provider. Do not use street drugs. Do not share needles. Ask your health care provider for help if you need support or information about quitting drugs. General instructions Schedule regular health, dental, and eye exams. Stay current with your vaccines. Tell your health care provider if: You often feel depressed. You have ever been abused or do not feel safe at home. Summary Adopting a healthy lifestyle and getting preventive care are important in promoting health and wellness. Follow your health care provider's instructions about healthy diet, exercising, and getting tested or screened for diseases. Follow your health care provider's instructions on monitoring your cholesterol and blood pressure. This information is not intended to replace advice given to you by your health care provider. Make sure you discuss any questions you have with your health care provider. Document Revised: 05/27/2020 Document Reviewed: 05/27/2020 Elsevier Patient Education  2023 [employer]  

## 2022-04-20 NOTE — Progress Notes (Addendum)
Subjective:  Patient ID: Keith Bullock, male    DOB: [birth year]  Age: 62 y.o. MRN: [id number]  CC: Annual Exam, Hyperlipidemia, Hypertension, and Anemia   HPI Keith Bullock presents for a CPX and f/up ---  He has decreased his alcohol to about 2-3 servings per day.  He continues to complain of declining memory, forgetfulness, and decreased level of hearing.  He tells me he has a history of low T and complains of low libido and fatigue.  He is active and denies chest pain, diaphoresis, or edema.  He recently underwent an MRI of the abdomen with contrast and the results were reassuring that he does not have pancreatic or hepatic cancer.  Outpatient Medications Prior to Visit  Medication Sig Dispense Refill   cetirizine (ZYRTEC) 10 MG tablet Take 10 mg by mouth daily as needed for allergies.     ezetimibe (ZETIA) 10 MG tablet TAKE 1 TABLET (10 MG TOTAL) BY MOUTH DAILY AT BEDTIME 90 tablet 1   Famotidine-Ca Carb-Mag Hydrox (PEPCID COMPLETE PO) Take 1 tablet by mouth daily as needed (acid reflux).     Multiple Vitamin (MULTIVITAMIN WITH MINERALS) TABS tablet Take 1 tablet by mouth daily.     Biotin 5000 MCG TABS Take 1 tablet by mouth daily.     Cyanocobalamin (B-12) 500 MCG TABS Take 1 tablet by mouth daily.     omega-3 acid ethyl esters (LOVAZA) 1 g capsule Take 2 capsules (2 g total) by mouth 2 (two) times daily. 360 capsule 0   traMADol (ULTRAM) 50 MG tablet Take 1 tablet (50 mg total) by mouth every 6 (six) hours as needed. 30 tablet 0   valsartan (DIOVAN) 320 MG tablet Take 1 tablet (320 mg total) by mouth daily. 30 tablet 0   No facility-administered medications prior to visit.    ROS Review of Systems  Constitutional:  Positive for fatigue. Negative for appetite change, chills, diaphoresis, fever and unexpected weight change.  HENT:  Positive for hearing loss. Negative for trouble swallowing.   Eyes: Negative.  Negative for visual disturbance.  Respiratory:  Positive for  shortness of breath (DOE). Negative for cough, chest tightness and wheezing.   Cardiovascular:  Negative for chest pain, palpitations and leg swelling.  Gastrointestinal:  Negative for abdominal pain, constipation, diarrhea, nausea and vomiting.  Genitourinary:  Positive for difficulty urinating. Negative for dysuria.       + nocturia and weak urine stream  Musculoskeletal: Negative.  Negative for arthralgias and myalgias.  Skin: Negative.   Neurological: Negative.  Negative for dizziness, speech difficulty, weakness, light-headedness and headaches.  Hematological:  Negative for adenopathy. Does not bruise/bleed easily.  Psychiatric/Behavioral:  Positive for decreased concentration. Negative for agitation, behavioral problems, dysphoric mood, hallucinations, self-injury, sleep disturbance and suicidal ideas. The patient is not nervous/anxious and is not hyperactive.     Objective:  BP (!) 142/88 (BP Location: Left Arm, Patient Position: Sitting, Cuff Size: Large)   Pulse 74   Temp 98.1 F (36.7 C) (Oral)   Ht 5\' 10"  (1.778 m)   Wt 199 lb (90.3 kg)   SpO2 96%   BMI 28.55 kg/m   BP Readings from Last 3 Encounters:  04/20/22 (!) 142/88  01/01/22 135/82  08/28/21 (!) 125/91    Wt Readings from Last 3 Encounters:  04/20/22 199 lb (90.3 kg)  01/01/22 203 lb 8 oz (92.3 kg)  08/28/21 197 lb 12.8 oz (89.7 kg)    Physical Exam Vitals reviewed.  Constitutional:  General: He is not in acute distress.    Appearance: Normal appearance. He is ill-appearing. He is not toxic-appearing or diaphoretic.  HENT:     Nose: Nose normal.     Mouth/Throat:     Mouth: Mucous membranes are moist.  Eyes:     General: No scleral icterus.    Conjunctiva/sclera: Conjunctivae normal.  Cardiovascular:     Rate and Rhythm: Normal rate and regular rhythm.     Heart sounds: Normal heart sounds, S1 normal and S2 normal. No murmur heard.    No gallop.     Comments: EKG- NSR, 62 bpm Septal infarct  pattern - unchanged No LVH or acute ST/T wave changes Pulmonary:     Effort: Pulmonary effort is normal.     Breath sounds: No stridor. No wheezing, rhonchi or rales.  Abdominal:     General: Abdomen is flat.     Palpations: There is no mass.     Tenderness: There is no abdominal tenderness. There is no guarding.     Hernia: No hernia is present. There is no hernia in the left inguinal area or right inguinal area.  Genitourinary:    Pubic Area: No rash.      Penis: Normal and circumcised.      Testes: Normal.     Epididymis:     Right: Normal.     Left: Normal.     Prostate: Normal. Not enlarged, not tender and no nodules present.     Rectum: Normal. Guaiac result negative. No mass, tenderness, anal fissure, external hemorrhoid or internal hemorrhoid. Normal anal tone.  Musculoskeletal:        General: Normal range of motion.     Cervical back: Neck supple.     Right lower leg: No edema.     Left lower leg: No edema.  Lymphadenopathy:     Cervical: No cervical adenopathy.     Lower Body: No right inguinal adenopathy. No left inguinal adenopathy.  Skin:    General: Skin is warm and dry.  Neurological:     General: No focal deficit present.     Mental Status: He is alert. Mental status is at baseline.  Psychiatric:        Mood and Affect: Mood normal.        Behavior: Behavior normal.        Thought Content: Thought content normal.        Judgment: Judgment normal.     Lab Results  Component Value Date   WBC 3.5 (L) 04/20/2022   HGB 13.9 04/20/2022   HCT 40.9 04/20/2022   PLT 188.0 04/20/2022   GLUCOSE 108 (H) 04/20/2022   CHOL 132 04/20/2022   TRIG 104.0 04/20/2022   HDL 43.20 04/20/2022   LDLCALC 68 04/20/2022   ALT 34 04/20/2022   AST 44 (H) 04/20/2022   NA 132 (L) 04/20/2022   K 5.2 (H) 04/20/2022   CL 103 04/20/2022   CREATININE 1.14 04/20/2022   BUN 18 04/20/2022   CO2 24 04/20/2022   TSH 2.75 04/20/2022   PSA 3.32 04/20/2022   INR 1.1 (H) 04/20/2022    HGBA1C 5.4 04/20/2022    MR Abdomen W Wo Contrast  Result Date: 04/13/2022 CLINICAL DATA:  Follow-up of pancreatic cystic lesions. EXAM: MRI ABDOMEN WITHOUT AND WITH CONTRAST TECHNIQUE: Multiplanar multisequence MR imaging of the abdomen was performed both before and after the administration of intravenous contrast. CONTRAST:  9 cc Vueway COMPARISON:  04/20/2021 FINDINGS: Lower chest:  Normal heart size without pericardial or pleural effusion. Hepatobiliary: Again identified are multiple simple hepatic cysts, the largest of which is in the hepatic dome at 4.7 cm. No suspicious liver lesion or biliary abnormality. Pancreas: No pancreatic duct dilatation. Again identified are cystic lesions throughout the pancreas. The largest is within the pancreatic neck/body junction at 1.3 x 1.6 cm on 21/4. Compare 1.8 x 1.7 cm on the prior exam when measured in a similar fashion. 1.6 cm craniocaudal on 29/3 today versus similar on the prior exam (when remeasured). Similar in morphology, with at least 1 thin septation within. Pancreatic tail 9 mm cystic lesion on 21/4 and head lesion on 27/16 at 8 mm are also not significantly changed. No definite communication with the pancreatic duct of any of these lesions. No post-contrast worrisome characteristics. Spleen:  Normal in size, without focal abnormality. Adrenals/Urinary Tract: Normal adrenal glands. Bilateral too small to characterize renal lesions are most likely cysts . In the absence of clinically indicated signs/symptoms require(s) no independent follow-up. No hydronephrosis. Stomach/Bowel: Normal stomach and abdominal bowel loops. Vascular/Lymphatic: Aortic atherosclerosis. No retroperitoneal or retrocrural adenopathy. Other:  No ascites. Musculoskeletal: No acute osseous abnormality. IMPRESSION: 1. Multiple simple cystic lesions throughout the pancreas, primarily similar in size. The dominant neck/body junction lesion is similar to slightly decreased in size.  Differential considerations remain pseudocyst or indolent cystic neoplasm. Per consensus criteria, this warrants follow-up with pre and post contrast abdominal MRI at 1 year. 2.  No acute abdominal process. 3.  Aortic Atherosclerosis (ICD10-I70.0). Electronically Signed   By: Jeronimo Greaves M.D.   On: 04/13/2022 10:27    Assessment & Plan:    Encounter for general adult medical examination with abnormal findings- Exam completed, labs reviewed, vaccines reviewed and updated, cancer screenings are up-to-date, patient education was given. -     PSA; Future  Elevated LFTs- His liver enzymes have improved.  I have encouraged him to avoid alcohol.  Recent MRI was remarkable only for cysts. -     Hepatic function panel; Future -     Protime-INR; Future  Hyperglycemia- A1C is normal. -     Basic metabolic panel; Future -     Hemoglobin A1c; Future  Iron deficiency anemia due to chronic blood loss- H/H are normal. -     IBC + Ferritin; Future -     CBC with Differential/Platelet; Future  Iron overload- Iron is normal. -     IBC + Ferritin; Future  Primary hypertension- BP is not at goal. Will restart the ARB. -     TSH; Future -     EKG 12-Lead  Hypertriglyceridemia -     Lipid panel; Future  Deficiency anemia- H/H are normal now. -     Vitamin B12; Future -     Zinc; Future -     Folate; Future -     Vitamin B1; Future -     Reticulocytes; Future -     Testosterone Total,Free,Bio, Males; Future  DOE (dyspnea on exertion)-Labs are normal.  Will evaluate for CHF and wall motion abnormalities. -     Troponin I (High Sensitivity); Future -     Brain natriuretic peptide; Future -     ECHOCARDIOGRAM COMPLETE; Future  Abnormal electrocardiogram (ECG) (EKG) -     Troponin I (High Sensitivity); Future -     Brain natriuretic peptide; Future -     ECHOCARDIOGRAM COMPLETE; Future  Chronic fatigue -     Testosterone Total,Free,Bio, Males; Future  In addition to time spent on CPE, I  spent 50 minutes in preparing to see the patient by review of imaging, obtaining and reviewing separately obtained history, communicating with the patient, ordering medications and tests, and documenting clinical information in the EHR including the differential Dx, treatment, and any further evaluation and management of multiple complex medical issues.     Follow-up: Return in about 3 months (around 07/20/2022).  Sanda Linger, MD

## 2022-04-22 DIAGNOSIS — H903 Sensorineural hearing loss, bilateral: Secondary | ICD-10-CM | POA: Insufficient documentation

## 2022-04-22 MED ORDER — VALSARTAN 160 MG PO TABS: 160.0000 mg | ORAL_TABLET | Freq: Every day | ORAL | 0 refills | Status: DC

## 2022-04-25 ENCOUNTER — Other Ambulatory Visit: Payer: Self-pay | Admitting: Internal Medicine

## 2022-04-25 DIAGNOSIS — F0280 Dementia in other diseases classified elsewhere without behavioral disturbance: Secondary | ICD-10-CM | POA: Insufficient documentation

## 2022-04-25 DIAGNOSIS — E519 Thiamine deficiency, unspecified: Secondary | ICD-10-CM

## 2022-04-25 LAB — RETICULOCYTES
ABS Retic: 98070 cells/uL — ABNORMAL HIGH (ref 25000–90000)
Retic Ct Pct: 2.1 %

## 2022-04-25 LAB — TESTOSTERONE TOTAL,FREE,BIO, MALES
Albumin: 4.2 g/dL (ref 3.6–5.1)
Sex Hormone Binding: 25 nmol/L (ref 22–77)
Testosterone: 204 ng/dL — ABNORMAL LOW (ref 250–827)

## 2022-04-25 LAB — VITAMIN B1: Vitamin B1 (Thiamine): 6 nmol/L — ABNORMAL LOW (ref 8–30)

## 2022-04-25 LAB — ZINC: Zinc: 68 ug/dL (ref 60–130)

## 2022-04-25 MED ORDER — VITAMIN B-1 50 MG PO TABS: 50.0000 mg | ORAL_TABLET | Freq: Every day | ORAL | 1 refills | Status: DC

## 2022-05-01 ENCOUNTER — Ambulatory Visit: Payer: BC Managed Care – PPO | Attending: Audiologist | Admitting: Audiologist

## 2022-05-01 DIAGNOSIS — H9193 Unspecified hearing loss, bilateral: Secondary | ICD-10-CM | POA: Insufficient documentation

## 2022-05-01 NOTE — Procedures (Signed)
  Outpatient Audiology and [facility] [address] [place], [place]  [address] [phone]  AUDIOLOGICAL  EVALUATION  NAME: Keith Bullock     DOB:   Jan 08, 1961      MRN: [id number]                                                                                     DATE: 05/01/2022     REFERENT: Etta Grandchild, MD STATUS: Outpatient DIAGNOSIS: Decreased Hearing     History: Keith Bullock was seen for an audiological evaluation.  Keith Bullock is receiving a hearing evaluation due to concerns for difficulty hearing in noise. Keith Bullock has difficulty hearing in places like restaurants but hears well one on one. This difficulty began gradually. It started getting worse after he had COVID. No pain or pressure reported in either ear.  Tinnitus denied in both ears. [name] has no noise exposure history.  Medical history positive for cognitive changes which is a risk factor for hearing loss. No other relevant case history reported.   Evaluation:  Otoscopy showed a clear view of the tympanic membranes, bilaterally Tympanometry results were consistent with normal middle ear function, bilaterally   Audiometric testing was completed using conventional audiometry with supraural transducer. Speech Recognition Thresholds were 20dB in the right ear and 10dB in the left ear. Word Recognition was performed 60dB, scored 100% in the right ear and 100% in the left ear. Pure tone thresholds show normal hering in each ear.  Quick Speech in Noise Test (QuickSIN):  list of six sentences with five key words per sentence is presented in four-talker babble noise. The sentences are presented at pre- recorded signal-to-noise ratios which decrease in 5-dB steps from 25 (very easy) to 0 (extremely difficult). The SNRs used are: 25, 20, 15, 10, 5 and 0, encompassing normal to severely impaired performance in noise. Taxes binaural separation and discrimination skills. [name] performed 1.5dB SNR Loss for both ears.   [name] scored in normal range for hearing in noise.    Results:  The test results were reviewed with [name]. He has normal hearing in each ear. He scored in the normal range on the speech in noise test as well. There is no need for hearing aids or follow up. He is he wants help hearing intermittently, he can try over the counter devices. He was given a list of quality OTC amplifier options.    Recommendations: 1.   No further audiologic testing is needed unless future hearing concerns arise. [name] was given a list of quality over the counter amplifiers to use when feeling fatigued to help with hearing in noise.    28 minutes spent testing and counseling on results.   Ammie Ferrier  Audiologist, Au.D., CCC-A 05/01/2022  8:26 AM  Cc: Etta Grandchild, MD

## 2022-05-13 ENCOUNTER — Telehealth (HOSPITAL_BASED_OUTPATIENT_CLINIC_OR_DEPARTMENT_OTHER): Payer: Self-pay | Admitting: *Deleted

## 2022-05-13 NOTE — Telephone Encounter (Signed)
Left message for patient to call and discuss scheduling the echocardiogram ordered by Dr. Sanda Linger

## 2022-06-10 ENCOUNTER — Ambulatory Visit (INDEPENDENT_AMBULATORY_CARE_PROVIDER_SITE_OTHER): Payer: BC Managed Care – PPO

## 2022-06-10 DIAGNOSIS — R9431 Abnormal electrocardiogram [ECG] [EKG]: Secondary | ICD-10-CM | POA: Diagnosis not present

## 2022-06-10 DIAGNOSIS — R0609 Other forms of dyspnea: Secondary | ICD-10-CM

## 2022-06-10 LAB — ECHOCARDIOGRAM COMPLETE
Area-P 1/2: 3.03 cm2
S' Lateral: 3.19 cm

## 2022-06-17 ENCOUNTER — Telehealth: Payer: Self-pay | Admitting: Hematology and Oncology

## 2022-06-17 NOTE — Telephone Encounter (Signed)
Left patient a vm regarding upcoming appointment  

## 2022-06-23 ENCOUNTER — Other Ambulatory Visit: Payer: Self-pay | Admitting: Internal Medicine

## 2022-06-23 DIAGNOSIS — E785 Hyperlipidemia, unspecified: Secondary | ICD-10-CM

## 2022-07-03 ENCOUNTER — Ambulatory Visit: Payer: BC Managed Care – PPO | Admitting: Hematology and Oncology

## 2022-07-03 ENCOUNTER — Other Ambulatory Visit: Payer: BC Managed Care – PPO

## 2022-07-10 ENCOUNTER — Ambulatory Visit: Payer: BC Managed Care – PPO | Admitting: Hematology and Oncology

## 2022-07-10 ENCOUNTER — Other Ambulatory Visit: Payer: BC Managed Care – PPO

## 2022-07-20 ENCOUNTER — Encounter: Payer: Self-pay | Admitting: Internal Medicine

## 2022-07-20 ENCOUNTER — Ambulatory Visit (INDEPENDENT_AMBULATORY_CARE_PROVIDER_SITE_OTHER): Payer: BC Managed Care – PPO | Admitting: Internal Medicine

## 2022-07-20 ENCOUNTER — Ambulatory Visit (INDEPENDENT_AMBULATORY_CARE_PROVIDER_SITE_OTHER): Payer: BC Managed Care – PPO

## 2022-07-20 VITALS — BP 138/86 | HR 80 | Temp 98.3°F | Ht 70.0 in | Wt 196.0 lb

## 2022-07-20 DIAGNOSIS — N401 Enlarged prostate with lower urinary tract symptoms: Secondary | ICD-10-CM | POA: Diagnosis not present

## 2022-07-20 DIAGNOSIS — R0789 Other chest pain: Secondary | ICD-10-CM | POA: Insufficient documentation

## 2022-07-20 DIAGNOSIS — R079 Chest pain, unspecified: Secondary | ICD-10-CM | POA: Diagnosis not present

## 2022-07-20 DIAGNOSIS — K7010 Alcoholic hepatitis without ascites: Secondary | ICD-10-CM | POA: Diagnosis not present

## 2022-07-20 DIAGNOSIS — R3916 Straining to void: Secondary | ICD-10-CM

## 2022-07-20 DIAGNOSIS — R0781 Pleurodynia: Secondary | ICD-10-CM | POA: Diagnosis not present

## 2022-07-20 DIAGNOSIS — I10 Essential (primary) hypertension: Secondary | ICD-10-CM

## 2022-07-20 DIAGNOSIS — F1010 Alcohol abuse, uncomplicated: Secondary | ICD-10-CM | POA: Diagnosis not present

## 2022-07-20 LAB — BASIC METABOLIC PANEL
BUN: 14 mg/dL (ref 6–23)
CO2: 26 mEq/L (ref 19–32)
Calcium: 9.8 mg/dL (ref 8.4–10.5)
Chloride: 100 mEq/L (ref 96–112)
Creatinine, Ser: 1.24 mg/dL (ref 0.40–1.50)
GFR: 62.59 mL/min (ref >60.00)
Glucose, Bld: 103 mg/dL — ABNORMAL HIGH (ref 70–99)
Potassium: 4.9 mEq/L (ref 3.5–5.1)
Sodium: 133 mEq/L — ABNORMAL LOW (ref 135–145)

## 2022-07-20 LAB — HEPATIC FUNCTION PANEL
ALT: 39 U/L (ref 0–53)
AST: 48 U/L — ABNORMAL HIGH (ref 0–37)
Albumin: 4.5 g/dL (ref 3.5–5.2)
Alkaline Phosphatase: 57 U/L (ref 39–117)
Bilirubin, Direct: 0.2 mg/dL (ref 0.0–0.3)
Total Bilirubin: 0.9 mg/dL (ref 0.2–1.2)
Total Protein: 7.7 g/dL (ref 6.0–8.3)

## 2022-07-20 MED ORDER — NALTREXONE HCL 50 MG PO TABS: 50.0000 mg | ORAL_TABLET | Freq: Every day | ORAL | 1 refills | Status: DC

## 2022-07-20 MED ORDER — TADALAFIL 5 MG PO TABS: 5.0000 mg | ORAL_TABLET | Freq: Every day | ORAL | 1 refills | Status: DC

## 2022-07-20 NOTE — Progress Notes (Signed)
Subjective:  Patient ID: Keith Bullock, male    DOB: [birth year]  Age: 62 y.o. MRN: [id number]  CC: Hypertension   HPI BELAL ALVARDO presents for f/up ---  Discussed the use of AI scribe software for clinical note transcription with the patient, who gave verbal consent to proceed.  History of Present Illness   The patient, with a history of hypertension, liver disease, and liver cyst, presents for a follow-up visit. They report occasional right lower chest wall pain, which has been exacerbated by a recent incident involving a fall from a step ladder. The fall resulted in a 'crunch' in the right ribcage area, causing intermittent pain. The patient has been managing the pain with naproxen, which seems to provide relief. They also mention a history of lower back pain, which has not been problematic recently, but express a desire for a refill of tramadol as a precaution.  The patient acknowledges ongoing struggles with alcohol consumption, with periods of abstinence lasting a few days to a week, followed by relapses. They express interest in trying naltrexone, a medication that can help reduce alcohol cravings. They deny any physical symptoms that might suggest alcohol-related damage but do report occasional 'brain fog.' He is not willing to attend meetings.  They deny any symptoms of chest pain, shortness of breath, or cold sweats.       Outpatient Medications Prior to Visit  Medication Sig Dispense Refill   cetirizine (ZYRTEC) 10 MG tablet Take 10 mg by mouth daily as needed for allergies.     ezetimibe (ZETIA) 10 MG tablet TAKE 1 TABLET (10 MG TOTAL) BY MOUTH DAILY AT BEDTIME 90 tablet 1   Famotidine-Ca Carb-Mag Hydrox (PEPCID COMPLETE PO) Take 1 tablet by mouth daily as needed (acid reflux).     Multiple Vitamin (MULTIVITAMIN WITH MINERALS) TABS tablet Take 1 tablet by mouth daily.     thiamine (VITAMIN B-1) 50 MG tablet Take 1 tablet (50 mg total) by mouth daily. 90 tablet 1    valsartan (DIOVAN) 160 MG tablet Take 1 tablet (160 mg total) by mouth daily. 90 tablet 0   No facility-administered medications prior to visit.    ROS Review of Systems  Constitutional: Negative.  Negative for chills, diaphoresis, fatigue and fever.  HENT: Negative.    Eyes: Negative.   Respiratory:  Negative for cough, chest tightness, shortness of breath and wheezing.   Cardiovascular:  Positive for chest pain. Negative for palpitations and leg swelling.  Gastrointestinal:  Negative for abdominal pain, constipation, diarrhea, nausea and vomiting.  Endocrine: Negative.   Genitourinary: Negative.  Negative for difficulty urinating.  Musculoskeletal:  Positive for back pain. Negative for arthralgias.  Skin: Negative.   Neurological: Negative.  Negative for dizziness and weakness.  Hematological:  Negative for adenopathy. Does not bruise/bleed easily.  Psychiatric/Behavioral: Negative.      Objective:  BP 138/86 (BP Location: Right Arm, Patient Position: Sitting, Cuff Size: Large)   Pulse 80   Temp 98.3 F (36.8 C) (Oral)   Ht 5\' 10"  (1.778 m)   Wt 196 lb (88.9 kg)   SpO2 97%   BMI 28.12 kg/m   BP Readings from Last 3 Encounters:  07/20/22 138/86  04/20/22 (!) 142/88  01/01/22 135/82    Wt Readings from Last 3 Encounters:  07/20/22 196 lb (88.9 kg)  04/20/22 199 lb (90.3 kg)  01/01/22 203 lb 8 oz (92.3 kg)    Physical Exam Vitals reviewed.  Constitutional:  General: He is not in acute distress.    Appearance: Normal appearance. He is not toxic-appearing or diaphoretic.  HENT:     Nose: Nose normal.     Mouth/Throat:     Mouth: Mucous membranes are moist.  Eyes:     Conjunctiva/sclera: Conjunctivae normal.  Cardiovascular:     Rate and Rhythm: Normal rate and regular rhythm.     Heart sounds: No murmur heard.    No friction rub. No gallop.  Pulmonary:     Effort: Pulmonary effort is normal.     Breath sounds: No stridor. No wheezing, rhonchi or rales.   Chest:     Chest wall: Tenderness present.  Abdominal:     General: Abdomen is flat.     Palpations: There is no mass.     Tenderness: There is no abdominal tenderness. There is no guarding.     Hernia: No hernia is present.  Musculoskeletal:        General: Normal range of motion.     Cervical back: Neck supple.     Right lower leg: No edema.     Left lower leg: No edema.  Lymphadenopathy:     Cervical: No cervical adenopathy.  Skin:    General: Skin is warm and dry.  Neurological:     General: No focal deficit present.     Mental Status: He is alert.  Psychiatric:        Mood and Affect: Mood normal.     Lab Results  Component Value Date   WBC 3.5 (L) 04/20/2022   HGB 13.9 04/20/2022   HCT 40.9 04/20/2022   PLT 188.0 04/20/2022   GLUCOSE 103 (H) 07/20/2022   CHOL 132 04/20/2022   TRIG 104.0 04/20/2022   HDL 43.20 04/20/2022   LDLCALC 68 04/20/2022   ALT 39 07/20/2022   AST 48 (H) 07/20/2022   NA 133 (L) 07/20/2022   K 4.9 07/20/2022   CL 100 07/20/2022   CREATININE 1.24 07/20/2022   BUN 14 07/20/2022   CO2 26 07/20/2022   TSH 2.75 04/20/2022   PSA 3.32 04/20/2022   INR 1.1 (H) 04/20/2022   HGBA1C 5.4 04/20/2022    MR Abdomen W Wo Contrast  Result Date: 04/13/2022 CLINICAL DATA:  Follow-up of pancreatic cystic lesions. EXAM: MRI ABDOMEN WITHOUT AND WITH CONTRAST TECHNIQUE: Multiplanar multisequence MR imaging of the abdomen was performed both before and after the administration of intravenous contrast. CONTRAST:  9 cc Vueway COMPARISON:  04/20/2021 FINDINGS: Lower chest: Normal heart size without pericardial or pleural effusion. Hepatobiliary: Again identified are multiple simple hepatic cysts, the largest of which is in the hepatic dome at 4.7 cm. No suspicious liver lesion or biliary abnormality. Pancreas: No pancreatic duct dilatation. Again identified are cystic lesions throughout the pancreas. The largest is within the pancreatic neck/body junction at 1.3 x  1.6 cm on 21/4. Compare 1.8 x 1.7 cm on the prior exam when measured in a similar fashion. 1.6 cm craniocaudal on 29/3 today versus similar on the prior exam (when remeasured). Similar in morphology, with at least 1 thin septation within. Pancreatic tail 9 mm cystic lesion on 21/4 and head lesion on 27/16 at 8 mm are also not significantly changed. No definite communication with the pancreatic duct of any of these lesions. No post-contrast worrisome characteristics. Spleen:  Normal in size, without focal abnormality. Adrenals/Urinary Tract: Normal adrenal glands. Bilateral too small to characterize renal lesions are most likely cysts . In the absence of  clinically indicated signs/symptoms require(s) no independent follow-up. No hydronephrosis. Stomach/Bowel: Normal stomach and abdominal bowel loops. Vascular/Lymphatic: Aortic atherosclerosis. No retroperitoneal or retrocrural adenopathy. Other:  No ascites. Musculoskeletal: No acute osseous abnormality. IMPRESSION: 1. Multiple simple cystic lesions throughout the pancreas, primarily similar in size. The dominant neck/body junction lesion is similar to slightly decreased in size. Differential considerations remain pseudocyst or indolent cystic neoplasm. Per consensus criteria, this warrants follow-up with pre and post contrast abdominal MRI at 1 year. 2.  No acute abdominal process. 3.  Aortic Atherosclerosis (ICD10-I70.0). Electronically Signed   By: Jeronimo Greaves M.D.   On: 04/13/2022 10:27   DG Ribs Unilateral Right  Result Date: 07/20/2022 CLINICAL DATA:  pain 3 weeks after a falll EXAM: RIGHT RIBS - 2 VIEW COMPARISON:  None Available. FINDINGS: No acute fracture or other bone lesions are seen involving the ribs. No pneumothorax or effusion. IMPRESSION: Negative. Electronically Signed   By: Corlis Leak M.D.   On: 07/20/2022 10:56   DG Chest 2 View  Result Date: 07/20/2022 CLINICAL DATA:  Right chest pain EXAM: CHEST - 2 VIEW COMPARISON:  None Available.  FINDINGS: The heart size and mediastinal contours are within normal limits. Both lungs are clear. Old left-sided rib fractures. IMPRESSION: No active cardiopulmonary disease. Electronically Signed   By: Allegra Lai M.D.   On: 07/20/2022 10:29     Assessment & Plan:   Right-sided chest wall pain- X-rays are negative for fracture. -     DG Ribs Unilateral Right; Future -     DG Chest 2 View; Future  Benign prostatic hyperplasia (BPH) with straining on urination -     Tadalafil; Take 1 tablet (5 mg total) by mouth daily.  Dispense: 90 tablet; Refill: 1  Excessive drinking of alcohol -     Naltrexone HCl; Take 1 tablet (50 mg total) by mouth daily.  Dispense: 90 tablet; Refill: 1  Primary hypertension- His blood pressure is well-controlled. -     Hepatic function panel; Future -     Basic metabolic panel; Future -     Valsartan; Take 1 tablet (160 mg total) by mouth daily.  Dispense: 90 tablet; Refill: 0  Alcoholic hepatitis without ascites- I have asked him to try to abstain from alcohol intake.  He is not willing to attend AA meetings. -     Hepatic function panel; Future -     Naltrexone HCl; Take 1 tablet (50 mg total) by mouth daily.  Dispense: 90 tablet; Refill: 1     Follow-up: Return in about 7 months (around 02/20/2023).  Sanda Linger, MD

## 2022-07-20 NOTE — Patient Instructions (Signed)
Hypertension, Adult High blood pressure (hypertension) is when the force of blood pumping through the arteries is too strong. The arteries are the blood vessels that carry blood from the heart throughout the body. Hypertension forces the heart to work harder to pump blood and may cause arteries to become narrow or stiff. Untreated or uncontrolled hypertension can lead to a heart attack, heart failure, a stroke, kidney disease, and other problems. A blood pressure reading consists of a higher number over a lower number. Ideally, your blood pressure should be below 120/80. The first ("top") number is called the systolic pressure. It is a measure of the pressure in your arteries as your heart beats. The second ("bottom") number is called the diastolic pressure. It is a measure of the pressure in your arteries as the heart relaxes. What are the causes? The exact cause of this condition is not known. There are some conditions that result in high blood pressure. What increases the risk? Certain factors may make you more likely to develop high blood pressure. Some of these risk factors are under your control, including: Smoking. Not getting enough exercise or physical activity. Being overweight. Having too much fat, sugar, calories, or salt (sodium) in your diet. Drinking too much alcohol. Other risk factors include: Having a personal history of heart disease, diabetes, high cholesterol, or kidney disease. Stress. Having a family history of high blood pressure and high cholesterol. Having obstructive sleep apnea. Age. The risk increases with age. What are the signs or symptoms? High blood pressure may not cause symptoms. Very high blood pressure (hypertensive crisis) may cause: Headache. Fast or irregular heartbeats (palpitations). Shortness of breath. Nosebleed. Nausea and vomiting. Vision changes. Severe chest pain, dizziness, and seizures. How is this diagnosed? This condition is diagnosed by  measuring your blood pressure while you are seated, with your arm resting on a flat surface, your legs uncrossed, and your feet flat on the floor. The cuff of the blood pressure monitor will be placed directly against the skin of your upper arm at the level of your heart. Blood pressure should be measured at least twice using the same arm. Certain conditions can cause a difference in blood pressure between your right and left arms. If you have a high blood pressure reading during one visit or you have normal blood pressure with other risk factors, you may be asked to: Return on a different day to have your blood pressure checked again. Monitor your blood pressure at home for 1 week or longer. If you are diagnosed with hypertension, you may have other blood or imaging tests to help your health care provider understand your overall risk for other conditions. How is this treated? This condition is treated by making healthy lifestyle changes, such as eating healthy foods, exercising more, and reducing your alcohol intake. You may be referred for counseling on a healthy diet and physical activity. Your health care provider may prescribe medicine if lifestyle changes are not enough to get your blood pressure under control and if: Your systolic blood pressure is above 130. Your diastolic blood pressure is above 80. Your personal target blood pressure may vary depending on your medical conditions, your age, and other factors. Follow these instructions at home: Eating and drinking  Eat a diet that is high in fiber and potassium, and low in sodium, added sugar, and fat. An example of this eating plan is called the DASH diet. DASH stands for Dietary Approaches to Stop Hypertension. To eat this way: Eat   plenty of fresh fruits and vegetables. Try to fill one half of your plate at each meal with fruits and vegetables. Eat whole grains, such as whole-wheat pasta, brown rice, or whole-grain bread. Fill about one  fourth of your plate with whole grains. Eat or drink low-fat dairy products, such as skim milk or low-fat yogurt. Avoid fatty cuts of meat, processed or cured meats, and poultry with skin. Fill about one fourth of your plate with lean proteins, such as fish, chicken without skin, beans, eggs, or tofu. Avoid pre-made and processed foods. These tend to be higher in sodium, added sugar, and fat. Reduce your daily sodium intake. Many people with hypertension should eat less than 1,500 mg of sodium a day. Do not drink alcohol if: Your health care provider tells you not to drink. You are pregnant, may be pregnant, or are planning to become pregnant. If you drink alcohol: Limit how much you have to: 0-1 drink a day for women. 0-2 drinks a day for men. Know how much alcohol is in your drink. In the [place] one drink equals one 12 oz bottle of beer (355 mL), one 5 oz glass of wine (148 mL), or one 1 oz glass of hard liquor (44 mL). Lifestyle  Work with your health care provider to maintain a healthy body weight or to lose weight. Ask what an ideal weight is for you. Get at least 30 minutes of exercise that causes your heart to beat faster (aerobic exercise) most days of the week. Activities may include walking, swimming, or biking. Include exercise to strengthen your muscles (resistance exercise), such as Pilates or lifting weights, as part of your weekly exercise routine. Try to do these types of exercises for 30 minutes at least 3 days a week. Do not use any products that contain nicotine or tobacco. These products include cigarettes, chewing tobacco, and vaping devices, such as e-cigarettes. If you need help quitting, ask your health care provider. Monitor your blood pressure at home as told by your health care provider. Keep all follow-up visits. This is important. Medicines Take over-the-counter and prescription medicines only as told by your health care provider. Follow directions carefully. Blood  pressure medicines must be taken as prescribed. Do not skip doses of blood pressure medicine. Doing this puts you at risk for problems and can make the medicine less effective. Ask your health care provider about side effects or reactions to medicines that you should watch for. Contact a health care provider if you: Think you are having a reaction to a medicine you are taking. Have headaches that keep coming back (recurring). Feel dizzy. Have swelling in your ankles. Have trouble with your vision. Get help right away if you: Develop a severe headache or confusion. Have unusual weakness or numbness. Feel faint. Have severe pain in your chest or abdomen. Vomit repeatedly. Have trouble breathing. These symptoms may be an emergency. Get help right away. Call 911. Do not wait to see if the symptoms will go away. Do not drive yourself to the hospital. Summary Hypertension is when the force of blood pumping through your arteries is too strong. If this condition is not controlled, it may put you at risk for serious complications. Your personal target blood pressure may vary depending on your medical conditions, your age, and other factors. For most people, a normal blood pressure is less than 120/80. Hypertension is treated with lifestyle changes, medicines, or a combination of both. Lifestyle changes include losing weight, eating a healthy,   low-sodium diet, exercising more, and limiting alcohol. This information is not intended to replace advice given to you by your health care provider. Make sure you discuss any questions you have with your health care provider. Document Revised: 11/12/2020 Document Reviewed: 11/12/2020 Else[employer] Patient Education  2024 [employer]  

## 2022-07-21 ENCOUNTER — Other Ambulatory Visit: Payer: Self-pay | Admitting: Internal Medicine

## 2022-07-21 DIAGNOSIS — I10 Essential (primary) hypertension: Secondary | ICD-10-CM

## 2022-07-21 MED ORDER — VALSARTAN 160 MG PO TABS: 160.0000 mg | ORAL_TABLET | Freq: Every day | ORAL | 0 refills | Status: DC

## 2022-10-22 ENCOUNTER — Other Ambulatory Visit: Payer: Self-pay | Admitting: Internal Medicine

## 2022-10-22 DIAGNOSIS — I10 Essential (primary) hypertension: Secondary | ICD-10-CM

## 2022-12-25 ENCOUNTER — Other Ambulatory Visit: Payer: Self-pay | Admitting: Internal Medicine

## 2022-12-25 DIAGNOSIS — E785 Hyperlipidemia, unspecified: Secondary | ICD-10-CM

## 2023-01-18 ENCOUNTER — Other Ambulatory Visit: Payer: Self-pay | Admitting: Internal Medicine

## 2023-01-18 DIAGNOSIS — N401 Enlarged prostate with lower urinary tract symptoms: Secondary | ICD-10-CM

## 2023-01-18 DIAGNOSIS — I10 Essential (primary) hypertension: Secondary | ICD-10-CM

## 2023-01-25 ENCOUNTER — Ambulatory Visit: Payer: BC Managed Care – PPO | Admitting: Internal Medicine

## 2023-02-25 ENCOUNTER — Ambulatory Visit: Payer: BC Managed Care – PPO | Admitting: Internal Medicine

## 2023-03-24 ENCOUNTER — Telehealth: Payer: Self-pay | Admitting: Internal Medicine

## 2023-03-24 ENCOUNTER — Other Ambulatory Visit: Payer: Self-pay | Admitting: Internal Medicine

## 2023-03-24 DIAGNOSIS — K862 Cyst of pancreas: Secondary | ICD-10-CM | POA: Insufficient documentation

## 2023-03-24 DIAGNOSIS — K7689 Other specified diseases of liver: Secondary | ICD-10-CM

## 2023-03-24 NOTE — Telephone Encounter (Signed)
-----   Message from Sanda Linger sent at 04/13/2022 12:45 PM EDT ----- Regarding: MRI pancreas repeat

## 2023-03-25 ENCOUNTER — Ambulatory Visit: Payer: BC Managed Care – PPO | Admitting: Internal Medicine

## 2023-03-25 ENCOUNTER — Encounter: Payer: Self-pay | Admitting: Internal Medicine

## 2023-03-25 VITALS — BP 162/78 | HR 60 | Temp 97.9°F | Resp 16 | Ht 70.0 in | Wt 208.4 lb

## 2023-03-25 DIAGNOSIS — E519 Thiamine deficiency, unspecified: Secondary | ICD-10-CM

## 2023-03-25 DIAGNOSIS — Z860100 Personal history of colon polyps, unspecified: Secondary | ICD-10-CM | POA: Insufficient documentation

## 2023-03-25 DIAGNOSIS — E781 Pure hyperglyceridemia: Secondary | ICD-10-CM | POA: Diagnosis not present

## 2023-03-25 DIAGNOSIS — N400 Enlarged prostate without lower urinary tract symptoms: Secondary | ICD-10-CM | POA: Insufficient documentation

## 2023-03-25 DIAGNOSIS — Z800 Family history of malignant neoplasm of digestive organs: Secondary | ICD-10-CM | POA: Insufficient documentation

## 2023-03-25 DIAGNOSIS — K760 Fatty (change of) liver, not elsewhere classified: Secondary | ICD-10-CM | POA: Insufficient documentation

## 2023-03-25 DIAGNOSIS — I10 Essential (primary) hypertension: Secondary | ICD-10-CM

## 2023-03-25 DIAGNOSIS — F0280 Dementia in other diseases classified elsewhere without behavioral disturbance: Secondary | ICD-10-CM

## 2023-03-25 DIAGNOSIS — R413 Other amnesia: Secondary | ICD-10-CM | POA: Insufficient documentation

## 2023-03-25 DIAGNOSIS — F329 Major depressive disorder, single episode, unspecified: Secondary | ICD-10-CM | POA: Insufficient documentation

## 2023-03-25 DIAGNOSIS — M545 Low back pain, unspecified: Secondary | ICD-10-CM | POA: Insufficient documentation

## 2023-03-25 DIAGNOSIS — K635 Polyp of colon: Secondary | ICD-10-CM | POA: Insufficient documentation

## 2023-03-25 DIAGNOSIS — N401 Enlarged prostate with lower urinary tract symptoms: Secondary | ICD-10-CM

## 2023-03-25 DIAGNOSIS — R748 Abnormal levels of other serum enzymes: Secondary | ICD-10-CM

## 2023-03-25 DIAGNOSIS — K862 Cyst of pancreas: Secondary | ICD-10-CM

## 2023-03-25 DIAGNOSIS — K7010 Alcoholic hepatitis without ascites: Secondary | ICD-10-CM

## 2023-03-25 DIAGNOSIS — R935 Abnormal findings on diagnostic imaging of other abdominal regions, including retroperitoneum: Secondary | ICD-10-CM | POA: Diagnosis not present

## 2023-03-25 DIAGNOSIS — H9313 Tinnitus, bilateral: Secondary | ICD-10-CM | POA: Insufficient documentation

## 2023-03-25 DIAGNOSIS — R7309 Other abnormal glucose: Secondary | ICD-10-CM | POA: Diagnosis not present

## 2023-03-25 DIAGNOSIS — E782 Mixed hyperlipidemia: Secondary | ICD-10-CM

## 2023-03-25 DIAGNOSIS — H903 Sensorineural hearing loss, bilateral: Secondary | ICD-10-CM

## 2023-03-25 DIAGNOSIS — K5900 Constipation, unspecified: Secondary | ICD-10-CM | POA: Insufficient documentation

## 2023-03-25 LAB — CBC WITH DIFFERENTIAL/PLATELET
Basophils Absolute: 0 10*3/uL (ref 0.0–0.1)
Basophils Relative: 0.9 % (ref 0.0–3.0)
Eosinophils Absolute: 0.1 10*3/uL (ref 0.0–0.7)
Eosinophils Relative: 1.8 % (ref 0.0–5.0)
HCT: 42 % (ref 39.0–52.0)
Hemoglobin: 14 g/dL (ref 13.0–17.0)
Lymphocytes Relative: 35.8 % (ref 12.0–46.0)
Lymphs Abs: 1.5 10*3/uL (ref 0.7–4.0)
MCHC: 33.3 g/dL (ref 30.0–36.0)
MCV: 89.2 fl (ref 78.0–100.0)
Monocytes Absolute: 0.6 10*3/uL (ref 0.1–1.0)
Monocytes Relative: 13.7 % — ABNORMAL HIGH (ref 3.0–12.0)
Neutro Abs: 1.9 10*3/uL (ref 1.4–7.7)
Neutrophils Relative %: 47.8 % (ref 43.0–77.0)
Platelets: 188 10*3/uL (ref 150.0–400.0)
RBC: 4.7 Mil/uL (ref 4.22–5.81)
RDW: 14.8 % (ref 11.5–15.5)
WBC: 4.1 10*3/uL (ref 4.0–10.5)

## 2023-03-25 LAB — URINALYSIS, ROUTINE W REFLEX MICROSCOPIC
Bilirubin Urine: NEGATIVE
Hgb urine dipstick: NEGATIVE
Ketones, ur: NEGATIVE
Leukocytes,Ua: NEGATIVE
Nitrite: NEGATIVE
RBC / HPF: NONE SEEN (ref 0–?)
Specific Gravity, Urine: 1.01 (ref 1.000–1.030)
Total Protein, Urine: NEGATIVE
Urine Glucose: NEGATIVE
Urobilinogen, UA: 0.2 (ref 0.0–1.0)
WBC, UA: NONE SEEN (ref 0–?)
pH: 6 (ref 5.0–8.0)

## 2023-03-25 LAB — BASIC METABOLIC PANEL
BUN: 20 mg/dL (ref 6–23)
CO2: 28 meq/L (ref 19–32)
Calcium: 9.1 mg/dL (ref 8.4–10.5)
Chloride: 99 meq/L (ref 96–112)
Creatinine, Ser: 1.17 mg/dL (ref 0.40–1.50)
GFR: 66.79 mL/min (ref >60.00)
Glucose, Bld: 115 mg/dL — ABNORMAL HIGH (ref 70–99)
Potassium: 4.4 meq/L (ref 3.5–5.1)
Sodium: 133 meq/L — ABNORMAL LOW (ref 135–145)

## 2023-03-25 LAB — IBC + FERRITIN
Ferritin: 17.9 ng/mL — ABNORMAL LOW (ref 22.0–322.0)
Iron: 100 ug/dL (ref 42–165)
Saturation Ratios: 22.1 % (ref 20.0–50.0)
TIBC: 452.2 ug/dL — ABNORMAL HIGH (ref 250.0–450.0)
Transferrin: 323 mg/dL (ref 212.0–360.0)

## 2023-03-25 LAB — HEPATIC FUNCTION PANEL
ALT: 38 U/L (ref 0–53)
AST: 39 U/L — ABNORMAL HIGH (ref 0–37)
Albumin: 4.2 g/dL (ref 3.5–5.2)
Alkaline Phosphatase: 54 U/L (ref 39–117)
Bilirubin, Direct: 0.2 mg/dL (ref 0.0–0.3)
Total Bilirubin: 0.8 mg/dL (ref 0.2–1.2)
Total Protein: 6.5 g/dL (ref 6.0–8.3)

## 2023-03-25 LAB — LIPID PANEL
Cholesterol: 127 mg/dL (ref 0–200)
HDL: 42.8 mg/dL (ref >39.00)
LDL Cholesterol: 65 mg/dL (ref 0–99)
NonHDL: 83.77
Total CHOL/HDL Ratio: 3
Triglycerides: 94 mg/dL (ref 0.0–149.0)
VLDL: 18.8 mg/dL (ref 0.0–40.0)

## 2023-03-25 LAB — TSH: TSH: 2.98 u[IU]/mL (ref 0.35–5.50)

## 2023-03-25 LAB — PROTIME-INR
INR: 1.2 ratio — ABNORMAL HIGH (ref 0.8–1.0)
Prothrombin Time: 12.3 s (ref 9.6–13.1)

## 2023-03-25 LAB — PSA: PSA: 4.06 ng/mL — ABNORMAL HIGH (ref 0.10–4.00)

## 2023-03-25 LAB — LIPASE: Lipase: 72 U/L — ABNORMAL HIGH (ref 11.0–59.0)

## 2023-03-25 LAB — HEMOGLOBIN A1C: Hgb A1c MFr Bld: 5.6 % (ref 4.6–6.5)

## 2023-03-25 MED ORDER — VALSARTAN 160 MG PO TABS
160.0000 mg | ORAL_TABLET | Freq: Two times a day (BID) | ORAL | 0 refills | Status: DC
Start: 2023-03-25 — End: 2023-07-09

## 2023-03-25 MED ORDER — TORSEMIDE 20 MG PO TABS: 20.0000 mg | ORAL_TABLET | Freq: Every day | ORAL | 0 refills | Status: DC

## 2023-03-25 NOTE — Patient Instructions (Signed)
 Hypertension, Adult High blood pressure (hypertension) is when the force of blood pumping through the arteries is too strong. The arteries are the blood vessels that carry blood from the heart throughout the body. Hypertension forces the heart to work harder to pump blood and may cause arteries to become narrow or stiff. Untreated or uncontrolled hypertension can lead to a heart attack, heart failure, a stroke, kidney disease, and other problems. A blood pressure reading consists of a higher number over a lower number. Ideally, your blood pressure should be below 120/80. The first ("top") number is called the systolic pressure. It is a measure of the pressure in your arteries as your heart beats. The second ("bottom") number is called the diastolic pressure. It is a measure of the pressure in your arteries as the heart relaxes. What are the causes? The exact cause of this condition is not known. There are some conditions that result in high blood pressure. What increases the risk? Certain factors may make you more likely to develop high blood pressure. Some of these risk factors are under your control, including: Smoking. Not getting enough exercise or physical activity. Being overweight. Having too much fat, sugar, calories, or salt (sodium) in your diet. Drinking too much alcohol. Other risk factors include: Having a personal history of heart disease, diabetes, high cholesterol, or kidney disease. Stress. Having a family history of high blood pressure and high cholesterol. Having obstructive sleep apnea. Age. The risk increases with age. What are the signs or symptoms? High blood pressure may not cause symptoms. Very high blood pressure (hypertensive crisis) may cause: Headache. Fast or irregular heartbeats (palpitations). Shortness of breath. Nosebleed. Nausea and vomiting. Vision changes. Severe chest pain, dizziness, and seizures. How is this diagnosed? This condition is diagnosed by  measuring your blood pressure while you are seated, with your arm resting on a flat surface, your legs uncrossed, and your feet flat on the floor. The cuff of the blood pressure monitor will be placed directly against the skin of your upper arm at the level of your heart. Blood pressure should be measured at least twice using the same arm. Certain conditions can cause a difference in blood pressure between your right and left arms. If you have a high blood pressure reading during one visit or you have normal blood pressure with other risk factors, you may be asked to: Return on a different day to have your blood pressure checked again. Monitor your blood pressure at home for 1 week or longer. If you are diagnosed with hypertension, you may have other blood or imaging tests to help your health care provider understand your overall risk for other conditions. How is this treated? This condition is treated by making healthy lifestyle changes, such as eating healthy foods, exercising more, and reducing your alcohol intake. You may be referred for counseling on a healthy diet and physical activity. Your health care provider may prescribe medicine if lifestyle changes are not enough to get your blood pressure under control and if: Your systolic blood pressure is above 130. Your diastolic blood pressure is above 80. Your personal target blood pressure may vary depending on your medical conditions, your age, and other factors. Follow these instructions at home: Eating and drinking  Eat a diet that is high in fiber and potassium, and low in sodium, added sugar, and fat. An example of this eating plan is called the DASH diet. DASH stands for Dietary Approaches to Stop Hypertension. To eat this way: Eat  plenty of fresh fruits and vegetables. Try to fill one half of your plate at each meal with fruits and vegetables. Eat whole grains, such as whole-wheat pasta, brown rice, or whole-grain bread. Fill about one  fourth of your plate with whole grains. Eat or drink low-fat dairy products, such as skim milk or low-fat yogurt. Avoid fatty cuts of meat, processed or cured meats, and poultry with skin. Fill about one fourth of your plate with lean proteins, such as fish, chicken without skin, beans, eggs, or tofu. Avoid pre-made and processed foods. These tend to be higher in sodium, added sugar, and fat. Reduce your daily sodium intake. Many people with hypertension should eat less than 1,500 mg of sodium a day. Do not drink alcohol if: Your health care provider tells you not to drink. You are pregnant, may be pregnant, or are planning to become pregnant. If you drink alcohol: Limit how much you have to: 0-1 drink a day for women. 0-2 drinks a day for men. Know how much alcohol is in your drink. In the [place] one drink equals one 12 oz bottle of beer (355 mL), one 5 oz glass of wine (148 mL), or one 1 oz glass of hard liquor (44 mL). Lifestyle  Work with your health care provider to maintain a healthy body weight or to lose weight. Ask what an ideal weight is for you. Get at least 30 minutes of exercise that causes your heart to beat faster (aerobic exercise) most days of the week. Activities may include walking, swimming, or biking. Include exercise to strengthen your muscles (resistance exercise), such as Pilates or lifting weights, as part of your weekly exercise routine. Try to do these types of exercises for 30 minutes at least 3 days a week. Do not use any products that contain nicotine or tobacco. These products include cigarettes, chewing tobacco, and vaping devices, such as e-cigarettes. If you need help quitting, ask your health care provider. Monitor your blood pressure at home as told by your health care provider. Keep all follow-up visits. This is important. Medicines Take over-the-counter and prescription medicines only as told by your health care provider. Follow directions carefully. Blood  pressure medicines must be taken as prescribed. Do not skip doses of blood pressure medicine. Doing this puts you at risk for problems and can make the medicine less effective. Ask your health care provider about side effects or reactions to medicines that you should watch for. Contact a health care provider if you: Think you are having a reaction to a medicine you are taking. Have headaches that keep coming back (recurring). Feel dizzy. Have swelling in your ankles. Have trouble with your vision. Get help right away if you: Develop a severe headache or confusion. Have unusual weakness or numbness. Feel faint. Have severe pain in your chest or abdomen. Vomit repeatedly. Have trouble breathing. These symptoms may be an emergency. Get help right away. Call 911. Do not wait to see if the symptoms will go away. Do not drive yourself to the hospital. Summary Hypertension is when the force of blood pumping through your arteries is too strong. If this condition is not controlled, it may put you at risk for serious complications. Your personal target blood pressure may vary depending on your medical conditions, your age, and other factors. For most people, a normal blood pressure is less than 120/80. Hypertension is treated with lifestyle changes, medicines, or a combination of both. Lifestyle changes include losing weight, eating a healthy,  low-sodium diet, exercising more, and limiting alcohol. This information is not intended to replace advice given to you by your health care provider. Make sure you discuss any questions you have with your health care provider. Document Revised: 11/12/2020 Document Reviewed: 11/12/2020 [employer] Patient Education  2024 [employer].

## 2023-03-25 NOTE — Progress Notes (Signed)
 Subjective:  Patient ID: Keith Bullock, male    DOB: Feb 29, 1960  Age: 63 y.o. MRN: [id number]  CC: Hypertension   HPI [name] presents for f/up -----  Discussed the use of AI scribe software for clinical note transcription with the patient, who gave verbal consent to proceed.  History of Present Illness   Keith Bullock "Keith Bullock" is a 63 year old male who presents with anxiety, memory issues, and tinnitus.  He has been experiencing anxiety and occasional depression, leading to decreased social engagement and libido, which he describes as 'about a zero.' These symptoms have been persistent and are impacting his daily life.  He is having memory issues, particularly with remembering dates and people's last names, which is unusual for him. He is unsure of the exact timing of his recent COVID and flu vaccinations, which is atypical for him.  He describes tinnitus as a 'ringing in my ears' that is progressively getting louder, especially bothersome in quiet environments. The sound is a 'real high frequency pitch' that sometimes pulses, making it difficult for him to sleep, although a sleep machine provides some relief. It does not significantly affect his hearing.  He monitors his blood pressure at home, which typically reads around 138/80 mmHg, but he experiences occasional dizziness or lightheadedness when standing up. He is currently taking blood pressure medication and Zetia for cholesterol management. He previously stopped taking naltrexone after seven weeks due to feeling unwell and experiencing joint aches similar to when he was on Lipitor. No chest pain or shortness of breath.  He has been engaging in more physical activity over the past two months, including heavy weight training and walking on the treadmill. He also walks while playing golf three times a week, feeling good during these activities with no chest pain, shortness of breath, dizziness, or lightheadedness. He  consumes alcohol, having gone several weeks without it, but currently limits his intake to two drinks a day at most.  No numbness, weakness, tingling, slurred speech, or trouble walking.       Outpatient Medications Prior to Visit  Medication Sig Dispense Refill   cetirizine (ZYRTEC) 10 MG tablet Take 10 mg by mouth daily as needed for allergies.     Famotidine-Ca Carb-Mag Hydrox (PEPCID COMPLETE PO) Take 1 tablet by mouth daily as needed (acid reflux).     Multiple Vitamin (MULTIVITAMIN WITH MINERALS) TABS tablet Take 1 tablet by mouth daily.     tadalafil (CIALIS) 5 MG tablet TAKE 1 TABLET (5 MG TOTAL) BY MOUTH DAILY. 90 tablet 1   thiamine (VITAMIN B-1) 50 MG tablet Take 1 tablet (50 mg total) by mouth daily. 90 tablet 1   ezetimibe (ZETIA) 10 MG tablet TAKE 1 TABLET (10 MG TOTAL) BY MOUTH DAILY AT BEDTIME 90 tablet 0   valsartan (DIOVAN) 160 MG tablet TAKE 1 TABLET (160 MG TOTAL) BY MOUTH DAILY. 90 tablet 0   naltrexone (DEPADE) 50 MG tablet Take 1 tablet (50 mg total) by mouth daily. 90 tablet 1   No facility-administered medications prior to visit.    ROS Review of Systems  Constitutional:  Positive for unexpected weight change (wt gain). Negative for appetite change.  HENT:  Positive for hearing loss and tinnitus. Negative for congestion, ear pain, nosebleeds and trouble swallowing.   Eyes: Negative.  Negative for visual disturbance.  Respiratory:  Negative for cough, chest tightness, shortness of breath and wheezing.   Cardiovascular:  Negative for chest pain, palpitations and leg swelling.  Gastrointestinal: Negative.  Negative for abdominal pain, constipation, diarrhea, nausea and vomiting.  Endocrine: Negative.   Genitourinary:  Positive for difficulty urinating. Negative for dysuria and hematuria.  Musculoskeletal:  Negative for arthralgias and myalgias.  Skin: Negative.   Neurological: Negative.  Negative for dizziness, weakness, light-headedness, numbness and headaches.   Hematological:  Negative for adenopathy. Does not bruise/bleed easily.  Psychiatric/Behavioral:  Positive for confusion and decreased concentration. Negative for agitation, behavioral problems, hallucinations and sleep disturbance. The patient is nervous/anxious. The patient is not hyperactive.     Objective:  BP (!) 162/78 (BP Location: Left Arm, Patient Position: Sitting, Cuff Size: Large)   Pulse 60   Temp 97.9 F (36.6 C) (Oral)   Resp 16   Ht 5\' 10"  (1.778 m)   Wt 208 lb 6.4 oz (94.5 kg)   SpO2 98%   BMI 29.90 kg/m   BP Readings from Last 3 Encounters:  03/25/23 (!) 162/78  07/20/22 138/86  04/20/22 (!) 142/88    Wt Readings from Last 3 Encounters:  03/25/23 208 lb 6.4 oz (94.5 kg)  07/20/22 196 lb (88.9 kg)  04/20/22 199 lb (90.3 kg)    Physical Exam Vitals reviewed.  Constitutional:      Appearance: Normal appearance.  HENT:     Right Ear: Hearing, tympanic membrane, ear canal and external ear normal.     Left Ear: Hearing, tympanic membrane, ear canal and external ear normal.     Mouth/Throat:     Mouth: Mucous membranes are moist.  Eyes:     General: No scleral icterus.    Extraocular Movements: Extraocular movements intact.     Conjunctiva/sclera: Conjunctivae normal.     Pupils: Pupils are equal, round, and reactive to light.  Cardiovascular:     Rate and Rhythm: Normal rate and regular rhythm.     Heart sounds: Normal heart sounds, S1 normal and S2 normal. No murmur heard.    No gallop.     Comments: EKG- NSR, 63 bpm No LVH, Q waves, or ST/T wave changes  Unchanged  Pulmonary:     Effort: Pulmonary effort is normal.     Breath sounds: No stridor. No wheezing, rhonchi or rales.  Abdominal:     General: Abdomen is flat.     Palpations: There is no mass.     Tenderness: There is no abdominal tenderness. There is no guarding.     Hernia: No hernia is present.  Musculoskeletal:     Cervical back: Neck supple.     Right lower leg: No edema.      Left lower leg: No edema.  Lymphadenopathy:     Cervical: No cervical adenopathy.  Skin:    General: Skin is warm and dry.  Neurological:     General: No focal deficit present.     Mental Status: He is alert. Mental status is at baseline.  Psychiatric:        Mood and Affect: Mood normal.        Behavior: Behavior normal.     Lab Results  Component Value Date   WBC 4.1 03/25/2023   HGB 14.0 03/25/2023   HCT 42.0 03/25/2023   PLT 188.0 03/25/2023   GLUCOSE 115 (H) 03/25/2023   CHOL 127 03/25/2023   TRIG 94.0 03/25/2023   HDL 42.80 03/25/2023   LDLCALC 65 03/25/2023   ALT 38 03/25/2023   AST 39 (H) 03/25/2023   NA 133 (L) 03/25/2023   K 4.4 03/25/2023   CL  99 03/25/2023   CREATININE 1.17 03/25/2023   BUN 20 03/25/2023   CO2 28 03/25/2023   TSH 2.98 03/25/2023   PSA 4.06 (H) 03/25/2023   INR 1.2 (H) 03/25/2023   HGBA1C 5.6 03/25/2023    MR Abdomen W Wo Contrast Result Date: 04/13/2022 CLINICAL DATA:  Follow-up of pancreatic cystic lesions. EXAM: MRI ABDOMEN WITHOUT AND WITH CONTRAST TECHNIQUE: Multiplanar multisequence MR imaging of the abdomen was performed both before and after the administration of intravenous contrast. CONTRAST:  9 cc Vueway COMPARISON:  04/20/2021 FINDINGS: Lower chest: Normal heart size without pericardial or pleural effusion. Hepatobiliary: Again identified are multiple simple hepatic cysts, the largest of which is in the hepatic dome at 4.7 cm. No suspicious liver lesion or biliary abnormality. Pancreas: No pancreatic duct dilatation. Again identified are cystic lesions throughout the pancreas. The largest is within the pancreatic neck/body junction at 1.3 x 1.6 cm on 21/4. Compare 1.8 x 1.7 cm on the prior exam when measured in a similar fashion. 1.6 cm craniocaudal on 29/3 today versus similar on the prior exam (when remeasured). Similar in morphology, with at least 1 thin septation within. Pancreatic tail 9 mm cystic lesion on 21/4 and head lesion on  27/16 at 8 mm are also not significantly changed. No definite communication with the pancreatic duct of any of these lesions. No post-contrast worrisome characteristics. Spleen:  Normal in size, without focal abnormality. Adrenals/Urinary Tract: Normal adrenal glands. Bilateral too small to characterize renal lesions are most likely cysts . In the absence of clinically indicated signs/symptoms require(s) no independent follow-up. No hydronephrosis. Stomach/Bowel: Normal stomach and abdominal bowel loops. Vascular/Lymphatic: Aortic atherosclerosis. No retroperitoneal or retrocrural adenopathy. Other:  No ascites. Musculoskeletal: No acute osseous abnormality. IMPRESSION: 1. Multiple simple cystic lesions throughout the pancreas, primarily similar in size. The dominant neck/body junction lesion is similar to slightly decreased in size. Differential considerations remain pseudocyst or indolent cystic neoplasm. Per consensus criteria, this warrants follow-up with pre and post contrast abdominal MRI at 1 year. 2.  No acute abdominal process. 3.  Aortic Atherosclerosis (ICD10-I70.0). Electronically Signed   By: Jeronimo Greaves M.D.   On: 04/13/2022 10:27    Assessment & Plan:  Pancreatic cyst -     Lipase; Future -     MR ABDOMEN W WO CONTRAST; Future  Primary hypertension- BP is not at goal. Na+ is low. Will add a loop diuretic and increase the ARB dose. -     EKG 12-Lead -     TSH; Future -     Urinalysis, Routine w reflex microscopic; Future -     CBC with Differential/Platelet; Future -     Basic metabolic panel; Future -     Torsemide; Take 1 tablet (20 mg total) by mouth daily.  Dispense: 90 tablet; Refill: 0 -     Valsartan; Take 1 tablet (160 mg total) by mouth 2 (two) times daily.  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) -     CBC with Differential/Platelet; Future  Hypertriglyceridemia -     Lipid  panel; Future  Mixed hyperlipidemia -     Lipid panel; Future  Hemochromatosis, unspecified hemochromatosis type -     IBC + Ferritin; Future -     CBC with Differential/Platelet; Future  Iron overload -     IBC + Ferritin; Future -     CBC with Differential/Platelet; Future  Abnormal glucose level -     Hemoglobin A1c; Future  Memory impairment of gradual onset -     MR BRAIN WO CONTRAST; Future  Tinnitus aurium, bilateral -     Ambulatory referral to ENT  Elevated lipase -     MR ABDOMEN W WO CONTRAST; Future     Follow-up: Return in about 4 weeks (around 04/22/2023).  Sanda Linger, MD

## 2023-03-26 ENCOUNTER — Other Ambulatory Visit: Payer: Self-pay | Admitting: Internal Medicine

## 2023-03-26 DIAGNOSIS — R748 Abnormal levels of other serum enzymes: Secondary | ICD-10-CM | POA: Insufficient documentation

## 2023-03-26 DIAGNOSIS — E785 Hyperlipidemia, unspecified: Secondary | ICD-10-CM

## 2023-03-26 MED ORDER — VITAMIN B-1 50 MG PO TABS: 50.0000 mg | ORAL_TABLET | Freq: Every day | ORAL | 1 refills | Status: DC

## 2023-03-27 ENCOUNTER — Encounter: Payer: Self-pay | Admitting: Internal Medicine

## 2023-04-15 ENCOUNTER — Ambulatory Visit
Admission: RE | Admit: 2023-04-15 | Discharge: 2023-04-15 | Disposition: A | Discharge status: Home / Self Care | Source: Ambulatory Visit | Attending: Internal Medicine | Admitting: Internal Medicine

## 2023-04-15 ENCOUNTER — Encounter (INDEPENDENT_AMBULATORY_CARE_PROVIDER_SITE_OTHER): Payer: Self-pay

## 2023-04-15 DIAGNOSIS — K862 Cyst of pancreas: Secondary | ICD-10-CM

## 2023-04-15 DIAGNOSIS — R748 Abnormal levels of other serum enzymes: Secondary | ICD-10-CM

## 2023-04-15 DIAGNOSIS — I700 Atherosclerosis of aorta: Secondary | ICD-10-CM | POA: Diagnosis not present

## 2023-04-15 DIAGNOSIS — R270 Ataxia, unspecified: Secondary | ICD-10-CM | POA: Diagnosis not present

## 2023-04-15 DIAGNOSIS — R413 Other amnesia: Secondary | ICD-10-CM

## 2023-04-15 DIAGNOSIS — K760 Fatty (change of) liver, not elsewhere classified: Secondary | ICD-10-CM | POA: Diagnosis not present

## 2023-04-15 MED ORDER — GADOPICLENOL 0.5 MMOL/ML IV SOLN
10.0000 mL | Freq: Once | INTRAVENOUS | Status: AC | PRN
  Administered 2023-04-15: 10 mL via INTRAVENOUS

## 2023-04-16 ENCOUNTER — Ambulatory Visit (INDEPENDENT_AMBULATORY_CARE_PROVIDER_SITE_OTHER): Admitting: Physician Assistant

## 2023-04-16 ENCOUNTER — Encounter (INDEPENDENT_AMBULATORY_CARE_PROVIDER_SITE_OTHER): Payer: Self-pay

## 2023-04-16 ENCOUNTER — Ambulatory Visit (INDEPENDENT_AMBULATORY_CARE_PROVIDER_SITE_OTHER): Admitting: Audiology

## 2023-04-16 DIAGNOSIS — H9313 Tinnitus, bilateral: Secondary | ICD-10-CM

## 2023-04-16 DIAGNOSIS — H903 Sensorineural hearing loss, bilateral: Secondary | ICD-10-CM

## 2023-04-16 DIAGNOSIS — H9319 Tinnitus, unspecified ear: Secondary | ICD-10-CM

## 2023-04-16 NOTE — Progress Notes (Signed)
 Dear Dr. Yetta Barre, Here is my assessment for our mutual patient, Keith Bullock. Thank you for allowing me the opportunity to care for your patient. Please do not hesitate to contact me should you have any other questions. Sincerely, Burna Forts PA-C  Otolaryngology Clinic Note Referring provider: Dr. Yetta Barre HPI:  [name] is a 63 y.o. male kindly referred by Dr. Yetta Barre   The patient is a 63 year old gentleman seen our office for evaluation of tinnitus.  The patient notes that approximate 6 months ago he started developing ringing in bilateral ears.  He notes this is high-pitched and fairly constant.  He notes there are days when he does not have symptoms but most days they are consistently throughout the day.  He notes it is in both ears, the right seems more prominent when compared to the left.  He denies any exacerbating factors.  He denies any associated symptoms including any neurologic complaints, pain.  He denies any preceding noise exposure or head or neck trauma.  No new medications.  He notes that sometimes it does keep him up at night, it seems that a noise machine does help with the symptoms.  He has a history of some memory issues and had a recent MRI which the results are not available for review.    Independent Review of Additional Tests or Records:   Tympanometry: Right ear: Type A- Normal external ear canal volume with normal middle ear pressure and tympanic membrane compliance Left ear: Type A- Normal external ear canal volume with normal middle ear pressure and tympanic membrane compliance   Distortion Product Otoacoustic Emissions: Equipment not available.     Pure tone Audiometry: Right ear- Normal hearing from [phone] Hz, except for a mild presumably sensorineural hearing loss at 6000 Hz. Left ear-  Normal hearing from [phone], then mild presumable sensorineural hearing loss from 6000-8000 Hz   Speech Audiometry: Right ear- Speech Reception Threshold (SRT) was  obtained at 15 dBHL. Left ear-Speech Reception Threshold (SRT) was obtained at 15 dBHL.   Word Recognition Score Tested using NU-6 (MLV) Right ear: 100% was obtained at a presentation level of 55 dBHL with contralateral masking which is deemed as  excellent. Left ear: 100% was obtained at a presentation level of 55 dBHL with contralateral masking which is deemed as  excellent.   The hearing test results were completed under headphones and results are deemed to be of good reliability. Test technique:  conventional      PMH/Meds/All/SocHx/FamHx/ROS:   Past Medical History:  Diagnosis Date   Family history of colon cancer 10/18/2020   Family history of ovarian cancer 10/18/2020   Hypertension    MRSA cellulitis      Past Surgical History:  Procedure Laterality Date   INSERTION OF MESH N/A 02/23/2017   Procedure: INSERTION OF MESH;  Surgeon: Axel Filler, MD;  Location: [facility] OR;  Service: General;  Laterality: N/A;   ROTATOR CUFF REPAIR Left    UMBILICAL HERNIA REPAIR N/A 02/23/2017   Procedure: LAPAROSCOPIC UMBILICAL HERNIA REPAIR WITH MESH;  Surgeon: Axel Filler, MD;  Location: [facility] OR;  Service: General;  Laterality: N/A;    Family History  Problem Relation Age of Onset   Ovarian cancer Mother 60   Depression Mother    Hypertension Mother    Colon cancer Father 62   Stomach cancer Paternal Aunt        d. 25   Diabetes Paternal Grandfather      Social Connections: Moderately Isolated (03/22/2023)  Social Connection and Isolation Panel [NHANES]    Frequency of Communication with Friends and Family: Once a week    Frequency of Social Gatherings with Friends and Family: Three times a week    Attends Religious Services: Never    Active Member of Clubs or Organizations: No    Attends Engineer, structural: Not on file    Marital Status: Married      Current Outpatient Medications:    cetirizine (ZYRTEC) 10 MG tablet, Take 10 mg by mouth daily as needed for  allergies., Disp: , Rfl:    ezetimibe (ZETIA) 10 MG tablet, TAKE 1 TABLET (10 MG TOTAL) BY MOUTH DAILY AT BEDTIME, Disp: 90 tablet, Rfl: 1   Famotidine-Ca Carb-Mag Hydrox (PEPCID COMPLETE PO), Take 1 tablet by mouth daily as needed (acid reflux)., Disp: , Rfl:    Multiple Vitamin (MULTIVITAMIN WITH MINERALS) TABS tablet, Take 1 tablet by mouth daily., Disp: , Rfl:    tadalafil (CIALIS) 5 MG tablet, TAKE 1 TABLET (5 MG TOTAL) BY MOUTH DAILY., Disp: 90 tablet, Rfl: 1   thiamine (VITAMIN B-1) 50 MG tablet, Take 1 tablet (50 mg total) by mouth daily., Disp: 90 tablet, Rfl: 1   torsemide (DEMADEX) 20 MG tablet, Take 1 tablet (20 mg total) by mouth daily., Disp: 90 tablet, Rfl: 0   valsartan (DIOVAN) 160 MG tablet, Take 1 tablet (160 mg total) by mouth 2 (two) times daily., Disp: 180 tablet, Rfl: 0   Physical Exam:   There were no vitals taken for this visit.  Pertinent Findings  CN II-XII intact  Bilateral EAC clear and TM intact with well pneumatized middle ear spaces Anterior rhinoscopy: Septum left deviation ; bilateral inferior turbinates with no hypertrophy No lesions of oral cavity/oropharynx; dentition wnl No obviously palpable neck masses/lymphadenopathy/thyromegaly No respiratory distress or stridor   Seprately Identifiable Procedures:  None  Impression & Plans:  [name] is a 63 y.o. male with the following   Tinnitus-  The patient presents with tinnitus.  He has no red flags on exam.  He has no concerning signs for vascular etiology.  His hearing evaluation was reviewed he does have mild sensorineural hearing loss bilateral at 6000 Hz.  I discussed continued use of white noise as this seems to help.  Have also offered referral to the [facility] tinnitus clinic.  The patient notes that this time he will continue to monitor his symptoms and if they become too bothersome he will follow-up with the tinnitus clinic.  I did give him strict return precautions event he develops any new or  worsening signs or symptoms.  He verbalized understanding and agreement to today's plan had no further questions or concerns.   - f/u PRN   Thank you for allowing me the opportunity to care for your patient. Please do not hesitate to contact me should you have any other questions.  Sincerely, Burna Forts PA-C [facility] Phone: [phone] Fax: [phone]  04/16/2023, 9:14 AM

## 2023-04-16 NOTE — Progress Notes (Signed)
  [address], [address] [place], [place] [address] [phone]  Audiological Evaluation    Name: Keith Bullock     DOB:   24-Dec-1960      MRN:   [id number]                                                                                     Service Date: 04/16/2023     Accompanied by: unaccompanied   Patient comes today after Eyvonne Mechanic, PA-C sent a referral for a hearing evaluation due to concerns with tinnitus.   Symptoms Yes Details  Hearing loss  [x]  Reports difficulty hearing others in presence of noise. 05-01-2022: Pure tone thresholds show normal hering in each ear.   Tinnitus  [x]  Both ears - onset 6 months ago - reported to be bothersome and may be worse in the right ear. He says his wife says it may be due to stress. Says today is loud.  Ear pain/ infections/pressure  []    Balance problems  []    Noise exposure history  []    Previous ear surgeries  []    Family history of hearing loss  [x]  Uncle with age  Amplification  []    Other  []      Otoscopy: Right ear: Clear external ear canals and notable landmarks visualized on the tympanic membrane. Left ear:  Clear external ear canals and notable landmarks visualized on the tympanic membrane.  Tympanometry: Right ear: Type A- Normal external ear canal volume with normal middle ear pressure and tympanic membrane compliance Left ear: Type A- Normal external ear canal volume with normal middle ear pressure and tympanic membrane compliance  Distortion Product Otoacoustic Emissions: Equipment not available.    Pure tone Audiometry: Right ear- Normal hearing from [phone] Hz, except for a mild presumably sensorineural hearing loss at 6000 Hz. Left ear-  Normal hearing from [phone], then mild presumable sensorineural hearing loss from 6000-8000 Hz  Speech Audiometry: Right ear- Speech Reception Threshold (SRT) was obtained at 15 dBHL. Left ear-Speech Reception Threshold (SRT) was obtained at 15 dBHL.   Word  Recognition Score Tested using NU-6 (MLV) Right ear: 100% was obtained at a presentation level of 55 dBHL with contralateral masking which is deemed as  excellent. Left ear: 100% was obtained at a presentation level of 55 dBHL with contralateral masking which is deemed as  excellent.   The hearing test results were completed under headphones and results are deemed to be of good reliability. Test technique:  conventional     Recommendations: Follow up with ENT as scheduled for today. Return for a hearing evaluation if concerns with hearing changes arise or per MD recommendation., Consider various tinnitus strategies, including the use of a sound generator, hearing aids, and/or tinnitus retraining therapy. (Patient may consider going to [facility] -tinnitus clinic).    Xiamara Hulet MARIE LEROUX-MARTINEZ, AUD

## 2023-04-16 NOTE — Patient Instructions (Addendum)
 [facility], [address]  [address]  [place] [address]  map, [phone]  Fax [phone], Mon-Fri

## 2023-04-22 ENCOUNTER — Encounter: Payer: Self-pay | Admitting: Internal Medicine

## 2023-05-06 ENCOUNTER — Ambulatory Visit (INDEPENDENT_AMBULATORY_CARE_PROVIDER_SITE_OTHER): Admitting: Internal Medicine

## 2023-05-06 ENCOUNTER — Encounter: Payer: Self-pay | Admitting: Internal Medicine

## 2023-05-06 VITALS — BP 126/86 | HR 83 | Temp 98.1°F | Ht 70.0 in | Wt 206.8 lb

## 2023-05-06 DIAGNOSIS — H9313 Tinnitus, bilateral: Secondary | ICD-10-CM

## 2023-05-06 DIAGNOSIS — I10 Essential (primary) hypertension: Secondary | ICD-10-CM

## 2023-05-06 DIAGNOSIS — R413 Other amnesia: Secondary | ICD-10-CM

## 2023-05-06 NOTE — Progress Notes (Unsigned)
 Subjective:  Patient ID: Keith Bullock, male    DOB: 16-Sep-1960  Age: 63 y.o. MRN: [id number]  CC: Hypertension   HPI Keith Bullock presents for f/up ----  Discussed the use of AI scribe software for clinical note transcription with the patient, who gave verbal consent to proceed.  History of Present Illness   Keith Bullock is a 63 year old male who presents with persistent ear ringing affecting his sleep.  He has been experiencing ear ringing for almost three weeks, which is particularly noticeable in quiet environments. He has purchased low profile Bluetooth hearing plugs that play soft music to help drown out the ringing after seeing ENT.  The ear ringing is impacting his sleep, as he sometimes sleeps for four to five hours, wakes up for an hour, and then returns to sleep for a couple more hours. He has not tried any medications like melatonin or Benadryl to aid his sleep.  No dizziness or lightheadedness. He is currently taking blood pressure medication.       Outpatient Medications Prior to Visit  Medication Sig Dispense Refill   cetirizine (ZYRTEC) 10 MG tablet Take 10 mg by mouth daily as needed for allergies.     ezetimibe  (ZETIA ) 10 MG tablet TAKE 1 TABLET (10 MG TOTAL) BY MOUTH DAILY AT BEDTIME 90 tablet 1   Famotidine-Ca Carb-Mag Hydrox (PEPCID COMPLETE PO) Take 1 tablet by mouth daily as needed (acid reflux).     Multiple Vitamin (MULTIVITAMIN WITH MINERALS) TABS tablet Take 1 tablet by mouth daily.     tadalafil  (CIALIS ) 5 MG tablet TAKE 1 TABLET (5 MG TOTAL) BY MOUTH DAILY. 90 tablet 1   thiamine  (VITAMIN B-1) 50 MG tablet Take 1 tablet (50 mg total) by mouth daily. 90 tablet 1   torsemide  (DEMADEX ) 20 MG tablet Take 1 tablet (20 mg total) by mouth daily. 90 tablet 0   valsartan  (DIOVAN ) 160 MG tablet Take 1 tablet (160 mg total) by mouth 2 (two) times daily. 180 tablet 0   No facility-administered medications prior to visit.    ROS Review of  Systems  Objective:  BP 126/86 (BP Location: Left Arm, Patient Position: Sitting, Cuff Size: Normal)   Pulse 83   Temp 98.1 F (36.7 C) (Oral)   Ht 5\' 10"  (1.778 m)   Wt 206 lb 12.8 oz (93.8 kg)   SpO2 97%   BMI 29.67 kg/m   BP Readings from Last 3 Encounters:  05/06/23 126/86  03/25/23 (!) 162/78  07/20/22 138/86    Wt Readings from Last 3 Encounters:  05/06/23 206 lb 12.8 oz (93.8 kg)  03/25/23 208 lb 6.4 oz (94.5 kg)  07/20/22 196 lb (88.9 kg)    Physical Exam  Lab Results  Component Value Date   WBC 4.1 03/25/2023   HGB 14.0 03/25/2023   HCT 42.0 03/25/2023   PLT 188.0 03/25/2023   GLUCOSE 115 (H) 03/25/2023   CHOL 127 03/25/2023   TRIG 94.0 03/25/2023   HDL 42.80 03/25/2023   LDLCALC 65 03/25/2023   ALT 38 03/25/2023   AST 39 (H) 03/25/2023   NA 133 (L) 03/25/2023   K 4.4 03/25/2023   CL 99 03/25/2023   CREATININE 1.17 03/25/2023   BUN 20 03/25/2023   CO2 28 03/25/2023   TSH 2.98 03/25/2023   PSA 4.06 (H) 03/25/2023   INR 1.2 (H) 03/25/2023   HGBA1C 5.6 03/25/2023    MR Brain Wo Contrast Result Date: 05/11/2023 CLINICAL DATA:  Ataxia, memory impairment EXAM: MRI HEAD WITHOUT CONTRAST TECHNIQUE: Multiplanar, multiecho pulse sequences of the brain and surrounding structures were obtained without intravenous contrast. COMPARISON:  None Available. FINDINGS: Brain: No abnormal signal on diffusion weighted images to suggest acute infarction. The susceptibility weighted sequences reveal no evidence of acute or chronic hemorrhage. The ventricles are normal in size and configuration without evidence of hydrocephalus. The pituitary and sella are normal. Vascular: Normal flow voids. Skull and upper cervical spine: Normal marrow signal. Sinuses/Orbits: Negative. Other: None. IMPRESSION: No acute intracranial abnormality. No specific findings to explain the patient's ataxia or memory impairment. Electronically Signed   By: Johnanna Mylar M.D.   On: 05/11/2023 10:43   MR  Abdomen W Wo Contrast Result Date: 04/22/2023 CLINICAL DATA:  Elevated lipase, pancreatic cystic lesions EXAM: MRI ABDOMEN WITHOUT AND WITH CONTRAST TECHNIQUE: Multiplanar multisequence MR imaging of the abdomen was performed both before and after the administration of intravenous contrast. CONTRAST:  10 mL Vueway  gadolinium contrast IV COMPARISON:  04/11/2022, 04/18/2021 FINDINGS: Lower chest: No acute abnormality. Hepatobiliary: No solid liver abnormality is seen. Numerous small simple appearing fluid signal cysts scattered throughout the liver, benign, requiring no further follow-up or characterization. Mild hepatic steatosis. No gallstones, gallbladder wall thickening, or biliary dilatation. Pancreas: Multiple small simple and thinly septated fluid signal cystic lesions are again seen scattered throughout the pancreas. Largest lesion in the ventral pancreatic neck not significantly changed, measuring 1.5 x 1.4 cm, additional subcentimeter lesions unchanged (series 5, image 23). No solid component or suspicious contrast enhancement. No pancreatic ductal dilatation or surrounding inflammatory changes. Spleen: Normal in size without significant abnormality. Adrenals/Urinary Tract: Adrenal glands are unremarkable. Kidneys are normal, without renal calculi, solid lesion, or hydronephrosis. Stomach/Bowel: Stomach is within normal limits. No evidence of bowel wall thickening, distention, or inflammatory changes. Vascular/Lymphatic: Aortic atherosclerosis. No enlarged abdominal lymph nodes. Other: No abdominal wall hernia or abnormality. No ascites. Musculoskeletal: No acute or significant osseous findings. IMPRESSION: 1. Multiple small simple and thinly septated fluid signal cystic lesions are again seen scattered throughout the pancreas not significantly changed. Largest lesion in the ventral pancreatic neck measures 1.5 x 1.4 cm. No solid component or suspicious contrast enhancement. No pancreatic ductal dilatation  or surrounding inflammatory changes. These are most consistent with small side branch IPMNs and almost certainly benign given established imaging stability. Additional follow-up can be considered in 2 years to ensure continued long-term indolent behavior. 2. Mild hepatic steatosis. Aortic Atherosclerosis (ICD10-I70.0). Electronically Signed   By: Fredricka Jenny M.D.   On: 04/22/2023 07:28     Assessment & Plan:  Primary hypertension     Follow-up: No follow-ups on file.  Sandra Crouch, MD

## 2023-05-11 ENCOUNTER — Encounter: Payer: Self-pay | Admitting: Internal Medicine

## 2023-05-13 ENCOUNTER — Encounter: Payer: Self-pay | Admitting: Internal Medicine

## 2023-05-18 ENCOUNTER — Other Ambulatory Visit: Payer: Self-pay | Admitting: Internal Medicine

## 2023-05-20 ENCOUNTER — Other Ambulatory Visit: Payer: Self-pay | Admitting: Internal Medicine

## 2023-06-28 ENCOUNTER — Other Ambulatory Visit: Payer: Self-pay | Admitting: Internal Medicine

## 2023-06-28 DIAGNOSIS — I10 Essential (primary) hypertension: Secondary | ICD-10-CM

## 2023-07-08 ENCOUNTER — Other Ambulatory Visit: Payer: Self-pay | Admitting: Internal Medicine

## 2023-07-08 DIAGNOSIS — I10 Essential (primary) hypertension: Secondary | ICD-10-CM

## 2023-07-08 DIAGNOSIS — N401 Enlarged prostate with lower urinary tract symptoms: Secondary | ICD-10-CM

## 2023-07-12 DIAGNOSIS — M25511 Pain in right shoulder: Secondary | ICD-10-CM | POA: Diagnosis not present

## 2023-07-12 DIAGNOSIS — M25512 Pain in left shoulder: Secondary | ICD-10-CM | POA: Diagnosis not present

## 2023-07-15 DIAGNOSIS — M25511 Pain in right shoulder: Secondary | ICD-10-CM | POA: Diagnosis not present

## 2023-07-26 DIAGNOSIS — M67912 Unspecified disorder of synovium and tendon, left shoulder: Secondary | ICD-10-CM | POA: Diagnosis not present

## 2023-07-26 DIAGNOSIS — M75121 Complete rotator cuff tear or rupture of right shoulder, not specified as traumatic: Secondary | ICD-10-CM | POA: Diagnosis not present

## 2023-07-29 DIAGNOSIS — L814 Other melanin hyperpigmentation: Secondary | ICD-10-CM | POA: Diagnosis not present

## 2023-07-29 DIAGNOSIS — D225 Melanocytic nevi of trunk: Secondary | ICD-10-CM | POA: Diagnosis not present

## 2023-07-29 DIAGNOSIS — L821 Other seborrheic keratosis: Secondary | ICD-10-CM | POA: Diagnosis not present

## 2023-07-29 DIAGNOSIS — L578 Other skin changes due to chronic exposure to nonionizing radiation: Secondary | ICD-10-CM | POA: Diagnosis not present

## 2023-08-17 DIAGNOSIS — M25512 Pain in left shoulder: Secondary | ICD-10-CM | POA: Diagnosis not present

## 2023-08-17 DIAGNOSIS — R531 Weakness: Secondary | ICD-10-CM | POA: Diagnosis not present

## 2023-08-17 DIAGNOSIS — M25612 Stiffness of left shoulder, not elsewhere classified: Secondary | ICD-10-CM | POA: Diagnosis not present

## 2023-08-18 DIAGNOSIS — R531 Weakness: Secondary | ICD-10-CM | POA: Diagnosis not present

## 2023-08-18 DIAGNOSIS — M25512 Pain in left shoulder: Secondary | ICD-10-CM | POA: Diagnosis not present

## 2023-08-18 DIAGNOSIS — M25612 Stiffness of left shoulder, not elsewhere classified: Secondary | ICD-10-CM | POA: Diagnosis not present

## 2023-08-24 DIAGNOSIS — M25512 Pain in left shoulder: Secondary | ICD-10-CM | POA: Diagnosis not present

## 2023-08-24 DIAGNOSIS — R531 Weakness: Secondary | ICD-10-CM | POA: Diagnosis not present

## 2023-08-24 DIAGNOSIS — M25612 Stiffness of left shoulder, not elsewhere classified: Secondary | ICD-10-CM | POA: Diagnosis not present

## 2023-08-25 IMAGING — DX DG LUMBAR SPINE 2-3V
3 series · 3 of 3 positions shown · non-contrast
Comparison: None.

CLINICAL DATA: Lumbar spine pain. Low back pain radiating into the
right lateral hip for 5 months after playing golf.

EXAM:
LUMBAR SPINE - 2-3 VIEW

[l-spine ap]
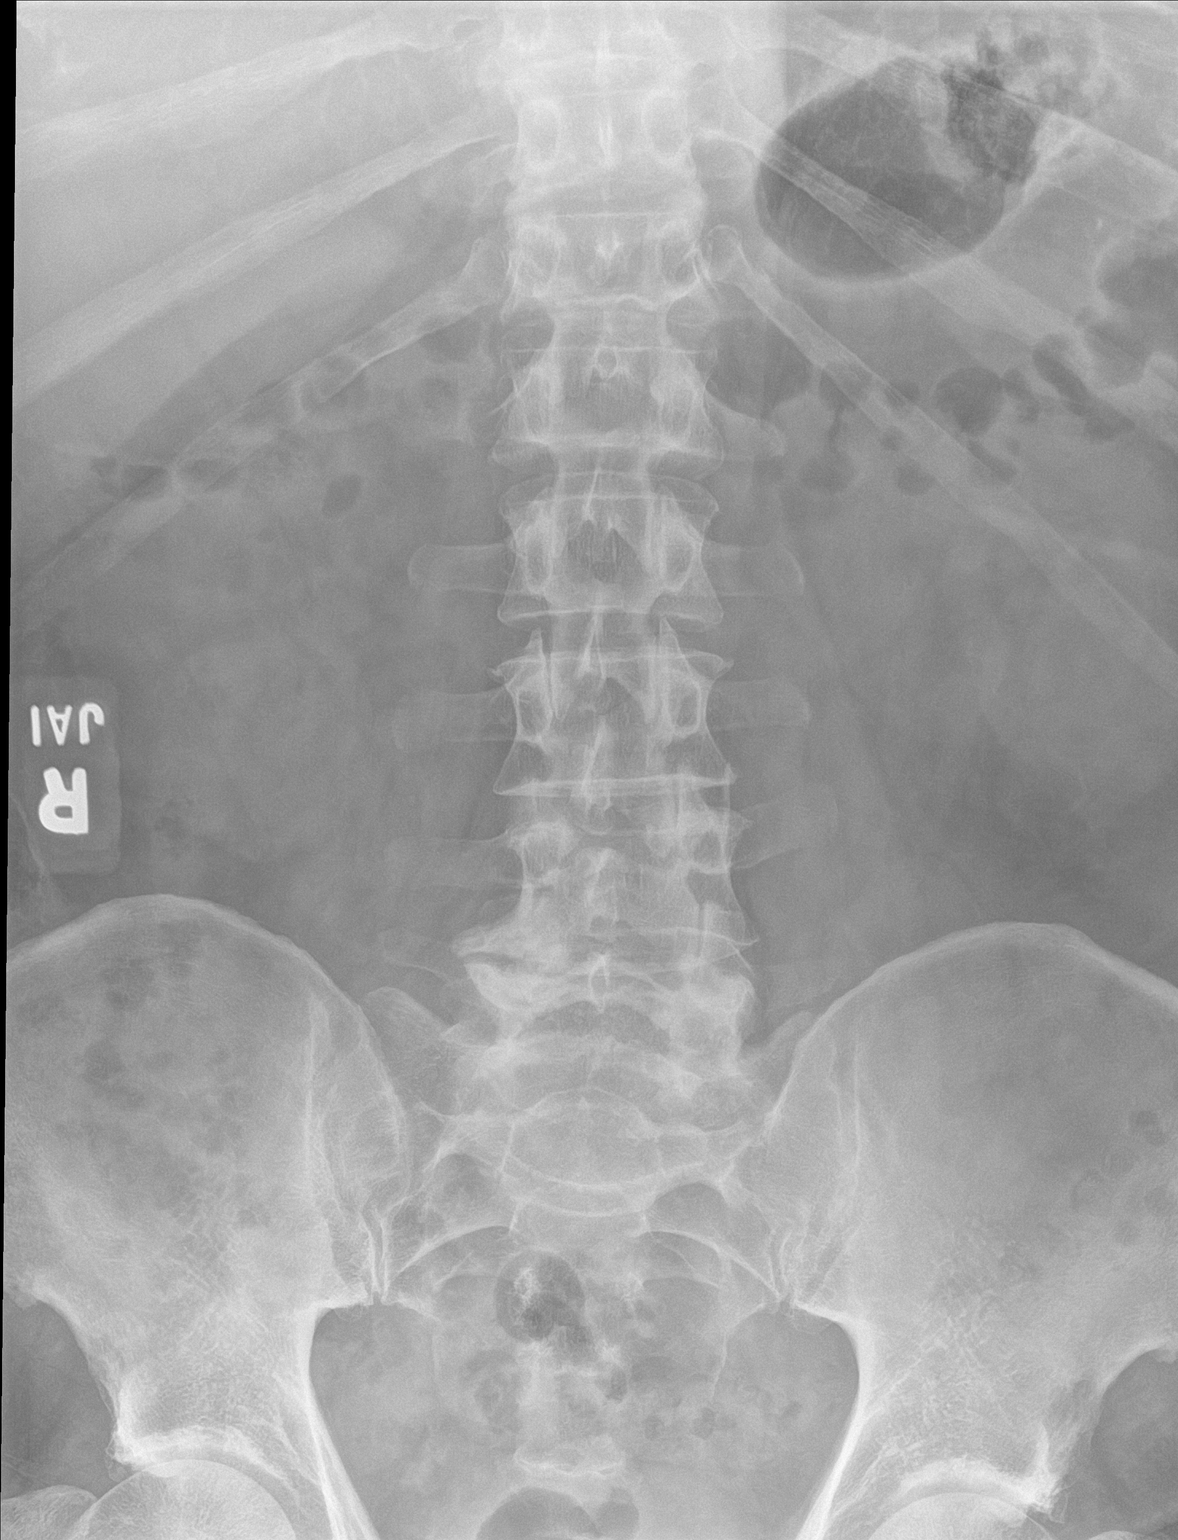

[l-spine lateral]
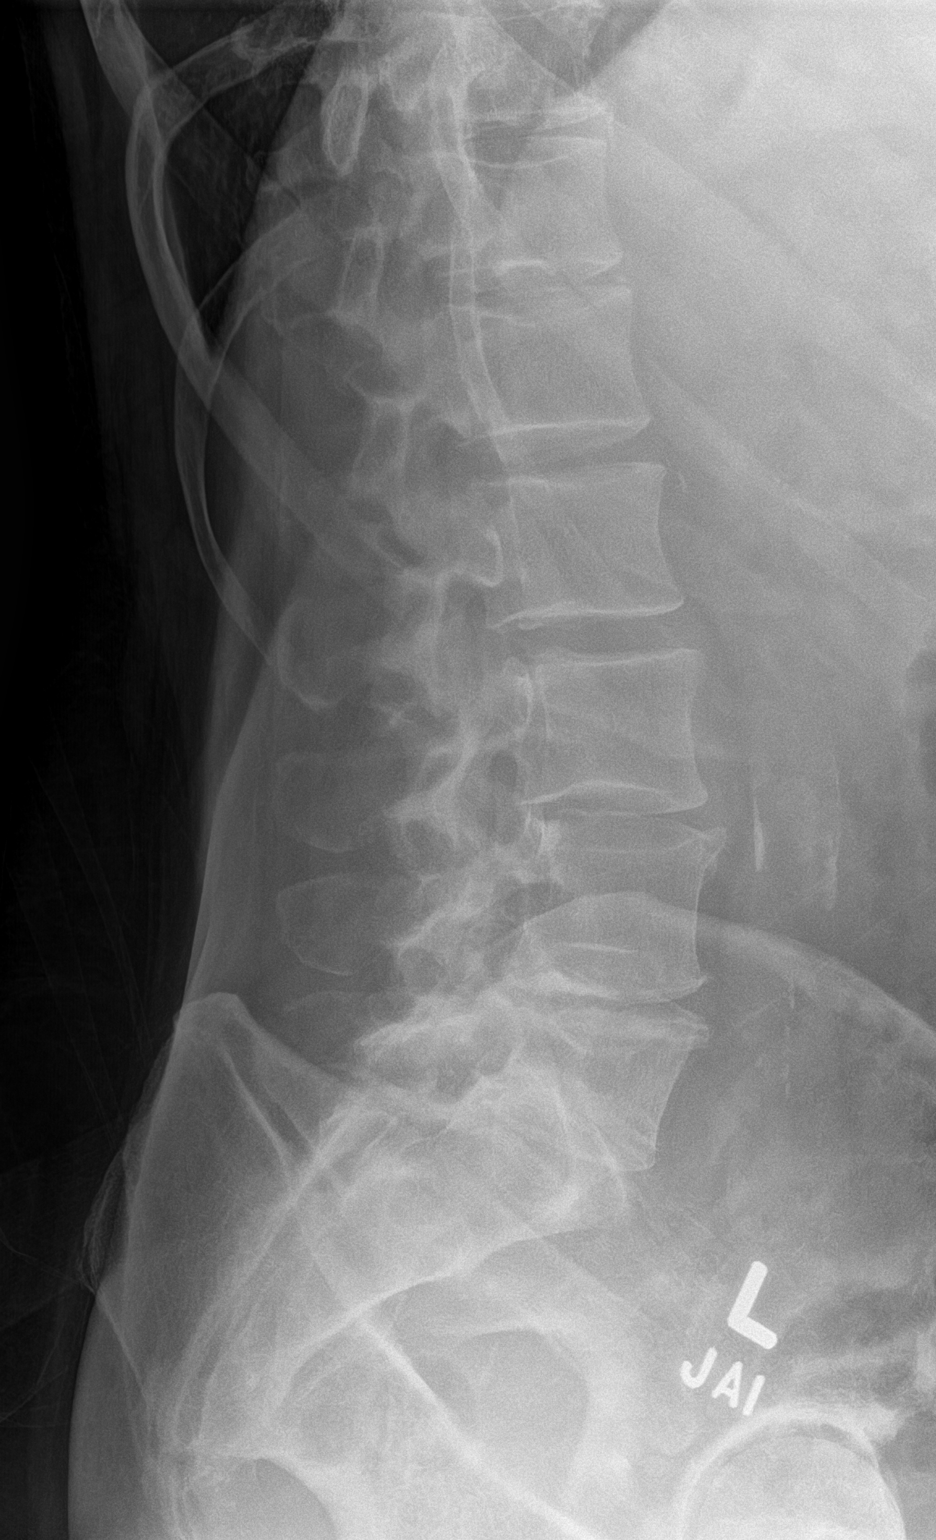

[l-spine spot]
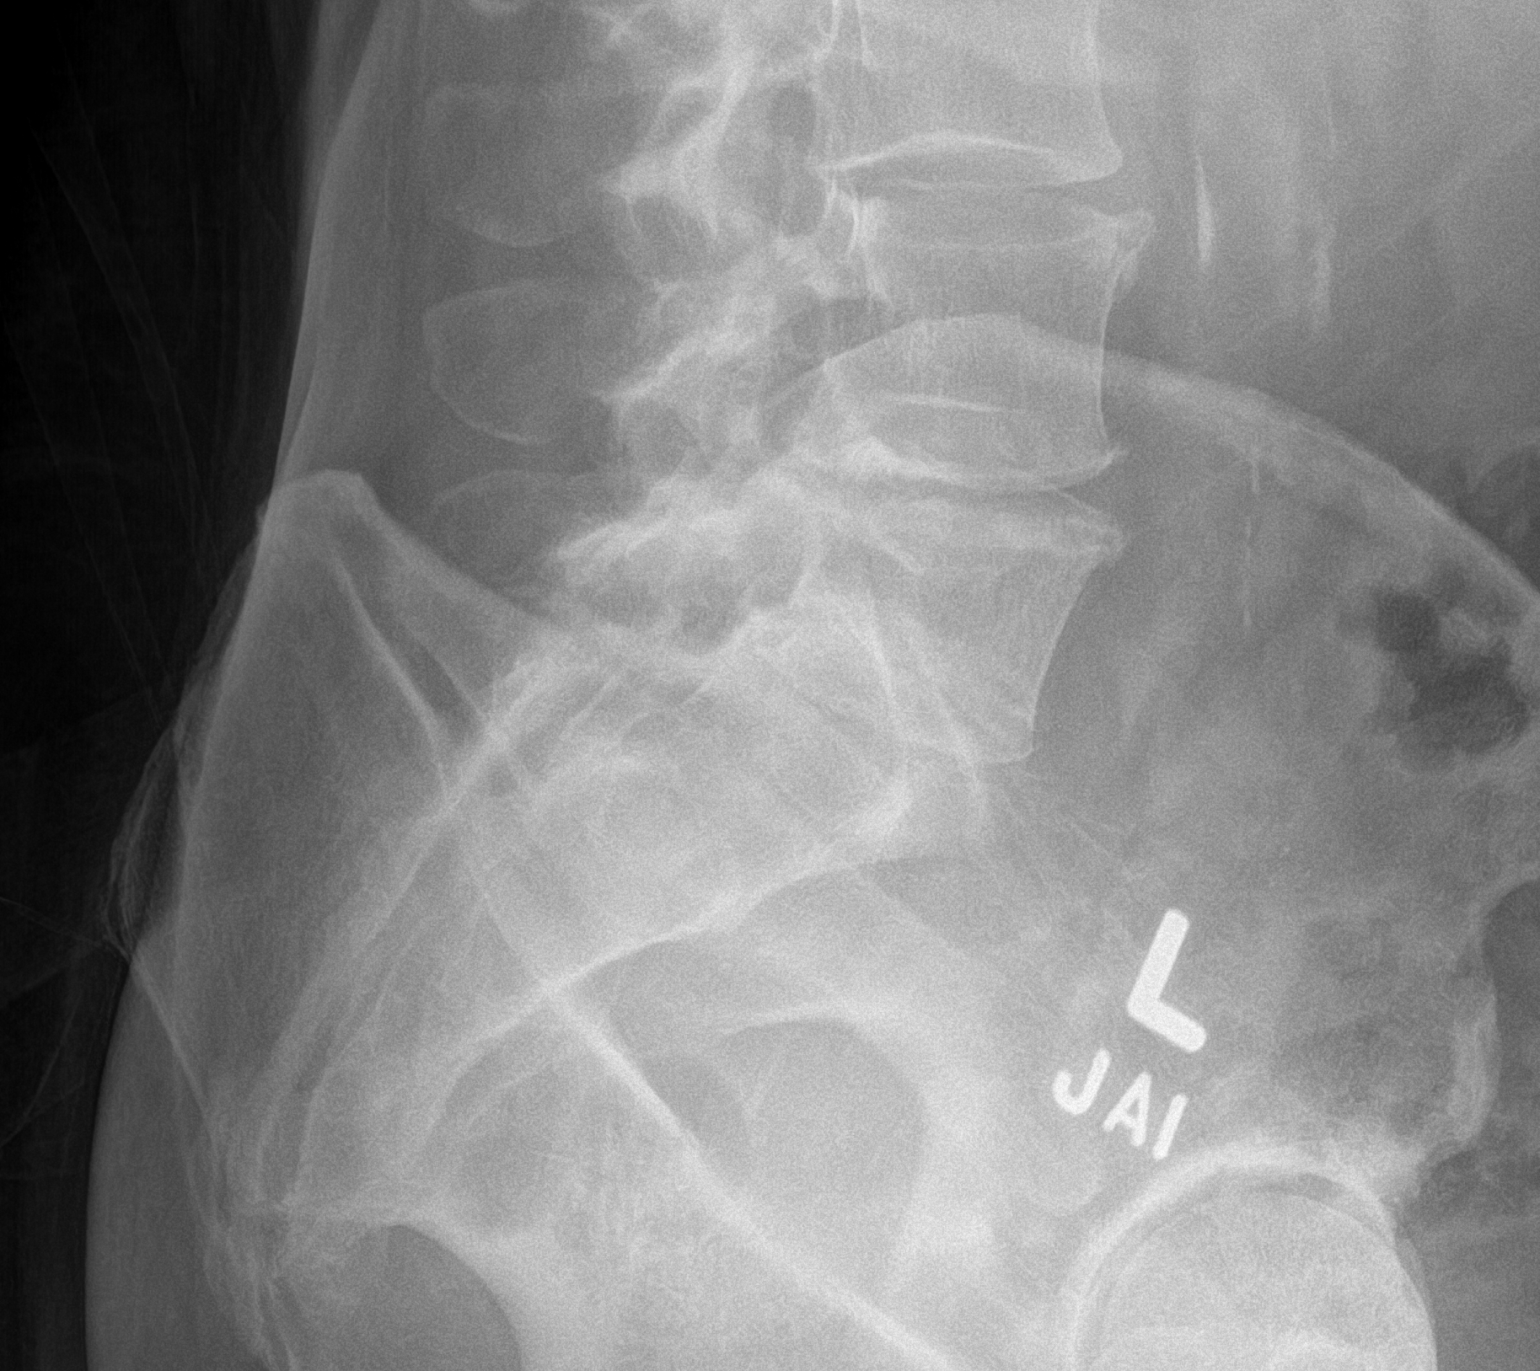

[3 of 3 positions shown; findings below may reference images not displayed]

FINDINGS: Lateral view is limited by positioning. Five lumbar type vertebra.
Slight straightening of normal lordosis. No listhesis. Disc space
narrowing and endplate spurring at L3-L4, L4-L5, and L5-S1. L4-L5
and L5-S1 facet hypertrophy. Vertebral body heights are normal. No
evidence of fracture or focal bone abnormality. The sacroiliac
joints are congruent.
IMPRESSION: 1. Degenerative disc disease at L3-L4, L4-L5, and L5-S1.
2. L4-L5 and L5-S1 facet hypertrophy.

## 2023-08-27 DIAGNOSIS — R531 Weakness: Secondary | ICD-10-CM | POA: Diagnosis not present

## 2023-08-27 DIAGNOSIS — M25512 Pain in left shoulder: Secondary | ICD-10-CM | POA: Diagnosis not present

## 2023-08-27 DIAGNOSIS — M25612 Stiffness of left shoulder, not elsewhere classified: Secondary | ICD-10-CM | POA: Diagnosis not present

## 2023-08-31 DIAGNOSIS — R531 Weakness: Secondary | ICD-10-CM | POA: Diagnosis not present

## 2023-08-31 DIAGNOSIS — M25512 Pain in left shoulder: Secondary | ICD-10-CM | POA: Diagnosis not present

## 2023-08-31 DIAGNOSIS — M25612 Stiffness of left shoulder, not elsewhere classified: Secondary | ICD-10-CM | POA: Diagnosis not present

## 2023-09-01 DIAGNOSIS — M25612 Stiffness of left shoulder, not elsewhere classified: Secondary | ICD-10-CM | POA: Diagnosis not present

## 2023-09-01 DIAGNOSIS — M25512 Pain in left shoulder: Secondary | ICD-10-CM | POA: Diagnosis not present

## 2023-09-01 DIAGNOSIS — R531 Weakness: Secondary | ICD-10-CM | POA: Diagnosis not present

## 2023-09-17 DIAGNOSIS — M25512 Pain in left shoulder: Secondary | ICD-10-CM | POA: Diagnosis not present

## 2023-09-23 ENCOUNTER — Other Ambulatory Visit: Payer: Self-pay | Admitting: Internal Medicine

## 2023-09-23 DIAGNOSIS — E785 Hyperlipidemia, unspecified: Secondary | ICD-10-CM

## 2023-09-27 DIAGNOSIS — M75122 Complete rotator cuff tear or rupture of left shoulder, not specified as traumatic: Secondary | ICD-10-CM | POA: Diagnosis not present

## 2023-09-28 DIAGNOSIS — Y999 Unspecified external cause status: Secondary | ICD-10-CM | POA: Diagnosis not present

## 2023-09-28 DIAGNOSIS — M7581 Other shoulder lesions, right shoulder: Secondary | ICD-10-CM | POA: Diagnosis not present

## 2023-09-28 DIAGNOSIS — S46011A Strain of muscle(s) and tendon(s) of the rotator cuff of right shoulder, initial encounter: Secondary | ICD-10-CM | POA: Diagnosis not present

## 2023-09-28 DIAGNOSIS — X58XXXA Exposure to other specified factors, initial encounter: Secondary | ICD-10-CM | POA: Diagnosis not present

## 2023-09-28 DIAGNOSIS — G8918 Other acute postprocedural pain: Secondary | ICD-10-CM | POA: Diagnosis not present

## 2023-09-28 DIAGNOSIS — M7541 Impingement syndrome of right shoulder: Secondary | ICD-10-CM | POA: Diagnosis not present

## 2023-10-07 ENCOUNTER — Other Ambulatory Visit: Payer: Self-pay | Admitting: Internal Medicine

## 2023-10-07 DIAGNOSIS — E519 Thiamine deficiency, unspecified: Secondary | ICD-10-CM

## 2023-10-13 DIAGNOSIS — M25611 Stiffness of right shoulder, not elsewhere classified: Secondary | ICD-10-CM | POA: Diagnosis not present

## 2023-10-13 DIAGNOSIS — R531 Weakness: Secondary | ICD-10-CM | POA: Diagnosis not present

## 2023-10-13 DIAGNOSIS — M25511 Pain in right shoulder: Secondary | ICD-10-CM | POA: Diagnosis not present

## 2023-10-13 DIAGNOSIS — M75101 Unspecified rotator cuff tear or rupture of right shoulder, not specified as traumatic: Secondary | ICD-10-CM | POA: Diagnosis not present

## 2023-10-18 DIAGNOSIS — M25511 Pain in right shoulder: Secondary | ICD-10-CM | POA: Diagnosis not present

## 2023-10-18 DIAGNOSIS — R531 Weakness: Secondary | ICD-10-CM | POA: Diagnosis not present

## 2023-10-18 DIAGNOSIS — M75101 Unspecified rotator cuff tear or rupture of right shoulder, not specified as traumatic: Secondary | ICD-10-CM | POA: Diagnosis not present

## 2023-10-18 DIAGNOSIS — M25611 Stiffness of right shoulder, not elsewhere classified: Secondary | ICD-10-CM | POA: Diagnosis not present

## 2023-10-21 DIAGNOSIS — M75101 Unspecified rotator cuff tear or rupture of right shoulder, not specified as traumatic: Secondary | ICD-10-CM | POA: Diagnosis not present

## 2023-10-21 DIAGNOSIS — M25611 Stiffness of right shoulder, not elsewhere classified: Secondary | ICD-10-CM | POA: Diagnosis not present

## 2023-10-21 DIAGNOSIS — R531 Weakness: Secondary | ICD-10-CM | POA: Diagnosis not present

## 2023-10-21 DIAGNOSIS — M25511 Pain in right shoulder: Secondary | ICD-10-CM | POA: Diagnosis not present

## 2023-10-25 DIAGNOSIS — M25511 Pain in right shoulder: Secondary | ICD-10-CM | POA: Insufficient documentation

## 2023-10-25 DIAGNOSIS — M75101 Unspecified rotator cuff tear or rupture of right shoulder, not specified as traumatic: Secondary | ICD-10-CM | POA: Insufficient documentation

## 2023-10-25 DIAGNOSIS — M25611 Stiffness of right shoulder, not elsewhere classified: Secondary | ICD-10-CM | POA: Diagnosis not present

## 2023-10-28 DIAGNOSIS — M25611 Stiffness of right shoulder, not elsewhere classified: Secondary | ICD-10-CM | POA: Diagnosis not present

## 2023-10-28 DIAGNOSIS — M25511 Pain in right shoulder: Secondary | ICD-10-CM | POA: Diagnosis not present

## 2023-10-28 DIAGNOSIS — M75101 Unspecified rotator cuff tear or rupture of right shoulder, not specified as traumatic: Secondary | ICD-10-CM | POA: Diagnosis not present

## 2023-11-01 DIAGNOSIS — M25511 Pain in right shoulder: Secondary | ICD-10-CM | POA: Diagnosis not present

## 2023-11-01 DIAGNOSIS — M75101 Unspecified rotator cuff tear or rupture of right shoulder, not specified as traumatic: Secondary | ICD-10-CM | POA: Diagnosis not present

## 2023-11-01 DIAGNOSIS — M25611 Stiffness of right shoulder, not elsewhere classified: Secondary | ICD-10-CM | POA: Diagnosis not present

## 2023-11-04 DIAGNOSIS — M25611 Stiffness of right shoulder, not elsewhere classified: Secondary | ICD-10-CM | POA: Diagnosis not present

## 2023-11-04 DIAGNOSIS — M25511 Pain in right shoulder: Secondary | ICD-10-CM | POA: Diagnosis not present

## 2023-11-04 DIAGNOSIS — M75101 Unspecified rotator cuff tear or rupture of right shoulder, not specified as traumatic: Secondary | ICD-10-CM | POA: Diagnosis not present

## 2023-11-08 DIAGNOSIS — M75101 Unspecified rotator cuff tear or rupture of right shoulder, not specified as traumatic: Secondary | ICD-10-CM | POA: Diagnosis not present

## 2023-11-08 DIAGNOSIS — M25511 Pain in right shoulder: Secondary | ICD-10-CM | POA: Diagnosis not present

## 2023-11-08 DIAGNOSIS — M25611 Stiffness of right shoulder, not elsewhere classified: Secondary | ICD-10-CM | POA: Diagnosis not present

## 2023-11-11 DIAGNOSIS — M25511 Pain in right shoulder: Secondary | ICD-10-CM | POA: Diagnosis not present

## 2023-11-11 DIAGNOSIS — M25611 Stiffness of right shoulder, not elsewhere classified: Secondary | ICD-10-CM | POA: Diagnosis not present

## 2023-11-11 DIAGNOSIS — M75101 Unspecified rotator cuff tear or rupture of right shoulder, not specified as traumatic: Secondary | ICD-10-CM | POA: Diagnosis not present

## 2023-11-15 DIAGNOSIS — M75101 Unspecified rotator cuff tear or rupture of right shoulder, not specified as traumatic: Secondary | ICD-10-CM | POA: Diagnosis not present

## 2023-11-15 DIAGNOSIS — M25511 Pain in right shoulder: Secondary | ICD-10-CM | POA: Diagnosis not present

## 2023-11-15 DIAGNOSIS — M25611 Stiffness of right shoulder, not elsewhere classified: Secondary | ICD-10-CM | POA: Diagnosis not present

## 2023-11-17 DIAGNOSIS — M25611 Stiffness of right shoulder, not elsewhere classified: Secondary | ICD-10-CM | POA: Diagnosis not present

## 2023-11-17 DIAGNOSIS — M25511 Pain in right shoulder: Secondary | ICD-10-CM | POA: Diagnosis not present

## 2023-11-17 DIAGNOSIS — M75101 Unspecified rotator cuff tear or rupture of right shoulder, not specified as traumatic: Secondary | ICD-10-CM | POA: Diagnosis not present

## 2023-11-18 DIAGNOSIS — R3912 Poor urinary stream: Secondary | ICD-10-CM | POA: Diagnosis not present

## 2023-11-18 DIAGNOSIS — E291 Testicular hypofunction: Secondary | ICD-10-CM | POA: Diagnosis not present

## 2023-11-18 DIAGNOSIS — R3914 Feeling of incomplete bladder emptying: Secondary | ICD-10-CM | POA: Diagnosis not present

## 2023-11-18 DIAGNOSIS — N400 Enlarged prostate without lower urinary tract symptoms: Secondary | ICD-10-CM | POA: Diagnosis not present

## 2023-11-18 DIAGNOSIS — R351 Nocturia: Secondary | ICD-10-CM | POA: Diagnosis not present

## 2023-11-22 ENCOUNTER — Other Ambulatory Visit: Payer: Self-pay | Admitting: Urology

## 2023-11-22 ENCOUNTER — Encounter: Payer: Self-pay | Admitting: Urology

## 2023-11-22 ENCOUNTER — Encounter: Payer: Self-pay | Admitting: Hematology and Oncology

## 2023-11-22 DIAGNOSIS — M25511 Pain in right shoulder: Secondary | ICD-10-CM | POA: Diagnosis not present

## 2023-11-22 DIAGNOSIS — M25611 Stiffness of right shoulder, not elsewhere classified: Secondary | ICD-10-CM | POA: Diagnosis not present

## 2023-11-22 DIAGNOSIS — R972 Elevated prostate specific antigen [PSA]: Secondary | ICD-10-CM

## 2023-11-22 DIAGNOSIS — M75101 Unspecified rotator cuff tear or rupture of right shoulder, not specified as traumatic: Secondary | ICD-10-CM | POA: Diagnosis not present

## 2023-11-25 DIAGNOSIS — M25611 Stiffness of right shoulder, not elsewhere classified: Secondary | ICD-10-CM | POA: Diagnosis not present

## 2023-11-25 DIAGNOSIS — M75101 Unspecified rotator cuff tear or rupture of right shoulder, not specified as traumatic: Secondary | ICD-10-CM | POA: Diagnosis not present

## 2023-11-25 DIAGNOSIS — M25511 Pain in right shoulder: Secondary | ICD-10-CM | POA: Diagnosis not present

## 2023-11-29 DIAGNOSIS — M75101 Unspecified rotator cuff tear or rupture of right shoulder, not specified as traumatic: Secondary | ICD-10-CM | POA: Diagnosis not present

## 2023-11-29 DIAGNOSIS — M25511 Pain in right shoulder: Secondary | ICD-10-CM | POA: Diagnosis not present

## 2023-11-29 DIAGNOSIS — M25611 Stiffness of right shoulder, not elsewhere classified: Secondary | ICD-10-CM | POA: Diagnosis not present

## 2023-12-02 DIAGNOSIS — M25511 Pain in right shoulder: Secondary | ICD-10-CM | POA: Diagnosis not present

## 2023-12-02 DIAGNOSIS — M75101 Unspecified rotator cuff tear or rupture of right shoulder, not specified as traumatic: Secondary | ICD-10-CM | POA: Diagnosis not present

## 2023-12-02 DIAGNOSIS — M25611 Stiffness of right shoulder, not elsewhere classified: Secondary | ICD-10-CM | POA: Diagnosis not present

## 2023-12-07 ENCOUNTER — Ambulatory Visit
Admission: RE | Admit: 2023-12-07 | Discharge: 2023-12-07 | Disposition: A | Discharge status: Home / Self Care | Source: Ambulatory Visit | Attending: Urology | Admitting: Urology

## 2023-12-07 DIAGNOSIS — R972 Elevated prostate specific antigen [PSA]: Secondary | ICD-10-CM

## 2023-12-07 MED ORDER — GADOPICLENOL 0.5 MMOL/ML IV SOLN
10.0000 mL | Freq: Once | INTRAVENOUS | Status: AC | PRN
  Administered 2023-12-07: 10 mL via INTRAVENOUS

## 2023-12-23 DIAGNOSIS — M25611 Stiffness of right shoulder, not elsewhere classified: Secondary | ICD-10-CM | POA: Diagnosis not present

## 2023-12-23 DIAGNOSIS — M25511 Pain in right shoulder: Secondary | ICD-10-CM | POA: Diagnosis not present

## 2023-12-23 DIAGNOSIS — M75101 Unspecified rotator cuff tear or rupture of right shoulder, not specified as traumatic: Secondary | ICD-10-CM | POA: Diagnosis not present

## 2023-12-27 ENCOUNTER — Other Ambulatory Visit: Payer: Self-pay | Admitting: Internal Medicine

## 2023-12-27 DIAGNOSIS — I10 Essential (primary) hypertension: Secondary | ICD-10-CM

## 2023-12-27 DIAGNOSIS — N401 Enlarged prostate with lower urinary tract symptoms: Secondary | ICD-10-CM

## 2023-12-29 DIAGNOSIS — M75101 Unspecified rotator cuff tear or rupture of right shoulder, not specified as traumatic: Secondary | ICD-10-CM | POA: Diagnosis not present

## 2023-12-29 DIAGNOSIS — M25511 Pain in right shoulder: Secondary | ICD-10-CM | POA: Diagnosis not present

## 2023-12-29 DIAGNOSIS — M25611 Stiffness of right shoulder, not elsewhere classified: Secondary | ICD-10-CM | POA: Diagnosis not present

## 2023-12-31 ENCOUNTER — Other Ambulatory Visit: Payer: Self-pay

## 2023-12-31 ENCOUNTER — Telehealth: Payer: Self-pay

## 2023-12-31 DIAGNOSIS — M75101 Unspecified rotator cuff tear or rupture of right shoulder, not specified as traumatic: Secondary | ICD-10-CM | POA: Diagnosis not present

## 2023-12-31 DIAGNOSIS — I10 Essential (primary) hypertension: Secondary | ICD-10-CM

## 2023-12-31 DIAGNOSIS — N401 Enlarged prostate with lower urinary tract symptoms: Secondary | ICD-10-CM

## 2023-12-31 DIAGNOSIS — M25511 Pain in right shoulder: Secondary | ICD-10-CM | POA: Diagnosis not present

## 2023-12-31 DIAGNOSIS — M25611 Stiffness of right shoulder, not elsewhere classified: Secondary | ICD-10-CM | POA: Diagnosis not present

## 2023-12-31 MED ORDER — TORSEMIDE 20 MG PO TABS: 20.0000 mg | ORAL_TABLET | Freq: Every day | ORAL | 0 refills | Status: DC

## 2023-12-31 MED ORDER — VALSARTAN 160 MG PO TABS: 160.0000 mg | ORAL_TABLET | Freq: Two times a day (BID) | ORAL | 0 refills | Status: DC

## 2023-12-31 NOTE — Telephone Encounter (Signed)
 Copied from CRM #[id number]. Topic: Clinical - Medication Question >> Dec 31, 2023 10:38 AM Alexandria E wrote: Reason for CRM: Patient stated that his pharmacy relayed he would not be able to pick up valsartan  (DIOVAN ) 160 MG tablet , torsemide  (DEMADEX ) 20 MG tablet , or tadalafil  (CIALIS ) 5 MG tablet  until he had a visit with his PCP. Agent was able to schedule patient for 1/6, as that was the soonest available. Patient questioning if he can have a bridge fill until he gets seen by his provider.

## 2023-12-31 NOTE — Telephone Encounter (Signed)
 Medication has been refilled for 25 days until his appointment

## 2024-01-04 DIAGNOSIS — N4231 Prostatic intraepithelial neoplasia: Secondary | ICD-10-CM | POA: Diagnosis not present

## 2024-01-04 DIAGNOSIS — C61 Malignant neoplasm of prostate: Secondary | ICD-10-CM | POA: Diagnosis not present

## 2024-01-04 DIAGNOSIS — R972 Elevated prostate specific antigen [PSA]: Secondary | ICD-10-CM | POA: Diagnosis not present

## 2024-01-25 ENCOUNTER — Encounter: Payer: Self-pay | Admitting: Internal Medicine

## 2024-01-25 ENCOUNTER — Ambulatory Visit (INDEPENDENT_AMBULATORY_CARE_PROVIDER_SITE_OTHER): Admitting: Internal Medicine

## 2024-01-25 ENCOUNTER — Ambulatory Visit: Payer: Self-pay | Admitting: Internal Medicine

## 2024-01-25 VITALS — BP 134/84 | HR 63 | Temp 98.5°F | Resp 16 | Ht 70.0 in | Wt 212.2 lb

## 2024-01-25 DIAGNOSIS — C61 Malignant neoplasm of prostate: Secondary | ICD-10-CM | POA: Insufficient documentation

## 2024-01-25 DIAGNOSIS — F0280 Dementia in other diseases classified elsewhere without behavioral disturbance: Secondary | ICD-10-CM | POA: Diagnosis not present

## 2024-01-25 DIAGNOSIS — I10 Essential (primary) hypertension: Secondary | ICD-10-CM | POA: Diagnosis not present

## 2024-01-25 DIAGNOSIS — R739 Hyperglycemia, unspecified: Secondary | ICD-10-CM

## 2024-01-25 DIAGNOSIS — E781 Pure hyperglyceridemia: Secondary | ICD-10-CM | POA: Diagnosis not present

## 2024-01-25 DIAGNOSIS — E782 Mixed hyperlipidemia: Secondary | ICD-10-CM | POA: Diagnosis not present

## 2024-01-25 DIAGNOSIS — E519 Thiamine deficiency, unspecified: Secondary | ICD-10-CM | POA: Diagnosis not present

## 2024-01-25 DIAGNOSIS — K7010 Alcoholic hepatitis without ascites: Secondary | ICD-10-CM | POA: Diagnosis not present

## 2024-01-25 DIAGNOSIS — Z23 Encounter for immunization: Secondary | ICD-10-CM | POA: Diagnosis not present

## 2024-01-25 DIAGNOSIS — N401 Enlarged prostate with lower urinary tract symptoms: Secondary | ICD-10-CM | POA: Diagnosis not present

## 2024-01-25 DIAGNOSIS — E785 Hyperlipidemia, unspecified: Secondary | ICD-10-CM

## 2024-01-25 DIAGNOSIS — E039 Hypothyroidism, unspecified: Secondary | ICD-10-CM

## 2024-01-25 DIAGNOSIS — Z0000 Encounter for general adult medical examination without abnormal findings: Secondary | ICD-10-CM | POA: Diagnosis not present

## 2024-01-25 DIAGNOSIS — Z0001 Encounter for general adult medical examination with abnormal findings: Secondary | ICD-10-CM

## 2024-01-25 LAB — CBC WITH DIFFERENTIAL/PLATELET
Basophils Absolute: 0 K/uL (ref 0.0–0.1)
Basophils Relative: 1.2 % (ref 0.0–3.0)
Eosinophils Absolute: 0.1 K/uL (ref 0.0–0.7)
Eosinophils Relative: 1.9 % (ref 0.0–5.0)
HCT: 43 % (ref 39.0–52.0)
Hemoglobin: 14.8 g/dL (ref 13.0–17.0)
Lymphocytes Relative: 43.5 % (ref 12.0–46.0)
Lymphs Abs: 1.7 K/uL (ref 0.7–4.0)
MCHC: 34.5 g/dL (ref 30.0–36.0)
MCV: 93.9 fl (ref 78.0–100.0)
Monocytes Absolute: 0.5 K/uL (ref 0.1–1.0)
Monocytes Relative: 12.3 % — ABNORMAL HIGH (ref 3.0–12.0)
Neutro Abs: 1.6 K/uL (ref 1.4–7.7)
Neutrophils Relative %: 41.1 % — ABNORMAL LOW (ref 43.0–77.0)
Platelets: 171 K/uL (ref 150.0–400.0)
RBC: 4.58 Mil/uL (ref 4.22–5.81)
RDW: 13.8 % (ref 11.5–15.5)
WBC: 3.9 K/uL — ABNORMAL LOW (ref 4.0–10.5)

## 2024-01-25 LAB — BASIC METABOLIC PANEL WITH GFR
BUN: 20 mg/dL (ref 6–23)
CO2: 29 meq/L (ref 19–32)
Calcium: 9.1 mg/dL (ref 8.4–10.5)
Chloride: 102 meq/L (ref 96–112)
Creatinine, Ser: 1.29 mg/dL (ref 0.40–1.50)
GFR: 59.06 mL/min — ABNORMAL LOW
Glucose, Bld: 90 mg/dL (ref 70–99)
Potassium: 4.1 meq/L (ref 3.5–5.1)
Sodium: 138 meq/L (ref 135–145)

## 2024-01-25 LAB — HEPATIC FUNCTION PANEL
ALT: 56 U/L — ABNORMAL HIGH (ref 3–53)
AST: 57 U/L — ABNORMAL HIGH (ref 5–37)
Albumin: 4.3 g/dL (ref 3.5–5.2)
Alkaline Phosphatase: 62 U/L (ref 39–117)
Bilirubin, Direct: 0.1 mg/dL (ref 0.1–0.3)
Total Bilirubin: 0.6 mg/dL (ref 0.2–1.2)
Total Protein: 7 g/dL (ref 6.0–8.3)

## 2024-01-25 LAB — PROTIME-INR
INR: 1 ratio (ref 0.8–1.0)
Prothrombin Time: 11.1 s (ref 9.6–13.1)

## 2024-01-25 LAB — LIPID PANEL
Cholesterol: 175 mg/dL (ref 28–200)
HDL: 44.5 mg/dL
LDL Cholesterol: 92 mg/dL (ref 10–99)
NonHDL: 130.49
Total CHOL/HDL Ratio: 4
Triglycerides: 194 mg/dL — ABNORMAL HIGH (ref 10.0–149.0)
VLDL: 38.8 mg/dL (ref 0.0–40.0)

## 2024-01-25 LAB — HEMOGLOBIN A1C: Hgb A1c MFr Bld: 5.7 % (ref 4.6–6.5)

## 2024-01-25 LAB — TSH: TSH: 5.9 u[IU]/mL — ABNORMAL HIGH (ref 0.35–5.50)

## 2024-01-25 MED ORDER — VITAMIN B-1 50 MG PO TABS: 50.0000 mg | ORAL_TABLET | Freq: Every day | ORAL | 1 refills | Status: AC

## 2024-01-25 MED ORDER — TORSEMIDE 20 MG PO TABS: 20.0000 mg | ORAL_TABLET | Freq: Every day | ORAL | 1 refills | Status: AC

## 2024-01-25 MED ORDER — TADALAFIL 5 MG PO TABS: 5.0000 mg | ORAL_TABLET | Freq: Every day | ORAL | 1 refills | Status: AC

## 2024-01-25 MED ORDER — LEVOTHYROXINE SODIUM 25 MCG PO TABS: 25.0000 ug | ORAL_TABLET | Freq: Every day | ORAL | 0 refills | Status: AC

## 2024-01-25 MED ORDER — ROSUVASTATIN CALCIUM 5 MG PO TABS: 5.0000 mg | ORAL_TABLET | Freq: Every day | ORAL | 0 refills | Status: AC

## 2024-01-25 MED ORDER — VALSARTAN 160 MG PO TABS: 160.0000 mg | ORAL_TABLET | Freq: Two times a day (BID) | ORAL | 1 refills | Status: AC

## 2024-01-25 NOTE — Patient Instructions (Signed)
 Health Maintenance, Male  Adopting a healthy lifestyle and getting preventive care are important in promoting health and wellness. Ask your health care provider about:  The right schedule for you to have regular tests and exams.  Things you can do on your own to prevent diseases and keep yourself healthy.  What should I know about diet, weight, and exercise?  Eat a healthy diet    Eat a diet that includes plenty of vegetables, fruits, low-fat dairy products, and lean protein.  Do not eat a lot of foods that are high in solid fats, added sugars, or sodium.  Maintain a healthy weight  Body mass index (BMI) is a measurement that can be used to identify possible weight problems. It estimates body fat based on height and weight. Your health care provider can help determine your BMI and help you achieve or maintain a healthy weight.  Get regular exercise  Get regular exercise. This is one of the most important things you can do for your health. Most adults should:  Exercise for at least 150 minutes each week. The exercise should increase your heart rate and make you sweat (moderate-intensity exercise).  Do strengthening exercises at least twice a week. This is in addition to the moderate-intensity exercise.  Spend less time sitting. Even light physical activity can be beneficial.  Watch cholesterol and blood lipids  Have your blood tested for lipids and cholesterol at 64 years of age, then have this test every 5 years.  You may need to have your cholesterol levels checked more often if:  Your lipid or cholesterol levels are high.  You are older than 64 years of age.  You are at high risk for heart disease.  What should I know about cancer screening?  Many types of cancers can be detected early and may often be prevented. Depending on your health history and family history, you may need to have cancer screening at various ages. This may include screening for:  Colorectal cancer.  Prostate cancer.  Skin cancer.  Lung  cancer.  What should I know about heart disease, diabetes, and high blood pressure?  Blood pressure and heart disease  High blood pressure causes heart disease and increases the risk of stroke. This is more likely to develop in people who have high blood pressure readings or are overweight.  Talk with your health care provider about your target blood pressure readings.  Have your blood pressure checked:  Every 3-5 years if you are 24-52 years of age.  Every year if you are 3 years old or older.  If you are between the ages of 60 and 72 and are a current or former smoker, ask your health care provider if you should have a one-time screening for abdominal aortic aneurysm (AAA).  Diabetes  Have regular diabetes screenings. This checks your fasting blood sugar level. Have the screening done:  Once every three years after age 66 if you are at a normal weight and have a low risk for diabetes.  More often and at a younger age if you are overweight or have a high risk for diabetes.  What should I know about preventing infection?  Hepatitis B  If you have a higher risk for hepatitis B, you should be screened for this virus. Talk with your health care provider to find out if you are at risk for hepatitis B infection.  Hepatitis C  Blood testing is recommended for:  Everyone born from [birth year] through 1965.  Anyone  with known risk factors for hepatitis C.  Sexually transmitted infections (STIs)  You should be screened each year for STIs, including gonorrhea and chlamydia, if:  You are sexually active and are younger than 64 years of age.  You are older than 64 years of age and your health care provider tells you that you are at risk for this type of infection.  Your sexual activity has changed since you were last screened, and you are at increased risk for chlamydia or gonorrhea. Ask your health care provider if you are at risk.  Ask your health care provider about whether you are at high risk for HIV. Your health care provider  may recommend a prescription medicine to help prevent HIV infection. If you choose to take medicine to prevent HIV, you should first get tested for HIV. You should then be tested every 3 months for as long as you are taking the medicine.  Follow these instructions at home:  Alcohol use  Do not drink alcohol if your health care provider tells you not to drink.  If you drink alcohol:  Limit how much you have to 0-2 drinks a day.  Know how much alcohol is in your drink. In the [place] one drink equals one 12 oz bottle of beer (355 mL), one 5 oz glass of wine (148 mL), or one 1 oz glass of hard liquor (44 mL).  Lifestyle  Do not use any products that contain nicotine or tobacco. These products include cigarettes, chewing tobacco, and vaping devices, such as e-cigarettes. If you need help quitting, ask your health care provider.  Do not use street drugs.  Do not share needles.  Ask your health care provider for help if you need support or information about quitting drugs.  General instructions  Schedule regular health, dental, and eye exams.  Stay current with your vaccines.  Tell your health care provider if:  You often feel depressed.  You have ever been abused or do not feel safe at home.  Summary  Adopting a healthy lifestyle and getting preventive care are important in promoting health and wellness.  Follow your health care provider's instructions about healthy diet, exercising, and getting tested or screened for diseases.  Follow your health care provider's instructions on monitoring your cholesterol and blood pressure.  This information is not intended to replace advice given to you by your health care provider. Make sure you discuss any questions you have with your health care provider.  Document Revised: 05/27/2020 Document Reviewed: 05/27/2020  Elsevier Patient Education  2024 [employer].

## 2024-01-25 NOTE — Progress Notes (Signed)
 "  Subjective:  Patient ID: Keith Bullock, male    DOB: 02-17-1960  Age: 64 y.o. MRN: [id number]  CC: Annual Exam, Hypertension, and Hyperlipidemia   HPI Keith Bullock presents for a CPX and f/up ---  Discussed the use of AI scribe software for clinical note transcription with the patient, who gave verbal consent to proceed.  History of Present Illness Keith Bullock is a 64 year old male with early stage prostate cancer who presents for follow-up and management of his condition.  He is recovering from rotator cuff surgery performed on September 9th and anticipates another month or two before resuming activities such as playing golf.  He was diagnosed with early stage prostate cancer following a urologist consultation due to urinary symptoms and a PSA level of 4. An MRI confirmed the presence of cancer, and he is in the process of scheduling robotic surgery for prostate removal.  He experiences occasional dizziness and lightheadedness, particularly when bending down, which he attributes to low blood pressure. He takes valsartan  and torsemide , and sometimes reduces his dosage when his blood pressure drops too low, noting readings as low as 95-100 mmHg systolic.  He is currently taking cialis  to ease nocturnal urination, which he finds helpful, although he mentions a recent issue with refilling the prescription.  No chest pain, shortness of breath, or trouble breathing. He has reduced his alcohol intake significantly in the past few weeks, stating he hasn't consumed alcohol since the first of the month. He is not a smoker and has received both the COVID-19 and flu vaccines in October.     Outpatient Medications Prior to Visit  Medication Sig Dispense Refill   cetirizine (ZYRTEC) 10 MG tablet Take 10 mg by mouth daily as needed for allergies.     ezetimibe  (ZETIA ) 10 MG tablet TAKE 1 TABLET (10 MG TOTAL) BY MOUTH DAILY AT BEDTIME 90 tablet 1   Famotidine-Ca Carb-Mag Hydrox (PEPCID  COMPLETE PO) Take 1 tablet by mouth daily as needed (acid reflux).     Multiple Vitamin (MULTIVITAMIN WITH MINERALS) TABS tablet Take 1 tablet by mouth daily.     tadalafil  (CIALIS ) 5 MG tablet TAKE 1 TABLET (5 MG TOTAL) BY MOUTH DAILY. 90 tablet 1   thiamine  (VITAMIN B-1) 50 MG tablet TAKE 1 TABLET (50 MG TOTAL) BY MOUTH DAILY 90 tablet 1   torsemide  (DEMADEX ) 20 MG tablet Take 1 tablet (20 mg total) by mouth daily. 25 tablet 0   valsartan  (DIOVAN ) 160 MG tablet Take 1 tablet (160 mg total) by mouth 2 (two) times daily. 50 tablet 0   No facility-administered medications prior to visit.    ROS Review of Systems  Constitutional: Negative.  Negative for appetite change, chills, diaphoresis, fatigue and fever.  HENT: Negative.    Eyes: Negative.   Respiratory:  Negative for cough, chest tightness, shortness of breath and wheezing.   Cardiovascular:  Negative for chest pain, palpitations and leg swelling.  Gastrointestinal:  Negative for abdominal pain, constipation, diarrhea, nausea and vomiting.  Endocrine: Negative.   Genitourinary:  Positive for difficulty urinating. Negative for dysuria.  Musculoskeletal: Negative.   Skin: Negative.   Neurological: Negative.  Negative for dizziness, weakness and light-headedness.  Hematological:  Negative for adenopathy. Does not bruise/bleed easily.  Psychiatric/Behavioral: Negative.      Objective:  BP 134/84 (BP Location: Left Arm, Patient Position: Sitting, Cuff Size: Normal)   Pulse 63   Temp 98.5 F (36.9 C) (Temporal)   Resp 16  Ht 5' 10 (1.778 m)   Wt 212 lb 3.2 oz (96.3 kg)   SpO2 97%   BMI 30.45 kg/m   BP Readings from Last 3 Encounters:  01/25/24 134/84  05/06/23 126/86  03/25/23 (!) 162/78    Wt Readings from Last 3 Encounters:  01/25/24 212 lb 3.2 oz (96.3 kg)  05/06/23 206 lb 12.8 oz (93.8 kg)  03/25/23 208 lb 6.4 oz (94.5 kg)    Physical Exam Vitals reviewed.  Constitutional:      General: He is not in acute  distress.    Appearance: He is ill-appearing. He is not toxic-appearing or diaphoretic.  HENT:     Nose: Nose normal.     Mouth/Throat:     Mouth: Mucous membranes are moist.  Eyes:     General: No scleral icterus.    Conjunctiva/sclera: Conjunctivae normal.     Comments: ++ preorbital edema  Cardiovascular:     Rate and Rhythm: Normal rate and regular rhythm.     Heart sounds: Normal heart sounds, S1 normal and S2 normal. No murmur heard.    No friction rub. No gallop.     Comments: EKG--- NSR, 60 bpm No LVH, Q waves, or ST/T wave changes  Pulmonary:     Effort: Pulmonary effort is normal. No respiratory distress.     Breath sounds: No stridor. No wheezing, rhonchi or rales.  Chest:     Chest wall: No tenderness.  Abdominal:     General: Abdomen is flat. Bowel sounds are normal.     Palpations: There is no mass.     Tenderness: There is no abdominal tenderness. There is no guarding.     Hernia: No hernia is present.  Musculoskeletal:        General: Normal range of motion.     Cervical back: Neck supple.     Right lower leg: No edema.     Left lower leg: No edema.  Skin:    General: Skin is warm and dry.  Neurological:     General: No focal deficit present.     Mental Status: He is alert. Mental status is at baseline.  Psychiatric:        Mood and Affect: Mood normal.        Behavior: Behavior normal.     Lab Results  Component Value Date   WBC 3.9 (L) 01/25/2024   HGB 14.8 01/25/2024   HCT 43.0 01/25/2024   PLT 171.0 01/25/2024   GLUCOSE 90 01/25/2024   CHOL 175 01/25/2024   TRIG 194.0 (H) 01/25/2024   HDL 44.50 01/25/2024   LDLCALC 92 01/25/2024   ALT 56 (H) 01/25/2024   AST 57 (H) 01/25/2024   NA 138 01/25/2024   K 4.1 01/25/2024   CL 102 01/25/2024   CREATININE 1.29 01/25/2024   BUN 20 01/25/2024   CO2 29 01/25/2024   TSH 5.90 (H) 01/25/2024   PSA 4.06 (H) 03/25/2023   INR 1.0 01/25/2024   HGBA1C 5.7 01/25/2024    MR PROSTATE W WO  CONTRAST Result Date: 12/08/2023 EXAM: MRI PROSTATE WITHOUT AND WITH INTRAVENOUS CONTRAST 12/07/2023 10:41:04 AM TECHNIQUE: Multiparametric MRI imaging of the prostate without and with dynamic contrast enhanced imaging and diffusion weighted imaging was performed. Dynacad/CAD was utilized in analysis of images. CONTRAST: 10 mL of Vueway . COMPARISON: None available. CLINICAL HISTORY: Elevated prostate specific antigen, nocturia, low urinary frequency, incomplete bladder emptying, history of weak urinary stream, BPH/LUTS, ED, hypogonadism. FINDINGS: PROSTATE: Encapsulated nodularity in  the transition zone compatible with benign prostatic hypertrophy. Prostate volume by 3d volumetric analysis: 47.0 cubic centimeters (5.6 x 4.4 x 4.2 cm). Region of interest # 1: PI-RADS category 4 lesion of the bilateral anterior fibromuscular stroma in the mid gland, with reduced T2 signal (image 35 series 6) corresponding to focally reduced ADC map activity and restricted diffusion (image 12 series 8 and 9). This measures 0.31 cubic cm (1.2 x 0.4 x 0.8 cm). Region of interest # 2: Paracentral category 3 lesion in the left posterolateral peripheral zone at the apex with focally reduced T2 signal (image 45 series 6) corresponding to reduced ADC map activity (image 15 series 8). This measures 0.17 cubic centimeters (1.2 x 0.5 x 0.4 cm). Linear low bandlike T2 signal in the right posterolateral peripheral zone in the mid gland and base on image 30 series 6 is likely postinflammatory based on morphology, and consider PI-RADS category 2. SEMINAL VESICLES: Unremarkable. NEUROVASCULAR BUNDLE: Unremarkable. LYMPHADENOPATHY: No lymphadenopathy. BLADDER: The bladder is unremarkable. BOWEL: Sigmoid colon diverticulosis. PERITONEAL CAVITY: No free fluid. BONES: Normal bone marrow signal intensity. No suspicious or aggressive osseous lesions. SOFT TISSUES: 2.6 x 0.9 cm bilobed fluid collection between the right gluteus minimus and right rectus  femoris proximal musculature on image 41 series 14, possibly a ganglion cyst or paralabral cyst. IMPRESSION: 1. ROI #1: PI-RADS 4 lesion in the bilateral anterior fibromuscular stroma in the mid gland, measuring 0.31 cm, with reduced T2 signal, restricted diffusion, and reduced ADC map activity. 2. ROI #2: PI-RADS 3 lesion in the left posterolateral peripheral zone at the apex, measuring 0.17 cm, with focally reduced T2 signal and reduced ADC map activity. 3. Encapsulated nodularity in the transition zone compatible with benign prostatic hypertrophy. 4. Estimated prostate volume of 47.0 mL. 5. 2.6 x 0.9 cm bilobed fluid collection between the right gluteus minimus and right rectus femoris proximal musculature, possibly a ganglion cyst or paralabral cyst. 6. Sigmoid colon diverticulosis. Electronically signed by: Ryan Salvage MD 12/08/2023 01:24 PM EST RP Workstation: [id number]   The 10-year ASCVD risk score (Arnett DK, et al., 2019) is: 13.3%   Values used to calculate the score:     Age: 60 years     Clinically relevant sex: Male     Is Non-Hispanic African American: No     Diabetic: No     Tobacco smoker: No     Systolic Blood Pressure: 134 mmHg     Is BP treated: Yes     HDL Cholesterol: 44.5 mg/dL     Total Cholesterol: 175 mg/dL   MELD 3.0: 9 at 08/22/7971  2:50 PM MELD-Na: 9 at 01/25/2024  2:50 PM Calculated from: Serum Creatinine: 1.29 mg/dL at 08/22/7971  7:49 PM Serum Sodium: 138 mEq/L (Using max of 137 mEq/L) at 01/25/2024  2:50 PM Total Bilirubin: 0.6 mg/dL (Using min of 1 mg/dL) at 08/22/7971  7:49 PM Serum Albumin: 4.3 g/dL (Using max of 3.5 g/dL) at 08/22/7971  7:49 PM INR(ratio): 1 ratio at 01/25/2024  2:50 PM Age at listing (hypothetical): 76 years Sex: Male at 01/25/2024  2:50 PM     Assessment & Plan:  Alcoholic hepatitis without ascites (HCC) -     Hepatic function panel; Future -     Protime-INR; Future -     AMB Referral [facility]BCI Care Management  Primary hypertension- BP is  well controlled. EKG is negative for LVH. -     Valsartan ; Take 1 tablet (160 mg total) by mouth 2 (two) times daily.  Dispense: 180 tablet; Refill: 1 -     Torsemide ; Take 1 tablet (20 mg total) by mouth daily.  Dispense: 90 tablet; Refill: 1 -     Basic metabolic panel with GFR; Future -     CBC with Differential/Platelet; Future -     TSH; Future -     EKG 12-Lead -     AMB Referral [facility]BCI Care Management  Benign prostatic hyperplasia (BPH) with straining on urination -     Tadalafil ; Take 1 tablet (5 mg total) by mouth daily.  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  Dispense: 90 tablet; Refill: 1  Hypertriglyceridemia -     Lipid panel; Future  Hyperglycemia -     Basic metabolic panel with GFR; Future -     Hemoglobin A1c; Future  Encounter for general adult medical examination with abnormal findings- Exam completed, labs reviewed, vaccines reviewed and updated, cancer screenings are UTD, pt ed material was given.   Immunization due -     Pneumococcal conjugate vaccine 20-valent -     Tdap vaccine greater than or equal to 7yo IM  Prostate cancer (HCC)  Dyslipidemia, goal LDL below 70 -     Rosuvastatin  Calcium ; Take 1 tablet (5 mg total) by mouth daily.  Dispense: 90 tablet; Refill: 0 -     AMB Referral [facility]BCI Care Management  Acquired hypothyroidism -     Levothyroxine  Sodium; Take 1 tablet (25 mcg total) by mouth daily before breakfast.  Dispense: 90 tablet; Refill: 0 -     AMB Referral [facility]BCI Care Management     Follow-up: Return in about 3 months (around 04/24/2024).  Debby Molt, MD "

## 2024-01-26 ENCOUNTER — Telehealth: Payer: Self-pay | Admitting: *Deleted

## 2024-01-26 NOTE — Progress Notes (Signed)
 Complex Care Management Note Care Guide Note  01/26/2024 Name: Keith Bullock MRN: [id number] DOB: [birth year]   Complex Care Management Outreach Attempts: An unsuccessful telephone outreach was attempted today to offer the patient information about available complex care management services.  Follow Up Plan:  Additional outreach attempts will be made to offer the patient complex care management information and services.   Encounter Outcome:  No Answer  Thedford Franks, CMA, AAMA [place]  [place], [facility] Guide, Lead Direct Dial: [phone]  Fax: [phone]

## 2024-01-28 NOTE — Progress Notes (Unsigned)
 Complex Care Management Note Care Guide Note  01/28/2024 Name: Keith Bullock MRN: [id number] DOB: 11/16/1960   Complex Care Management Outreach Attempts: A second unsuccessful outreach was attempted today to offer the patient with information about available complex care management services.  Follow Up Plan:  Additional outreach attempts will be made to offer the patient complex care management information and services.   Encounter Outcome:  No Answer  Doyce [facility]  [facility], [facility] Guide Direct Dial:   Fax: [phone]

## 2024-01-31 ENCOUNTER — Other Ambulatory Visit: Payer: Self-pay | Admitting: Urology

## 2024-03-24 ENCOUNTER — Ambulatory Visit (HOSPITAL_COMMUNITY): Admit: 2024-03-24 | Admitting: Urology
